# Patient Record
Sex: Male | Born: 1965 | Race: White | Hispanic: No | Smoking: Never smoker
Health system: Southern US, Community
[De-identification: ages and names within clinical notes are randomized; demographics above are authoritative.]

## PROBLEM LIST (undated history)

## (undated) DIAGNOSIS — D509 Iron deficiency anemia, unspecified: Secondary | ICD-10-CM

## (undated) DIAGNOSIS — C801 Malignant (primary) neoplasm, unspecified: Secondary | ICD-10-CM

## (undated) DIAGNOSIS — K4020 Bilateral inguinal hernia, without obstruction or gangrene, not specified as recurrent: Secondary | ICD-10-CM

## (undated) DIAGNOSIS — D649 Anemia, unspecified: Secondary | ICD-10-CM

## (undated) HISTORY — PX: EYE SURGERY: SHX253

## (undated) HISTORY — DX: Iron deficiency anemia, unspecified: D50.9

## (undated) HISTORY — PX: HERNIA REPAIR: SHX51

---
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
## 2000-04-27 ENCOUNTER — Encounter: Payer: Self-pay | Admitting: Family Medicine

## 2000-04-27 ENCOUNTER — Encounter: Admission: RE | Admit: 2000-04-27 | Discharge: 2000-04-27 | Payer: Self-pay | Admitting: Family Medicine

## 2007-05-14 ENCOUNTER — Emergency Department (HOSPITAL_COMMUNITY): Admission: EM | Admit: 2007-05-14 | Discharge: 2007-05-14 | Payer: Self-pay | Admitting: Emergency Medicine

## 2011-04-27 NOTE — Consult Note (Signed)
Paul Mason, Paul Mason              ACCOUNT NO.:  [id number]   MEDICAL RECORD NO.:  [id number]          PATIENT TYPE:  EMS   LOCATION:  [facility]                         FACILITY:  [facility]   PHYSICIAN:  Andres Shad. Rudean Curt, MD     DATE OF BIRTH:  06-01-1966   DATE OF CONSULTATION:  05/14/2007  DATE OF DISCHARGE:                                 CONSULTATION   CHIEF COMPLAINT:  Chest pain.   HISTORY OF PRESENT ILLNESS:  Mr. Schleifer is a previously well 45 year old  male who presented to the emergency department complaining of chest  pain.  He was running this morning, and after about 40 minutes, he began  to experience some mild chest tightness associated with a cough and some  discomfort in his throat.  Several minutes later, he developed severe  chest tightness with worse pain in his throat and felt like he was  coughing up clear liquid.  His pain lasted for approximately an hour and  was associated with some degree of shortness of breath.  He presented to  the emergency department for further evaluation and the pain began to  subside at that point.  It was particularly alleviated by a dose of  morphine and Zofran that were administered in the emergency department.   REVIEW OF SYSTEMS:  Negative for radiation of pain.  Negative for  vomiting.  Positive for nausea.  Indeterminate for diaphoresis, because  the patient was running.  Negative for chills.  Positive for mild  abdominal pain which the patient has been developing for 2-3 days.  Positive for cough which the patient has also been developing for the  last 2-3 days.   PAST MEDICAL HISTORY:  Negative.  The patient was screened for  hypercholesterolemia a year ago, and this was negative.  He does not  have a history of hypertension or diabetes.  He does not have a history  of asthma.   FAMILY HISTORY:  Both of his grandmothers had a stroke in their 61s.  There was no history of coronary artery disease in the family at all at  any age.   SOCIAL HISTORY:  No smoking, social drinking, no drug use.  The patient  drinks a lot of caffeine, but he has not had any caffeinated beverages  today.   PHYSICAL EXAMINATION:  VITAL SIGNS:  Afebrile, pulse 84, blood pressure  in the left arm 114/77, blood pressure in the right arm 112/77, oxygen  saturation 96% on room air.  GENERAL:  Well-appearing male who appeared his stated age.  HEENT:  Mucous membranes moist.  Oropharynx was quite erythematous.  There was no adenopathy.  NECK:  Supple.  LUNGS:  Clear to auscultation bilaterally.  CARDIOVASCULAR:  Regular, normal S1, S2.  No murmurs, rubs or gallops.  ABDOMEN:  Normal bowel sounds, soft, mildly tender in the epigastrium  and right upper quadrant.  No organomegaly.  EXTREMITIES:  Warm, well perfused.  No edema.   STUDIES:  Two sets of cardiac markers were negative.  Chest x-ray showed  no acute disease.  Serum electrolytes were negative.  Electrocardiogram  showed  normal sinus rhythm.   IMPRESSION:  This is a 45 year old male who has no cardiac risk factors  who presents with atypical chest pain in the context of what appears to  be a viral infection.  It is possible that he is having some  gastroesophageal reflux in association with abdominal pain and mild  cough.   PLAN:  He developed tightness in his chest and reflux-type symptoms  while running.  At this point, I will give the patient a prescription  for Prilosec 20 mg to be taken twice daily.  I have instructed him to  follow up with Dr. Nicholos Johns in the office tomorrow and to return to medical  attention if his symptoms become more severe.      Andres Shad. Rudean Curt, MD  Electronically Signed     PML/MEDQ  D:  05/14/2007  T:  05/14/2007  Job:  [id number]   cc:   Molly Maduro A. Nicholos Johns, M.D.

## 2011-09-30 LAB — I-STAT 8, (EC8 V) (CONVERTED LAB)
Acid-Base Excess: 1
BUN: 20
Bicarbonate: 26.8 — ABNORMAL HIGH
Chloride: 107
Glucose, Bld: 102 — ABNORMAL HIGH
HCT: 45
Hemoglobin: 15.3
Operator id: 270111
Potassium: 4.2
Sodium: 142
TCO2: 28
pCO2, Ven: 46
pH, Ven: 7.373 — ABNORMAL HIGH

## 2011-09-30 LAB — POCT CARDIAC MARKERS
CKMB, poc: 1.3
CKMB, poc: 1.3
Myoglobin, poc: 64.7
Myoglobin, poc: 97.1
Operator id: 196461
Operator id: 270111
Troponin i, poc: <0.05
Troponin i, poc: <0.05

## 2011-09-30 LAB — POCT I-STAT CREATININE
Creatinine, Ser: 1
Operator id: 270111

## 2011-09-30 LAB — D-DIMER, QUANTITATIVE: D-Dimer, Quant: 0.55 — ABNORMAL HIGH

## 2017-03-10 ENCOUNTER — Other Ambulatory Visit: Payer: Self-pay | Admitting: General Surgery

## 2017-06-03 NOTE — Patient Instructions (Addendum)
TAHER VANNOTE  06/03/2017   Your procedure is scheduled on: 06-08-17   Report to [facility] Main  Entrance Take [place] Elevators to 3rd floor to [place] at 6:30 AM.   Call this number if you have problems the morning of surgery [phone]    Remember: ONLY 1 PERSON MAY GO WITH YOU TO SHORT STAY TO GET  READY MORNING OF [place].  Do not eat food or drink liquids :After Midnight.     Take these medicines the morning of surgery with A SIP OF WATER: None                                You may not have any metal on your body including hair pins and              piercings  Do not wear jewelry, make-up, lotions, powders or perfumes, deodorant                          Men may shave face and neck.   Do not bring valuables to the hospital. [place].  Contacts, dentures or bridgework may not be worn into surgery.       Patients discharged the day of surgery will not be allowed to drive home.  Name and phone number of your driver: Mom-Peggy [phone]               Please read over the following fact sheets you were given: _____________________________________________________________________             [facility] - Preparing for Surgery Before surgery, you can play an important role.  Because skin is not sterile, your skin needs to be as free of germs as possible.  You can reduce the number of germs on your skin by washing with CHG (chlorahexidine gluconate) soap before surgery.  CHG is an antiseptic cleaner which kills germs and bonds with the skin to continue killing germs even after washing. Please DO NOT use if you have an allergy to CHG or antibacterial soaps.  If your skin becomes reddened/irritated stop using the CHG and inform your nurse when you arrive at Short Stay. Do not shave (including legs and underarms) for at least 48 hours prior to the first CHG shower.  You may shave your  face/neck. Please follow these instructions carefully:  1.  Shower with CHG Soap the night before surgery and the  morning of Surgery.  2.  If you choose to wash your hair, wash your hair first as usual with your  normal  shampoo.  3.  After you shampoo, rinse your hair and body thoroughly to remove the  shampoo.                           4.  Use CHG as you would any other liquid soap.  You can apply chg directly  to the skin and wash                       Gently with a scrungie or clean washcloth.  5.  Apply the CHG Soap to your body  ONLY FROM THE NECK DOWN.   Do not use on face/ open                           Wound or open sores. Avoid contact with eyes, ears mouth and genitals (private parts).                       Wash face,  Genitals (private parts) with your normal soap.             6.  Wash thoroughly, paying special attention to the area where your surgery  will be performed.  7.  Thoroughly rinse your body with warm water from the neck down.  8.  DO NOT shower/wash with your normal soap after using and rinsing off  the CHG Soap.                9.  Pat yourself dry with a clean towel.            10.  Wear clean pajamas.            11.  Place clean sheets on your bed the night of your first shower and do not  sleep with pets. Day of Surgery : Do not apply any lotions/deodorants the morning of surgery.  Please wear clean clothes to the hospital/surgery center.  FAILURE TO FOLLOW THESE INSTRUCTIONS MAY RESULT IN THE CANCELLATION OF YOUR SURGERY PATIENT SIGNATURE_________________________________  NURSE SIGNATURE__________________________________  ________________________________________________________________________

## 2017-06-05 NOTE — H&P (Signed)
[name] Location: [facility] Patient #: [id number] DOB: 04-27-1966 Single / Language: Cleophus Molt / Race: White Male       History of Present Illness       The patient is a 51 year old male who presents with a complaint of bilateral inguinal hernias. This is a very pleasant 51 year old gentleman, referred by Dr. Natividad Brood for a symptomatic right inguinal hernia. On exam he actually has bilateral inguinal hernias      He gives a 3 to four-month history of a right groin bulge and some brief shooting pains. No history of incarceration. No prior hernia surgery. Only prior surgery he's had his vasectomy.      Comorbidities are minimal. No major medical problems. Vasectomy only Family history reveals mother living with coronary artery disease and CABG. Father died in a farming accident crushed by his tractor. Social history reveals he is single. Lives in [place]. Has 2 children. 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 Denies tobacco. Drinks alcohol occasionally     We had a long discussion about his hernias. I told him he had bilateral inguinal hernias and that repair would be required at some point electively in the future. Since he's becoming symptomatic he's thinking he wants to do this in the summer when he is out of school and that would be appropriate. When a long talk about laparoscopic technique and open technique. I told him that he is an appropriate candidate for both and I couldn't do it either way. I discussed the differences in an short-term pain, recurrence rates and technique in general. Disability from sports would be 4-5 weeks and he understands all of this. He says he can do this in the summer. Right now his preference is bilateral open repair. If he changes his mind that would be fine.      We will schedule him for bilateral inguinal hernia repair, open technique, implantation of mesh probably in late June or early July.  Bilateral TAPP blocks I discussed the indications, details, techniques, and numerous risk of the surgery with him. He is aware of the risk of bleeding, infection, recurrence, nerve damage with chronic pain, injury to the testicle or bladder or bowel, urinary retention and other unforeseen problems. He understands these issues well. All of his questions are answered. He agrees with this plan.   Allergies PenicillAMINE *MISCELLANEOUS THERAPEUTIC CLASSES*  Hives. Allergies Reconciled   Medication History No Current Medications Medications Reconciled  Vitals  Weight: 208.6 lb Height: 69in Body Surface Area: 2.1 m Body Mass Index: 30.8 kg/m  Temp.: 98.33F  Pulse: 70 (Regular)  BP: 122/72 (Sitting, Left Arm, Standard)    Physical Exam  General Mental Status-Alert. General Appearance-Consistent with stated age. Hydration-Well hydrated. Voice-Normal.  Head and Neck Head-normocephalic, atraumatic with no lesions or palpable masses. Trachea-midline. Thyroid Gland Characteristics - normal size and consistency.  Eye Eyeball - Bilateral-Extraocular movements intact. Sclera/Conjunctiva - Bilateral-No scleral icterus.  Chest and Lung Exam Chest and lung exam reveals -quiet, even and easy respiratory effort with no use of accessory muscles and on auscultation, normal breath sounds, no adventitious sounds and normal vocal resonance. Inspection Chest Wall - Normal. Back - normal.  Cardiovascular Cardiovascular examination reveals -normal heart sounds, regular rate and rhythm with no murmurs and normal pedal pulses bilaterally.  Abdomen Inspection Inspection of the abdomen reveals - No Hernias. Skin - Scar - no surgical scars. Palpation/Percussion Palpation and Percussion of the abdomen reveal - Soft,  Non Tender, No Rebound tenderness, No Rigidity (guarding) and No hepatosplenomegaly. Auscultation Auscultation of the abdomen reveals - Bowel  sounds normal. Note: Umbilicus normal.   Male Genitourinary Note: Bilateral inguinal hernias. These are obvious but not large. They do not extend past the external inguinal ring. Nontender. Reducible. No adenopathy. Penis scrotum and testes are normal. No scrotal mass.   Neurologic Neurologic evaluation reveals -alert and oriented x 3 with no impairment of recent or remote memory. Mental Status-Normal.  Musculoskeletal Normal Exam - Left-Upper Extremity Strength Normal and Lower Extremity Strength Normal. Normal Exam - Right-Upper Extremity Strength Normal and Lower Extremity Strength Normal.  Lymphatic Head & Neck  General Head & Neck Lymphatics: Bilateral - Description - Normal. Axillary  General Axillary Region: Bilateral - Description - Normal. Tenderness - Non Tender. Femoral & Inguinal  Generalized Femoral & Inguinal Lymphatics: Bilateral - Description - Normal. Tenderness - Non Tender.    Assessment & Plan  BILATERAL INGUINAL HERNIA WITHOUT OBSTRUCTION OR GANGRENE, RECURRENCE NOT SPECIFIED (K40.20)  You have noticed a bulge and some intermittent pain in your right groin On examination you actually have bilateral inguinal hernias These are reducible so there is no emergency We have discussed the techniques of open hernia repair and laparoscopic repair We have compare the differences in pain and recurrence rates Temporary disability is about 4-5 weeks regardless of technique I am happy to perform either technique.  you stated that you would like to have this surgery performed At this time you're a candidate for either technique You have stated that you would like to have open repairs because of the lower recurrence rate, and that is appropriate  Please read the patient information booklet that I gave you    [name]. Dalbert Batman, M.D., [facility] General and Minimally invasive Surgery Breast and Colorectal Surgery Office:    [phone] Pager:   [phone]

## 2017-06-06 ENCOUNTER — Encounter (HOSPITAL_COMMUNITY): Payer: Self-pay

## 2017-06-06 ENCOUNTER — Encounter (HOSPITAL_COMMUNITY)
Admission: RE | Admit: 2017-06-06 | Discharge: 2017-06-06 | Disposition: A | Discharge status: Home / Self Care | Payer: BC Managed Care – PPO | Source: Ambulatory Visit | Attending: General Surgery | Admitting: General Surgery

## 2017-06-06 DIAGNOSIS — Z8249 Family history of ischemic heart disease and other diseases of the circulatory system: Secondary | ICD-10-CM | POA: Diagnosis not present

## 2017-06-06 DIAGNOSIS — Z880 Allergy status to penicillin: Secondary | ICD-10-CM | POA: Diagnosis not present

## 2017-06-06 DIAGNOSIS — Z9852 Vasectomy status: Secondary | ICD-10-CM | POA: Diagnosis not present

## 2017-06-06 DIAGNOSIS — K4020 Bilateral inguinal hernia, without obstruction or gangrene, not specified as recurrent: Secondary | ICD-10-CM | POA: Diagnosis not present

## 2017-06-06 LAB — COMPREHENSIVE METABOLIC PANEL
ALK PHOS: 38 U/L (ref 38–126)
ALT: 14 U/L — ABNORMAL LOW (ref 17–63)
ANION GAP: 8 (ref 5–15)
AST: 18 U/L (ref 15–41)
Albumin: 4.2 g/dL (ref 3.5–5.0)
BUN: 24 mg/dL — ABNORMAL HIGH (ref 6–20)
CALCIUM: 8.4 mg/dL — AB (ref 8.9–10.3)
CO2: 26 mmol/L (ref 22–32)
Chloride: 106 mmol/L (ref 101–111)
Creatinine, Ser: 1.27 mg/dL — ABNORMAL HIGH (ref 0.61–1.24)
GFR calc non Af Amer: >60 mL/min (ref >60)
Glucose, Bld: 114 mg/dL — ABNORMAL HIGH (ref 65–99)
Potassium: 4.4 mmol/L (ref 3.5–5.1)
SODIUM: 140 mmol/L (ref 135–145)
Total Bilirubin: 0.5 mg/dL (ref 0.3–1.2)
Total Protein: 7 g/dL (ref 6.5–8.1)

## 2017-06-06 LAB — CBC WITH DIFFERENTIAL/PLATELET
Basophils Absolute: 0 10*3/uL (ref 0.0–0.1)
Basophils Relative: 0 %
EOS ABS: 0.2 10*3/uL (ref 0.0–0.7)
EOS PCT: 4 %
HCT: 42.1 % (ref 39.0–52.0)
HEMOGLOBIN: 14.7 g/dL (ref 13.0–17.0)
LYMPHS ABS: 0.7 10*3/uL (ref 0.7–4.0)
Lymphocytes Relative: 13 %
MCH: 29.1 pg (ref 26.0–34.0)
MCHC: 34.9 g/dL (ref 30.0–36.0)
MCV: 83.4 fL (ref 78.0–100.0)
MONOS PCT: 6 %
Monocytes Absolute: 0.3 10*3/uL (ref 0.1–1.0)
Neutro Abs: 4 10*3/uL (ref 1.7–7.7)
Neutrophils Relative %: 77 %
PLATELETS: 134 10*3/uL — AB (ref 150–400)
RBC: 5.05 MIL/uL (ref 4.22–5.81)
RDW: 13.2 % (ref 11.5–15.5)
WBC: 5.2 10*3/uL (ref 4.0–10.5)

## 2017-06-06 NOTE — Progress Notes (Signed)
06-06-17 CMP routed to Dr. Dalbert Batman for review.

## 2017-06-08 ENCOUNTER — Ambulatory Visit (HOSPITAL_COMMUNITY): Payer: BC Managed Care – PPO | Admitting: Anesthesiology

## 2017-06-08 ENCOUNTER — Ambulatory Visit (HOSPITAL_COMMUNITY)
Admission: RE | Admit: 2017-06-08 | Discharge: 2017-06-08 | Disposition: A | Discharge status: Home / Self Care | Payer: BC Managed Care – PPO | Source: Ambulatory Visit | Attending: General Surgery | Admitting: General Surgery

## 2017-06-08 ENCOUNTER — Encounter (HOSPITAL_COMMUNITY)
Admission: RE | Disposition: A | Discharge status: Home / Self Care | Payer: Self-pay | Source: Ambulatory Visit | Attending: General Surgery

## 2017-06-08 ENCOUNTER — Encounter (HOSPITAL_COMMUNITY): Payer: Self-pay | Admitting: *Deleted

## 2017-06-08 DIAGNOSIS — Z9852 Vasectomy status: Secondary | ICD-10-CM | POA: Insufficient documentation

## 2017-06-08 DIAGNOSIS — K4020 Bilateral inguinal hernia, without obstruction or gangrene, not specified as recurrent: Secondary | ICD-10-CM | POA: Diagnosis not present

## 2017-06-08 DIAGNOSIS — Z8249 Family history of ischemic heart disease and other diseases of the circulatory system: Secondary | ICD-10-CM | POA: Insufficient documentation

## 2017-06-08 DIAGNOSIS — Z880 Allergy status to penicillin: Secondary | ICD-10-CM | POA: Insufficient documentation

## 2017-06-08 HISTORY — PX: INSERTION OF MESH: SHX5868

## 2017-06-08 HISTORY — PX: INGUINAL HERNIA REPAIR: SHX194

## 2017-06-08 HISTORY — DX: Bilateral inguinal hernia, without obstruction or gangrene, not specified as recurrent: K40.20

## 2017-06-08 SURGERY — REPAIR, HERNIA, INGUINAL, BILATERAL, ADULT
Anesthesia: General | Laterality: Bilateral

## 2017-06-08 MED ORDER — ROCURONIUM BROMIDE 50 MG/5ML IV SOSY
PREFILLED_SYRINGE | INTRAVENOUS | Status: AC
  Filled 2017-06-08: qty 10

## 2017-06-08 MED ORDER — MIDAZOLAM HCL 2 MG/2ML IJ SOLN
INTRAMUSCULAR | Status: AC
  Filled 2017-06-08: qty 2

## 2017-06-08 MED ORDER — PROPOFOL 10 MG/ML IV BOLUS
INTRAVENOUS | Status: DC | PRN
  Administered 2017-06-08: 50 mg via INTRAVENOUS
  Administered 2017-06-08: 150 mg via INTRAVENOUS

## 2017-06-08 MED ORDER — DEXAMETHASONE SODIUM PHOSPHATE 4 MG/ML IJ SOLN
INTRAMUSCULAR | Status: DC | PRN
  Administered 2017-06-08: 10 mg via INTRAVENOUS

## 2017-06-08 MED ORDER — ROCURONIUM BROMIDE 50 MG/5ML IV SOSY
PREFILLED_SYRINGE | INTRAVENOUS | Status: AC
  Filled 2017-06-08: qty 5

## 2017-06-08 MED ORDER — HYDROCODONE-ACETAMINOPHEN 5-325 MG PO TABS: 1.0000 | ORAL_TABLET | ORAL | 0 refills | Status: DC | PRN

## 2017-06-08 MED ORDER — SUGAMMADEX SODIUM 200 MG/2ML IV SOLN
INTRAVENOUS | Status: AC
  Filled 2017-06-08: qty 2

## 2017-06-08 MED ORDER — 0.9 % SODIUM CHLORIDE (POUR BTL) OPTIME
TOPICAL | Status: DC | PRN
  Administered 2017-06-08: 1000 mL

## 2017-06-08 MED ORDER — LIDOCAINE-EPINEPHRINE 1 %-1:100000 IJ SOLN
INTRAMUSCULAR | Status: AC
  Filled 2017-06-08: qty 1

## 2017-06-08 MED ORDER — CEFAZOLIN SODIUM-DEXTROSE 2-4 GM/100ML-% IV SOLN
2.0000 g | INTRAVENOUS | Status: AC
  Administered 2017-06-08: 2 g via INTRAVENOUS
  Filled 2017-06-08: qty 100

## 2017-06-08 MED ORDER — SUGAMMADEX SODIUM 200 MG/2ML IV SOLN
INTRAVENOUS | Status: DC | PRN
  Administered 2017-06-08: 200 mg via INTRAVENOUS

## 2017-06-08 MED ORDER — MIDAZOLAM HCL 5 MG/5ML IJ SOLN
INTRAMUSCULAR | Status: DC | PRN
  Administered 2017-06-08 (×4): 1 mg via INTRAVENOUS

## 2017-06-08 MED ORDER — MEPERIDINE HCL 50 MG/ML IJ SOLN: 6.2500 mg | INTRAMUSCULAR | Status: DC | PRN

## 2017-06-08 MED ORDER — ONDANSETRON HCL 4 MG/2ML IJ SOLN
INTRAMUSCULAR | Status: AC
  Filled 2017-06-08: qty 2

## 2017-06-08 MED ORDER — PROPOFOL 10 MG/ML IV BOLUS
INTRAVENOUS | Status: AC
  Filled 2017-06-08: qty 20

## 2017-06-08 MED ORDER — ROCURONIUM BROMIDE 50 MG/5ML IV SOSY
PREFILLED_SYRINGE | INTRAVENOUS | Status: DC | PRN
  Administered 2017-06-08: 50 mg via INTRAVENOUS
  Administered 2017-06-08: 10 mg via INTRAVENOUS
  Administered 2017-06-08: 20 mg via INTRAVENOUS
  Administered 2017-06-08: 30 mg via INTRAVENOUS

## 2017-06-08 MED ORDER — LACTATED RINGERS IV SOLN
INTRAVENOUS | Status: DC
  Administered 2017-06-08 (×2): via INTRAVENOUS

## 2017-06-08 MED ORDER — LACTATED RINGERS IV SOLN: INTRAVENOUS | Status: DC

## 2017-06-08 MED ORDER — PROMETHAZINE HCL 25 MG/ML IJ SOLN: 6.2500 mg | INTRAMUSCULAR | Status: DC | PRN

## 2017-06-08 MED ORDER — FENTANYL CITRATE (PF) 100 MCG/2ML IJ SOLN
INTRAMUSCULAR | Status: DC | PRN
  Administered 2017-06-08: 100 ug via INTRAVENOUS
  Administered 2017-06-08 (×6): 50 ug via INTRAVENOUS

## 2017-06-08 MED ORDER — HYDROMORPHONE HCL 1 MG/ML IJ SOLN
0.2500 mg | INTRAMUSCULAR | Status: DC | PRN
  Filled 2017-06-08: qty 0.5

## 2017-06-08 MED ORDER — BUPIVACAINE-EPINEPHRINE (PF) 0.5% -1:200000 IJ SOLN
INTRAMUSCULAR | Status: DC | PRN
  Administered 2017-06-08 (×2): 25 mL

## 2017-06-08 MED ORDER — FENTANYL CITRATE (PF) 250 MCG/5ML IJ SOLN
INTRAMUSCULAR | Status: AC
  Filled 2017-06-08: qty 5

## 2017-06-08 MED ORDER — BUPIVACAINE-EPINEPHRINE (PF) 0.5% -1:200000 IJ SOLN
INTRAMUSCULAR | Status: AC
  Filled 2017-06-08: qty 30

## 2017-06-08 MED ORDER — BUPIVACAINE-EPINEPHRINE 0.5% -1:200000 IJ SOLN
INTRAMUSCULAR | Status: DC | PRN
  Administered 2017-06-08: 7 mL

## 2017-06-08 MED ORDER — OXYCODONE HCL 5 MG PO TABS
5.0000 mg | ORAL_TABLET | Freq: Once | ORAL | Status: AC
  Administered 2017-06-08: 5 mg via ORAL
  Filled 2017-06-08: qty 1

## 2017-06-08 MED ORDER — FENTANYL CITRATE (PF) 100 MCG/2ML IJ SOLN
INTRAMUSCULAR | Status: AC
  Filled 2017-06-08: qty 2

## 2017-06-08 MED ORDER — DEXAMETHASONE SODIUM PHOSPHATE 10 MG/ML IJ SOLN
INTRAMUSCULAR | Status: AC
  Filled 2017-06-08: qty 1

## 2017-06-08 MED ORDER — ONDANSETRON HCL 4 MG/2ML IJ SOLN
INTRAMUSCULAR | Status: DC | PRN
Start: 2017-06-08 — End: 2017-06-08
  Administered 2017-06-08: 4 mg via INTRAVENOUS

## 2017-06-08 SURGICAL SUPPLY — 40 items
BENZOIN TINCTURE PRP APPL 2/3 (GAUZE/BANDAGES/DRESSINGS) ×3 IMPLANT
BLADE HEX COATED 2.75 (ELECTRODE) ×3 IMPLANT
BLADE SURG 15 STRL LF DISP TIS (BLADE) ×1 IMPLANT
BLADE SURG 15 STRL SS (BLADE) ×2
CLOSURE WOUND 1/2 X4 (GAUZE/BANDAGES/DRESSINGS) ×1
COVER SURGICAL LIGHT HANDLE (MISCELLANEOUS) ×3 IMPLANT
DECANTER SPIKE VIAL GLASS SM (MISCELLANEOUS) ×3 IMPLANT
DERMABOND ADVANCED (GAUZE/BANDAGES/DRESSINGS) ×2
DERMABOND ADVANCED .7 DNX12 (GAUZE/BANDAGES/DRESSINGS) ×1 IMPLANT
DRAIN PENROSE 18X1/2 LTX STRL (DRAIN) ×3 IMPLANT
DRAPE LAPAROTOMY TRNSV 102X78 (DRAPE) ×3 IMPLANT
ELECT PENCIL ROCKER SW 15FT (MISCELLANEOUS) ×3 IMPLANT
ELECT REM PT RETURN 15FT ADLT (MISCELLANEOUS) ×3 IMPLANT
GAUZE SPONGE 4X4 12PLY STRL (GAUZE/BANDAGES/DRESSINGS) ×3 IMPLANT
GLOVE EUDERMIC 7 POWDERFREE (GLOVE) ×3 IMPLANT
GOWN STRL REUS W/TWL XL LVL3 (GOWN DISPOSABLE) ×6 IMPLANT
KIT BASIN OR (CUSTOM PROCEDURE TRAY) ×3 IMPLANT
MESH ULTRAPRO 3X6 7.6X15CM (Mesh General) ×6 IMPLANT
NEEDLE HYPO 25X1 1.5 SAFETY (NEEDLE) ×3 IMPLANT
PACK BASIC VI WITH GOWN DISP (CUSTOM PROCEDURE TRAY) ×3 IMPLANT
SPONGE LAP 4X18 X RAY DECT (DISPOSABLE) ×3 IMPLANT
STAPLER VISISTAT 35W (STAPLE) IMPLANT
STRIP CLOSURE SKIN 1/2X4 (GAUZE/BANDAGES/DRESSINGS) ×2 IMPLANT
SUT MNCRL AB 4-0 PS2 18 (SUTURE) ×6 IMPLANT
SUT PROLENE 2 0 CT2 30 (SUTURE) ×18 IMPLANT
SUT SILK 2 0 (SUTURE) ×2
SUT SILK 2 0 SH (SUTURE) IMPLANT
SUT SILK 2-0 18XBRD TIE 12 (SUTURE) ×1 IMPLANT
SUT VIC AB 2-0 SH 27 (SUTURE) ×12
SUT VIC AB 2-0 SH 27X BRD (SUTURE) ×6 IMPLANT
SUT VIC AB 3-0 SH 18 (SUTURE) ×3 IMPLANT
SUT VIC AB 3-0 SH 27 (SUTURE) ×6
SUT VIC AB 3-0 SH 27XBRD (SUTURE) ×3 IMPLANT
SUT VICRYL 2 0 18  UND BR (SUTURE)
SUT VICRYL 2 0 18 UND BR (SUTURE) IMPLANT
SYR 20CC LL (SYRINGE) ×3 IMPLANT
SYR BULB IRRIGATION 50ML (SYRINGE) ×3 IMPLANT
TOWEL OR 17X26 10 PK STRL BLUE (TOWEL DISPOSABLE) ×3 IMPLANT
TOWEL OR NON WOVEN STRL DISP B (DISPOSABLE) ×3 IMPLANT
YANKAUER SUCT BULB TIP 10FT TU (MISCELLANEOUS) ×3 IMPLANT

## 2017-06-08 NOTE — Anesthesia Procedure Notes (Signed)
Anesthesia Regional Block: TAP block   Pre-Anesthetic Checklist: ,, timeout performed, Correct Patient, Correct Site, Correct Laterality, Correct Procedure, Correct Position, site marked, Risks and benefits discussed,  Surgical consent,  Pre-op evaluation,  At surgeon's request and post-op pain management  Laterality: Left  Prep: chloraprep       Needles:  Injection technique: Single-shot  Needle Type: Echogenic Needle     Needle Length: 9cm  Needle Gauge: 21     Additional Needles:   Procedures: ultrasound guided,,,,,,,,  Narrative:  Start time: 06/08/2017 8:10 AM End time: 06/08/2017 8:15 AM Injection made incrementally with aspirations every 5 mL.  Performed by: Personally  Anesthesiologist: Suella Broad D  Additional Notes: Tolerated well.

## 2017-06-08 NOTE — Discharge Instructions (Signed)
General Anesthesia, Adult, Care After These instructions provide you with information about caring for yourself after your procedure. Your health care provider may also give you more specific instructions. Your treatment has been planned according to current medical practices, but problems sometimes occur. Call your health care provider if you have any problems or questions after your procedure. What can I expect after the procedure? After the procedure, it is common to have:  Vomiting.  A sore throat.  Mental slowness.  It is common to feel:  Nauseous.  Cold or shivery.  Sleepy.  Tired.  Sore or achy, even in parts of your body where you did not have surgery.  Follow these instructions at home: For at least 24 hours after the procedure:  Do not: ? Participate in activities where you could fall or become injured. ? Drive. ? Use heavy machinery. ? Drink alcohol. ? Take sleeping pills or medicines that cause drowsiness. ? Make important decisions or sign legal documents. ? Take care of children on your own.  Rest. Eating and drinking  If you vomit, drink water, juice, or soup when you can drink without vomiting.  Drink enough fluid to keep your urine clear or pale yellow.  Make sure you have little or no nausea before eating solid foods.  Follow the diet recommended by your health care provider. General instructions  Have a responsible adult stay with you until you are awake and alert.  Return to your normal activities as told by your health care provider. Ask your health care provider what activities are safe for you.  Take over-the-counter and prescription medicines only as told by your health care provider.  If you smoke, do not smoke without supervision.  Keep all follow-up visits as told by your health care provider. This is important. Contact a health care provider if:  You continue to have nausea or vomiting at home, and medicines are not helpful.  You  cannot drink fluids or start eating again.  You cannot urinate after 8-12 hours.  You develop a skin rash.  You have fever.  You have increasing redness at the site of your procedure. Get help right away if:  You have difficulty breathing.  You have chest pain.  You have unexpected bleeding.  You feel that you are having a life-threatening or urgent problem. This information is not intended to replace advice given to you by your health care provider. Make sure you discuss any questions you have with your health care provider. Document Released: 03/07/2001 Document Revised: 05/03/2016 Document Reviewed: 11/13/2015 Elsevier Interactive Patient Education  2018 [place] _______[facility]  UMBILICAL OR INGUINAL HERNIA REPAIR: POST OP INSTRUCTIONS  Always review your discharge instruction sheet given to you by the facility where your surgery was performed. IF YOU HAVE DISABILITY OR FAMILY LEAVE FORMS, YOU MUST BRING THEM TO THE OFFICE FOR PROCESSING.   DO NOT GIVE THEM TO YOUR DOCTOR.  1. A  prescription for pain medication may be given to you upon discharge.  Take your pain medication as prescribed, if needed.  If narcotic pain medicine is not needed, then you may take acetaminophen (Tylenol) or ibuprofen (Advil) as needed. 2. Take your usually prescribed medications unless otherwise directed. If you need a refill on your pain medication, please contact your pharmacy.  They will contact our office to request authorization. Prescriptions will not be filled after 5 pm or on week-ends. 3. You should follow a light diet the first 24 hours  after arrival home, such as soup and crackers, etc.  Be sure to include lots of fluids daily.  Resume your normal diet the day after surgery. 4.Most patients will experience some swelling and bruising around the umbilicus or in the groin and scrotum.  Ice packs and reclining will help.  Swelling and bruising can take several days  to resolve.  6. It is common to experience some constipation if taking pain medication after surgery.  Increasing fluid intake and taking a stool softener (such as Colace) will usually help or prevent this problem from occurring.  A mild laxative (Milk of Magnesia or Miralax) should be taken according to package directions if there are no bowel movements after 48 hours. 7. Unless discharge instructions indicate otherwise, you may remove your bandages 24-48 hours after surgery, and you may shower at that time.  You may have steri-strips (small skin tapes) in place directly over the incision.  These strips should be left on the skin for 7-10 days.  If your surgeon used skin glue on the incision, you may shower in 24 hours.  The glue will flake off over the next 2-3 weeks.  Any sutures or staples will be removed at the office during your follow-up visit. 8. ACTIVITIES:  You may resume regular (light) daily activities beginning the next day--such as daily self-care, walking, climbing stairs--gradually increasing activities as tolerated.  You may have sexual intercourse when it is comfortable.  Refrain from any heavy lifting or straining until approved by your doctor.  a.You may drive when you are no longer taking prescription pain medication, you can comfortably wear a seatbelt, and you can safely maneuver your car and apply brakes. b.RETURN TO WORK:   _____________________________________________  9.You should see your doctor in the office for a follow-up appointment approximately 2-3 weeks after your surgery.  Make sure that you call for this appointment within a day or two after you arrive home to insure a convenient appointment time. 10.OTHER INSTRUCTIONS: _____No sports or heavy lifting more than 20 pounds for 5 weeks.  .No driving for 0-27 days .take a stool softener twice a day to avoid constipation ..____________________    _____________________________________  WHEN TO CALL YOUR DOCTOR: 1. Fever  over 101.0 2. Inability to urinate 3. Nausea and/or vomiting 4. Extreme swelling or bruising 5. Continued bleeding from incision. 6. Increased pain, redness, or drainage from the incision  The clinic staff is available to answer your questions during regular business hours.  Please dont hesitate to call and ask to speak to one of the nurses for clinical concerns.  If you have a medical emergency, go to the nearest emergency room or call 911.  A surgeon from [facility] is always on call at the hospital   [address], [place], [place], [place]  [address] ?  [address], [place], [place]   [address] [phone] ? [phone] ? FAX [phone] Web site: [email or web address]

## 2017-06-08 NOTE — Anesthesia Procedure Notes (Signed)
Procedure Name: Intubation Date/Time: 06/08/2017 8:41 AM Performed by: Deliah Boston Pre-anesthesia Checklist: Patient identified, Emergency Drugs available, Suction available and Patient being monitored Patient Re-evaluated:Patient Re-evaluated prior to inductionOxygen Delivery Method: Circle system utilized Preoxygenation: Pre-oxygenation with 100% oxygen (easy mask) Intubation Type: IV induction Ventilation: Mask ventilation without difficulty Laryngoscope Size: Glidescope and 4 Grade View: Grade II Tube type: Oral Tube size: 7.5 mm Number of attempts: 2 Airway Equipment and Method: Stylet,  Oral airway,  Video-laryngoscopy and Rigid stylet Placement Confirmation: ETT inserted through vocal cords under direct vision,  positive ETCO2 and breath sounds checked- equal and bilateral Secured at: 22 (at lip) cm Tube secured with: Tape Dental Injury: Teeth and Oropharynx as per pre-operative assessment  Difficulty Due To: Difficulty was unanticipated and Difficult Airway- due to anterior larynx Comments: Easy mask, DVL x1 by CRNA with mac 4, no view, attempt to place bougie unsuccessful. DVL x 1 by MDA with mac 4 grade 3 view, unsuccessful intubation attempt. Pt ventilated with 100% O2. DVL with glidescope by MDA with grade 2 view. ETT placed, + bil breath sounds, + etco2. ETT secured at 22cm at the lip. Lips, teeth, and oral mucosa as pre op

## 2017-06-08 NOTE — Transfer of Care (Signed)
Immediate Anesthesia Transfer of Care Note  Patient: Paul Mason  Procedure(s) Performed: Procedure(s): OPEN REPAIR BILARTERAL INGUINAL HERNIA WITH MESH (Bilateral) INSERTION OF MESH (Bilateral)  Patient Location: PACU  Anesthesia Type:General and Regional  Level of Consciousness: Patient easily awoken, sedated, comfortable, cooperative, following commands, responds to stimulation.   Airway & Oxygen Therapy: Patient spontaneously breathing, ventilating well, oxygen via simple oxygen mask.  Post-op Assessment: Report given to PACU RN, vital signs reviewed and stable, moving all extremities.   Post vital signs: Reviewed and stable.  Complications: No apparent anesthesia complications  Last Vitals:  Vitals:   06/08/17 0623  BP: 140/87  Pulse: 87  Resp: 18  Temp: 36.6 C    Last Pain:  Vitals:   06/08/17 0753  TempSrc:   PainSc: 0-No pain      Patients Stated Pain Goal: 4 (32/02/33 4356)  Complications: No apparent anesthesia complications

## 2017-06-08 NOTE — Progress Notes (Signed)
Spoke to [place] in pharmacy . Verified to give.

## 2017-06-08 NOTE — Interval H&P Note (Signed)
History and Physical Interval Note:  06/08/2017 8:03 AM  Paul Mason  has presented today for surgery, with the diagnosis of bilateral inguinal hernias  The various methods of treatment have been discussed with the patient and family. After consideration of risks, benefits and other options for treatment, the patient has consented to  Procedure(s): OPEN REPAIR BILARTERAL INGUINAL HERNIA WITH MESH (Bilateral) INSERTION OF MESH (Bilateral) as a surgical intervention .  The patient's history has been reviewed, patient examined, no change in status, stable for surgery.  I have reviewed the patient's chart and labs.  Questions were answered to the patient's satisfaction.     [name]

## 2017-06-08 NOTE — Anesthesia Postprocedure Evaluation (Signed)
Anesthesia Post Note  Patient: Paul Mason  Procedure(s) Performed: Procedure(s) (LRB): OPEN REPAIR BILARTERAL INGUINAL HERNIA WITH MESH (Bilateral) INSERTION OF MESH (Bilateral)     Patient location during evaluation: PACU Anesthesia Type: General and Regional Level of consciousness: awake and alert Pain management: pain level controlled Vital Signs Assessment: post-procedure vital signs reviewed and stable Respiratory status: spontaneous breathing, nonlabored ventilation, respiratory function stable and patient connected to nasal cannula oxygen Cardiovascular status: blood pressure returned to baseline and stable Postop Assessment: no signs of nausea or vomiting Anesthetic complications: no    Last Vitals:  Vitals:   06/08/17 1100 06/08/17 1115  BP: (!) 157/97 (!) 146/90  Pulse: 77 76  Resp: 15 16  Temp:      Last Pain:  Vitals:   06/08/17 1115  TempSrc:   PainSc: 0-No pain                 Effie Berkshire

## 2017-06-08 NOTE — Addendum Note (Signed)
Addendum  created 06/08/17 1331 by Deliah Boston, CRNA   Charge Capture section accepted, Visit diagnoses modified

## 2017-06-08 NOTE — Anesthesia Preprocedure Evaluation (Signed)
Anesthesia Evaluation  Patient identified by MRN, date of birth, ID band Patient awake    Reviewed: Allergy & Precautions, NPO status , Patient's Chart, lab work & pertinent test results  Airway Mallampati: II  TM Distance: <3 FB Neck ROM: Full    Dental  (+) Teeth Intact, Dental Advisory Given   Pulmonary neg pulmonary ROS,    breath sounds clear to auscultation       Cardiovascular negative cardio ROS   Rhythm:Regular Rate:Normal     Neuro/Psych negative neurological ROS  negative psych ROS   GI/Hepatic Neg liver ROS, GERD  Medicated,  Endo/Other  negative endocrine ROS  Renal/GU negative Renal ROS  negative genitourinary   Musculoskeletal negative musculoskeletal ROS (+)   Abdominal   Peds negative pediatric ROS (+)  Hematology negative hematology ROS (+)   Anesthesia Other Findings   Reproductive/Obstetrics negative OB ROS                           Anesthesia Physical Anesthesia Plan  ASA: II  Anesthesia Plan: General   Post-op Pain Management:  Regional for Post-op pain   Induction: Intravenous  PONV Risk Score and Plan: 3 and Ondansetron, Dexamethasone, Propofol and Midazolam  Airway Management Planned: Oral ETT  Additional Equipment:   Intra-op Plan:   Post-operative Plan: Extubation in OR  Informed Consent: I have reviewed the patients History and Physical, chart, labs and discussed the procedure including the risks, benefits and alternatives for the proposed anesthesia with the patient or authorized representative who has indicated his/her understanding and acceptance.   Dental advisory given  Plan Discussed with: CRNA  Anesthesia Plan Comments:         Anesthesia Quick Evaluation

## 2017-06-08 NOTE — Progress Notes (Signed)
Patient states penicillin allergic reaction as a child. Reported to pharmacy

## 2017-06-08 NOTE — Anesthesia Procedure Notes (Signed)
Anesthesia Regional Block: TAP block   Pre-Anesthetic Checklist: ,, timeout performed, Correct Patient, Correct Site, Correct Laterality, Correct Procedure, Correct Position, site marked, Risks and benefits discussed,  Surgical consent,  Pre-op evaluation,  At surgeon's request and post-op pain management  Laterality: Right  Prep: chloraprep       Needles:  Injection technique: Single-shot  Needle Type: Echogenic Needle     Needle Length: 9cm  Needle Gauge: 21     Additional Needles:   Procedures: ultrasound guided,,,,,,,,  Narrative:  Start time: 06/08/2017 8:15 AM End time: 06/08/2017 8:20 AM Injection made incrementally with aspirations every 5 mL.  Performed by: Personally  Anesthesiologist: Suella Broad D  Additional Notes: Tolerated well.

## 2017-06-08 NOTE — Progress Notes (Signed)
AssistedDr. Smith Robert with right, left, ultrasound guided, adductor canal blocks. Side rails up, monitors on throughout procedure. See vital signs in flow sheet. Tolerated Procedure well.

## 2017-06-08 NOTE — Op Note (Signed)
Patient Name:           Paul Mason   Date of Surgery:        06/08/2017  Pre op Diagnosis:      Bilateral inguinal hernias  Post op Diagnosis:    Bilateral inguinal hernias  Procedure:                 Open repair bilateral inguinal hernias with mesh Karl Pock repair)  Surgeon:                     [name]. Dalbert Batman, M.D., FACS  Assistant:                      OR staff   Indication for Assistant: n/a   Operative Indications:   The patient is a 51 year old male who presents with a complaint of bilateral inguinal hernias. This is a very pleasant 51 year old gentleman, referred by Dr. Natividad Brood for a symptomatic right inguinal hernia. On exam he actually has bilateral inguinal hernias      He gives a 3 to four-month history of a right groin bulge and some brief shooting pains. No history of incarceration. No prior hernia surgery. Only prior surgery he's had is vasectomy.     We had a long discussion about his hernias. I told him he had bilateral inguinal hernias and that repair would be required at some point. Since he's becoming symptomatic he's thinking he wants to do this now. When a long talk about laparoscopic technique and open technique. I told him that he is an appropriate candidate for both and I couldn't do it either way. I discussed the differences in an short-term pain, recurrence rates and technique in general. Disability from sports would be 4-5 weeks and he understands all of this. He says he can do this in the summer. Right now his preference is bilateral open repair. If he changes his mind that would be fine.      We will schedule him for bilateral inguinal hernia repair, open technique, implantation of mesh probably in late June or early July. Bilateral TAPP blocks I discussed the indications, details, techniques, and numerous risk of the surgery with him. . He agrees with this plan.  Operative Findings:       He had large direct inguinal hernias on each  side.  He had a moderate size lipoma on each side.  There was no evidence of indirect hernia.  Procedure in Detail:          Following the induction of general endotracheal anesthesia a surgical timeout was performed and intravenous antibiotics were given.  TAPP  blocks had been performed bilaterally by the anesthesiologist.  The lower abdomen and genitalia were prepped and draped in a sterile fashion.  0.5% Marcaine with epinephrine was used as a local infiltration to the skin and subcutaneous tissues.     The technique was essentially identical on each side so I will dictate the technique only once.  Bilateral transverse inguinal incisions were made.  Dissection was carried down through subcutaneous tissue.  The external inguinal ring was identified.  The external oblique was incised in the direction of its fibers, opening up  the external inguinal ring.  The external oblique was dissected away from the underlying structures and self-retaining retractors were placed.  The cord structures were mobilized and encircled with a Penrose drain.  On each side there were large direct hernias which  were dissected away from the cord structures, reduced, and imbricated and oversewn with a running suture of 2-0 Vicryl.  On each side there was a moderate size lipoma associated with cord structures separated from the cord structures, dissected back to the internal ring and clamped divided and amputated and ligated with 2-0 Vicryl.  There was no indirect hernia on either side.  The ilioinguinal nerve was traced back to its emergence from the muscles laterally, clamped, divided, and ligated with 2-0 silk bilaterally.    The floor of the inguinal canal each side was repaired and reinforced with a 3" x 6" piece of ultra Pro mesh, trimmed the corners to accomodate the anatomy.  This was sutured in place with running sutures of 2-0 Prolene and interrupted mattress sutures of 2-0 Prolene.  The mesh was sutured so as to generously  overlap the fascia at the pubic tubercle, then along the inguinal ligament inferiorly.  Medially, superiorly, and superiolaterally several mattress sutures of Prolene were placed to secure the mesh.  The mesh was incised laterally so as to wraparound the cord structures at the internal ring.  The tails of the mesh were overlapped laterally.  Further sutures were placed lateral to the internal ring.  This provided very secure coverage and repair both medial and lateral to the internal ring but allowed adequate fingertip opening for the cord structures.    The wound was irrigated.  There was no bleeding.  The external oblique on each side was closed with a running suture of 2-0 Vicryl.  This was sutured so as to cover the cord structures.  Scarpa's fascia was closed with 3-0 Vicryl and the skin was closed each side with a running subcuticular 4-0 Monocryl and Dermabond.  The patient tolerated the procedure well was taken to PACU in stable condition.  EBL 30 mL or less.  Counts correct.  Complications none.     [name]. Dalbert Batman, M.D., FACS General and Minimally Invasive Surgery Breast and Colorectal Surgery   Addendum: I logged onto the [place] website and reviewed his prescription medication history  06/08/2017 10:40 AM

## 2017-06-16 ENCOUNTER — Encounter (HOSPITAL_COMMUNITY): Payer: Self-pay | Admitting: General Surgery

## 2018-12-13 DIAGNOSIS — C801 Malignant (primary) neoplasm, unspecified: Secondary | ICD-10-CM

## 2018-12-13 HISTORY — DX: Malignant (primary) neoplasm, unspecified: C80.1

## 2018-12-31 ENCOUNTER — Emergency Department (HOSPITAL_COMMUNITY)
Admission: EM | Admit: 2018-12-31 | Discharge: 2018-12-31 | Disposition: A | Discharge status: Home / Self Care | Payer: BC Managed Care – PPO | Attending: Emergency Medicine | Admitting: Emergency Medicine

## 2018-12-31 ENCOUNTER — Emergency Department (HOSPITAL_COMMUNITY): Payer: BC Managed Care – PPO

## 2018-12-31 ENCOUNTER — Encounter (HOSPITAL_COMMUNITY): Payer: Self-pay | Admitting: *Deleted

## 2018-12-31 ENCOUNTER — Other Ambulatory Visit: Payer: Self-pay

## 2018-12-31 DIAGNOSIS — C8590 Non-Hodgkin lymphoma, unspecified, unspecified site: Secondary | ICD-10-CM

## 2018-12-31 DIAGNOSIS — R590 Localized enlarged lymph nodes: Secondary | ICD-10-CM | POA: Diagnosis not present

## 2018-12-31 DIAGNOSIS — R0600 Dyspnea, unspecified: Secondary | ICD-10-CM | POA: Diagnosis not present

## 2018-12-31 DIAGNOSIS — R509 Fever, unspecified: Secondary | ICD-10-CM | POA: Diagnosis present

## 2018-12-31 DIAGNOSIS — Z79899 Other long term (current) drug therapy: Secondary | ICD-10-CM | POA: Diagnosis not present

## 2018-12-31 LAB — COMPREHENSIVE METABOLIC PANEL
ALBUMIN: 2.6 g/dL — AB (ref 3.5–5.0)
ALK PHOS: 73 U/L (ref 38–126)
ALT: 16 U/L (ref 0–44)
AST: 15 U/L (ref 15–41)
Anion gap: 13 (ref 5–15)
BILIRUBIN TOTAL: 0.3 mg/dL (ref 0.3–1.2)
BUN: 9 mg/dL (ref 6–20)
CALCIUM: 8.4 mg/dL — AB (ref 8.9–10.3)
CO2: 25 mmol/L (ref 22–32)
CREATININE: 0.93 mg/dL (ref 0.61–1.24)
Chloride: 103 mmol/L (ref 98–111)
GFR calc Af Amer: >60 mL/min (ref >60)
GFR calc non Af Amer: >60 mL/min (ref >60)
GLUCOSE: 115 mg/dL — AB (ref 70–99)
Potassium: 3.8 mmol/L (ref 3.5–5.1)
SODIUM: 141 mmol/L (ref 135–145)
TOTAL PROTEIN: 6.4 g/dL — AB (ref 6.5–8.1)

## 2018-12-31 LAB — CBC WITH DIFFERENTIAL/PLATELET
Abs Immature Granulocytes: 0.04 10*3/uL (ref 0.00–0.07)
Basophils Absolute: 0 10*3/uL (ref 0.0–0.1)
Basophils Relative: 1 %
EOS ABS: 0.1 10*3/uL (ref 0.0–0.5)
EOS PCT: 2 %
HEMATOCRIT: 28.9 % — AB (ref 39.0–52.0)
Hemoglobin: 8.6 g/dL — ABNORMAL LOW (ref 13.0–17.0)
Immature Granulocytes: 1 %
LYMPHS ABS: 0.6 10*3/uL — AB (ref 0.7–4.0)
Lymphocytes Relative: 10 %
MCH: 23.4 pg — AB (ref 26.0–34.0)
MCHC: 29.8 g/dL — AB (ref 30.0–36.0)
MCV: 78.5 fL — AB (ref 80.0–100.0)
MONO ABS: 0.3 10*3/uL (ref 0.1–1.0)
MONOS PCT: 6 %
Neutro Abs: 4.8 10*3/uL (ref 1.7–7.7)
Neutrophils Relative %: 80 %
Platelets: 326 10*3/uL (ref 150–400)
RBC: 3.68 MIL/uL — ABNORMAL LOW (ref 4.22–5.81)
RDW: 15.9 % — AB (ref 11.5–15.5)
WBC: 5.9 10*3/uL (ref 4.0–10.5)
nRBC: 0 % (ref 0.0–0.2)

## 2018-12-31 LAB — URINALYSIS, ROUTINE W REFLEX MICROSCOPIC
BILIRUBIN URINE: NEGATIVE
GLUCOSE, UA: NEGATIVE mg/dL
HGB URINE DIPSTICK: NEGATIVE
KETONES UR: NEGATIVE mg/dL
LEUKOCYTES UA: NEGATIVE
Nitrite: NEGATIVE
PH: 6 (ref 5.0–8.0)
Protein, ur: NEGATIVE mg/dL
Specific Gravity, Urine: 1.016 (ref 1.005–1.030)

## 2018-12-31 LAB — I-STAT TROPONIN, ED: Troponin i, poc: 0.01 ng/mL (ref 0.00–0.08)

## 2018-12-31 LAB — I-STAT CG4 LACTIC ACID, ED: Lactic Acid, Venous: 0.75 mmol/L (ref 0.5–1.9)

## 2018-12-31 MED ORDER — LORAZEPAM 0.5 MG PO TABS: 0.5000 mg | ORAL_TABLET | Freq: Every evening | ORAL | 0 refills | Status: DC | PRN

## 2018-12-31 MED ORDER — IOHEXOL 300 MG/ML  SOLN
75.0000 mL | Freq: Once | INTRAMUSCULAR | Status: AC | PRN
  Administered 2018-12-31: 100 mL via INTRAVENOUS

## 2018-12-31 MED ORDER — IOPAMIDOL (ISOVUE-370) INJECTION 76%
100.0000 mL | Freq: Once | INTRAVENOUS | Status: AC | PRN
  Administered 2018-12-31: 100 mL via INTRAVENOUS

## 2018-12-31 NOTE — ED Provider Notes (Signed)
[facility] EMERGENCY DEPARTMENT Provider Note   CSN: [id number] Arrival date & time: 12/31/18  0736     History   Chief Complaint Chief Complaint  Patient presents with  . Fever  . Anemia    HPI Paul Mason is a 53 y.o. male.  The history is provided by the patient. No language interpreter was used.  Fever  Anemia    Paul Mason is a 53 y.o. male who presents to the Emergency Department complaining of fever, anemia. He presents to the emergency department complaining of fever and low blood count. He has been sick since mid December with URI symptoms and chest tightness chest pain. His shortness of breath and chest tightness has waxed and waned over the last several weeks. He also reports night sweats for 2 to 3 weeks with intermittent fevers. Today his fever was up to 102.7. He has joint aches as well as pain down his left arm. Over the last week he has noted that his legs are swelling at the end of a workday. He initially lost about 10 pounds but over the last several days he is been steadily increasing in weight. He has been seen by his PCP multiple times over the last several weeks. Most recently he is been diagnosed with anemia with a hemoglobin of 8.6 on blood draw few days ago. He was also diagnosed with pneumonia and treated with a Z pack. He denies any tobacco use or drug use. He does drink occasional alcohol. He has no known medical problems. No recent travel. He is presents today for evaluation because he has profound generalized weakness and fever. He was concerned that his anemia may have worsened. He denies any hematochezia, melena, hemoptysis. Past Medical History:  Diagnosis Date  . Bilateral inguinal hernia 06/08/2017  . GERD (gastroesophageal reflux disease)     Patient Active Problem List   Diagnosis Date Noted  . Bilateral inguinal hernia 06/08/2017    Past Surgical History:  Procedure Laterality Date  . GANGLION CYST EXCISION Left  1984   Wrist  . INGUINAL HERNIA REPAIR Bilateral 06/08/2017   Procedure: OPEN REPAIR BILARTERAL INGUINAL HERNIA WITH MESH;  Surgeon: Fanny Skates, MD;  Location: [facility];  Service: General;  Laterality: Bilateral;  . INSERTION OF MESH Bilateral 06/08/2017   Procedure: INSERTION OF MESH;  Surgeon: Fanny Skates, MD;  Location: [facility];  Service: General;  Laterality: Bilateral;        Home Medications    Prior to Admission medications   Medication Sig Start Date End Date Taking? Authorizing Provider  acetaminophen (TYLENOL) 325 MG tablet Take 975 mg by mouth every 6 (six) hours as needed for mild pain.   Yes [provider]  azithromycin (ZITHROMAX) 250 MG tablet Take 250 mg by mouth See admin instructions. Take 2 tablets by mouth on day 1, then take 1 tablet daily on days 2-5. 12/28/18  Yes [provider]  Esomeprazole Magnesium (NEXIUM PO) Take 1 tablet by mouth daily.   Yes [provider]  ferrous sulfate 325 (65 FE) MG EC tablet Take 325 mg by mouth daily.    Yes [provider]  HYDROcodone-acetaminophen (NORCO) 5-325 MG tablet Take 1-2 tablets by mouth every 4 (four) hours as needed for moderate pain or severe pain. Patient not taking: Reported on 12/31/2018 06/08/17   Rayburn, Claiborne Billings A, PA-C  LORazepam (ATIVAN) 0.5 MG tablet Take 1 tablet (0.5 mg total) by mouth at bedtime as needed for  anxiety or sleep. 12/31/18   Quintella Reichert, MD    Family History No family history on file.  Social History Social History   Tobacco Use  . Smoking status: Never Smoker  . Smokeless tobacco: Never Used  Substance Use Topics  . Alcohol use: Yes    Alcohol/week: 1.0 standard drinks    Types: 1 Cans of beer per week    Comment: Weekly  . Drug use: No     Allergies   Penicillins   Review of Systems Review of Systems  Constitutional: Positive for fever.  All other systems reviewed and are negative.    Physical Exam Updated Vital Signs BP (!)  155/83   Pulse 90   Temp 98.5 F (36.9 C) (Oral)   Resp 20   Ht 5\' 9"  (1.753 m)   Wt 95.3 kg   SpO2 97%   BMI 31.01 kg/m   Physical Exam Vitals signs and nursing note reviewed.  Constitutional:      Appearance: He is well-developed.  HENT:     Head: Normocephalic and atraumatic.  Cardiovascular:     Rate and Rhythm: Normal rate and regular rhythm.     Heart sounds: No murmur.  Pulmonary:     Effort: Pulmonary effort is normal. No respiratory distress.     Breath sounds: Normal breath sounds.  Abdominal:     Palpations: Abdomen is soft.     Tenderness: There is no abdominal tenderness. There is no guarding or rebound.  Musculoskeletal:        General: No tenderness.     Comments: Mild nonpitting edema to BLE, LUE  Lymphadenopathy:     Cervical: Cervical adenopathy present.  Skin:    General: Skin is warm and dry.     Coloration: Skin is pale.  Neurological:     Mental Status: He is alert and oriented to person, place, and time.  Psychiatric:        Mood and Affect: Mood normal.        Behavior: Behavior normal.      ED Treatments / Results  Labs (all labs ordered are listed, but only abnormal results are displayed) Labs Reviewed  COMPREHENSIVE METABOLIC PANEL - Abnormal; Notable for the following components:      Result Value   Glucose, Bld 115 (*)    Calcium 8.4 (*)    Total Protein 6.4 (*)    Albumin 2.6 (*)    All other components within normal limits  CBC WITH DIFFERENTIAL/PLATELET - Abnormal; Notable for the following components:   RBC 3.68 (*)    Hemoglobin 8.6 (*)    HCT 28.9 (*)    MCV 78.5 (*)    MCH 23.4 (*)    MCHC 29.8 (*)    RDW 15.9 (*)    Lymphs Abs 0.6 (*)    All other components within normal limits  URINALYSIS, ROUTINE W REFLEX MICROSCOPIC - Abnormal; Notable for the following components:   Color, Urine STRAW (*)    All other components within normal limits  CULTURE, BLOOD (ROUTINE X 2)  CULTURE, BLOOD (ROUTINE X 2)  I-STAT CG4  LACTIC ACID, ED  I-STAT TROPONIN, ED    EKG EKG Interpretation  Date/Time:  Sunday December 31 2018 07:53:03 EST Ventricular Rate:  89 PR Interval:    QRS Duration: 104 QT Interval:  369 QTC Calculation: 449 R Axis:   25 Text Interpretation:  Sinus rhythm Borderline short PR interval RSR' in V1 or V2, right VCD  or RVH Confirmed by Quintella Reichert [phone]) on 12/31/2018 8:39:42 AM   Radiology Dg Chest 2 View  Result Date: 12/31/2018 CLINICAL DATA:  Night sweats, dyspnea, fever for several weeks EXAM: CHEST - 2 VIEW COMPARISON:  05/14/2007 chest radiograph. FINDINGS: Stable cardiomediastinal silhouette with normal heart size. No pneumothorax. No pleural effusion. No pulmonary edema. No acute consolidative airspace disease. Mild scarring versus atelectasis in the right middle lobe. IMPRESSION: Mild scarring versus atelectasis in the right middle lobe. Otherwise no active disease in the chest. Electronically Signed   By: Ilona Sorrel M.D.   On: 12/31/2018 08:45   Ct Soft Tissue Neck W Contrast  Result Date: 12/31/2018 CLINICAL DATA:  Enlarged lymph nodes chest and axilla EXAM: CT NECK WITH CONTRAST TECHNIQUE: Multidetector CT imaging of the neck was performed using the standard protocol following the bolus administration of intravenous contrast. CONTRAST:  179mL ISOVUE-370 IOPAMIDOL (ISOVUE-370) INJECTION 76%, 117mL OMNIPAQUE IOHEXOL 300 MG/ML SOLN COMPARISON:  None. FINDINGS: Pharynx and larynx: Normal. No mass or swelling. Salivary glands: No inflammation, mass, or stone. Thyroid: Negative Lymph nodes: Enlarged left supraclavicular lymph nodes measuring 17 mm, 29 mm, and 17 mm Left submandibular node 11 mm Left level 2 node 12 mm Right posterior lymph nodes 15 mm and 14 mm Vascular: Normal vascular enhancement Limited intracranial: Negative Visualized orbits: Negative Mastoids and visualized paranasal sinuses: Mild mucosal edema paranasal sinuses. Skeleton: Disc degeneration and moderate spurring  at C3-4 and C5-6. No acute skeletal abnormality. Upper chest: CT chest from today reported separately Other: None IMPRESSION: Enlarged cervical lymph nodes in the neck as above. Most prominent nodes are in the left supraclavicular region. This may be due to metastatic disease or lymphoma. No primary head and neck cancer identified. Electronically Signed   By: Franchot Gallo M.D.   On: 12/31/2018 10:57   Ct Angio Chest Pe W/cm &/or Wo Cm  Result Date: 12/31/2018 CLINICAL DATA:  Joint pain and adenopathy. EXAM: CT ANGIOGRAPHY CHEST CT ABDOMEN AND PELVIS WITH CONTRAST TECHNIQUE: Multidetector CT imaging of the chest was performed using the standard protocol during bolus administration of intravenous contrast. Multiplanar CT image reconstructions and MIPs were obtained to evaluate the vascular anatomy. Multidetector CT imaging of the abdomen and pelvis was performed using the standard protocol during bolus administration of intravenous contrast. CONTRAST:  116mL ISOVUE-370 IOPAMIDOL (ISOVUE-370) INJECTION 76%, 193mL OMNIPAQUE IOHEXOL 300 MG/ML SOLN COMPARISON:  None. FINDINGS: CTA CHEST FINDINGS Cardiovascular: Coronary artery calcifications are identified in the left coronary artery. No cardiomegaly. The thoracic aorta is nonaneurysmal with no dissection or atherosclerosis. No pulmonary emboli. Mediastinum/Nodes: Adenopathy seen in the mediastinum. A representative right paratracheal node on series 3, image 25 measures 16 mm in short axis. Small pleural effusions. No pericardial effusion. Esophagus and thyroid are normal. Lungs/Pleura: Central airways are normal. No pneumothorax. Scar/atelectasis in the right middle lobe. Minimal interlobular septal thickening in the apices suggesting mild edema. A few small bilateral pulmonary nodules are identified. A 5 mm nodule seen in the lateral left upper lobe on series 4, image 48. Mild nodularity along the right major fissure on series 4, image 75 with a representative  nodule measuring 6.7 mm. Small bilateral effusions with associated atelectasis. No masses or other infiltrates. Musculoskeletal: See below. Review of the MIP images confirms the above findings. CT ABDOMEN and PELVIS FINDINGS Hepatobiliary: No focal liver abnormality is seen. No gallstones, gallbladder wall thickening, or biliary dilatation. Pancreas: Unremarkable. No pancreatic ductal dilatation or surrounding inflammatory changes. Spleen: Normal in size  without focal abnormality. Adrenals/Urinary Tract: Adrenal glands are normal. There is abnormal soft tissue associated with the right kidney as seen on series 5, image 47, measuring 4.0 x 3.2 cm, centered in the right renal hilum. This soft tissue encases the proximal right ureter with mild hydronephrosis. There is abnormal soft tissue along the anterior left kidney on series 5, image 46 measuring up to 3.6 cm. There is mild hydronephrosis on the left, likely due to a cuff of soft tissue surrounding the proximal left ureter on series 5, image 50 or measuring 2.5 x 3.1 cm. Or soft tissue surrounds the left ureter on series 5, image 67. Low-attenuation in the posterior kidneys such as on series 5, image 43 is symmetric and likely due to artifact. There are few cysts associated with the left kidney. No ureterectasis or ureteral stones. The bladder is normal. Stomach/Bowel: The stomach and small bowel are normal with no obstruction. The colon is unremarkable. The appendix is normal. Vascular/Lymphatic: The abdominal aorta is nonaneurysmal without atherosclerotic change. There is adenopathy in the retroperitoneum which list the aorta off the spine. This adenopathy measures 5.1 by 3.4 cm. There is adenopathy in the right internal iliac lymph node chain. A representative conglomeration of nodes on series 5, image 84 measures 4.5 x 2.5 cm on image 84. There is an abnormal node posterior to the left side of the bladder on image 80. Mesenteric adenopathy is noted. There is  adenopathy in the porta hepatis which is confluent difficult to measure. Reproductive: Prostate is unremarkable. Other: There is ascites adjacent to the liver and spleen which is mild. There is increased attenuation in the fat and mild fluid centered in the mesentery. This surrounds the pancreas but is probably part of the broader process rather than due to pancreatitis. Musculoskeletal: No acute or significant osseous findings. Review of the MIP images confirms the above findings. IMPRESSION: 1. 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 Abnormal soft tissue associated with both kidneys and surrounding the proximal left ureter is also likely due to lymphoma. This soft tissue associated with the kidneys and left ureter results in mild bilateral hydronephrosis, left greater than right. 2. Small pleural effusions, mild pulmonary edema, ascites, and fluid and stranding in the mesentery is all likely due to third spacing of fluid. The mesenteric findings surrounds the pancreas but is probably part of the broader process of fluid third-spacing. Recommend clinical correlation to exclude signs of pancreatitis. If there is any concern for pancreatitis, recommend obtaining a lipase. 3. Small pulmonary nodules as above. 4. No pulmonary emboli. 5. Coronary artery calcifications. Electronically Signed   By: Dorise Bullion III M.D   On: 12/31/2018 11:33   Ct Abdomen Pelvis W Contrast  Result Date: 12/31/2018 CLINICAL DATA:  Joint pain and adenopathy. EXAM: CT ANGIOGRAPHY CHEST CT ABDOMEN AND PELVIS WITH CONTRAST TECHNIQUE: Multidetector CT imaging of the chest was performed using the standard protocol during bolus administration of intravenous contrast. Multiplanar CT image reconstructions and MIPs were obtained to evaluate the vascular anatomy. Multidetector CT imaging of the abdomen and pelvis was performed using the standard protocol during bolus  administration of intravenous contrast. CONTRAST:  113mL ISOVUE-370 IOPAMIDOL (ISOVUE-370) INJECTION 76%, 159mL OMNIPAQUE IOHEXOL 300 MG/ML SOLN COMPARISON:  None. FINDINGS: CTA CHEST FINDINGS Cardiovascular: Coronary artery calcifications are identified in the left coronary artery. No cardiomegaly. The thoracic aorta is nonaneurysmal with no dissection or atherosclerosis. No pulmonary emboli. Mediastinum/Nodes: Adenopathy seen in  the mediastinum. A representative right paratracheal node on series 3, image 25 measures 16 mm in short axis. Small pleural effusions. No pericardial effusion. Esophagus and thyroid are normal. Lungs/Pleura: Central airways are normal. No pneumothorax. Scar/atelectasis in the right middle lobe. Minimal interlobular septal thickening in the apices suggesting mild edema. A few small bilateral pulmonary nodules are identified. A 5 mm nodule seen in the lateral left upper lobe on series 4, image 48. Mild nodularity along the right major fissure on series 4, image 75 with a representative nodule measuring 6.7 mm. Small bilateral effusions with associated atelectasis. No masses or other infiltrates. Musculoskeletal: See below. Review of the MIP images confirms the above findings. CT ABDOMEN and PELVIS FINDINGS Hepatobiliary: No focal liver abnormality is seen. No gallstones, gallbladder wall thickening, or biliary dilatation. Pancreas: Unremarkable. No pancreatic ductal dilatation or surrounding inflammatory changes. Spleen: Normal in size without focal abnormality. Adrenals/Urinary Tract: Adrenal glands are normal. There is abnormal soft tissue associated with the right kidney as seen on series 5, image 47, measuring 4.0 x 3.2 cm, centered in the right renal hilum. This soft tissue encases the proximal right ureter with mild hydronephrosis. There is abnormal soft tissue along the anterior left kidney on series 5, image 46 measuring up to 3.6 cm. There is mild hydronephrosis on the left, likely  due to a cuff of soft tissue surrounding the proximal left ureter on series 5, image 50 or measuring 2.5 x 3.1 cm. Or soft tissue surrounds the left ureter on series 5, image 67. Low-attenuation in the posterior kidneys such as on series 5, image 43 is symmetric and likely due to artifact. There are few cysts associated with the left kidney. No ureterectasis or ureteral stones. The bladder is normal. Stomach/Bowel: The stomach and small bowel are normal with no obstruction. The colon is unremarkable. The appendix is normal. Vascular/Lymphatic: The abdominal aorta is nonaneurysmal without atherosclerotic change. There is adenopathy in the retroperitoneum which list the aorta off the spine. This adenopathy measures 5.1 by 3.4 cm. There is adenopathy in the right internal iliac lymph node chain. A representative conglomeration of nodes on series 5, image 84 measures 4.5 x 2.5 cm on image 84. There is an abnormal node posterior to the left side of the bladder on image 80. Mesenteric adenopathy is noted. There is adenopathy in the porta hepatis which is confluent difficult to measure. Reproductive: Prostate is unremarkable. Other: There is ascites adjacent to the liver and spleen which is mild. There is increased attenuation in the fat and mild fluid centered in the mesentery. This surrounds the pancreas but is probably part of the broader process rather than due to pancreatitis. Musculoskeletal: No acute or significant osseous findings. Review of the MIP images confirms the above findings. IMPRESSION: 1. 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 Abnormal soft tissue associated with both kidneys and surrounding the proximal left ureter is also likely due to lymphoma. This soft tissue associated with the kidneys and left ureter results in mild bilateral hydronephrosis, left greater than right. 2. Small pleural effusions, mild pulmonary edema, ascites, and  fluid and stranding in the mesentery is all likely due to third spacing of fluid. The mesenteric findings surrounds the pancreas but is probably part of the broader process of fluid third-spacing. Recommend clinical correlation to exclude signs of pancreatitis. If there is any concern for pancreatitis, recommend obtaining a lipase. 3. Small pulmonary nodules as above. 4. No  pulmonary emboli. 5. Coronary artery calcifications. Electronically Signed   By: Dorise Bullion III M.D   On: 12/31/2018 11:33    Procedures Procedures (including critical care time)  Medications Ordered in ED Medications  iopamidol (ISOVUE-370) 76 % injection 100 mL (100 mLs Intravenous Contrast Given 12/31/18 0959)  iohexol (OMNIPAQUE) 300 MG/ML solution 75 mL (100 mLs Intravenous Contrast Given 12/31/18 1000)     Initial Impression / Assessment and Plan / ED Course  I have reviewed the triage vital signs and the nursing notes.  Pertinent labs & imaging results that were available during my care of the patient were reviewed by me and considered in my medical decision making (see chart for details).    Pt here for evaluation of fever, weakness, sob, malaise.  Pt is pale appearing on exam with palpable cervical lymphadenopathy.  Labs demonstrate anemia - stable compared to patient reports from one week ago.  CT obtained, which demonstrates findings concerning for lymphoma.  No evidence of pna or PE.  There are pleural effusions concerning for an element of CHF.  He is not hypoxic or profoundly volume overloaded, no active chest pain.  Discussed case with Dr. Julien Nordmann with Oncology - will refer to Oncology office for expedited Oncology follow up.  Discussed with patient findings of studies and importance of f/u.  Return precautions discussed.    Final Clinical Impressions(s) / ED Diagnoses   Final diagnoses:  Lymphoma, unspecified body region, unspecified lymphoma type [facility])    ED Discharge Orders         Ordered     LORazepam (ATIVAN) 0.5 MG tablet  At bedtime PRN     12/31/18 1325           Quintella Reichert, MD 12/31/18 [phone]

## 2018-12-31 NOTE — ED Triage Notes (Signed)
Pt has been tx multiple times for uri since December.  Recent dx of anemia at 8.6.  C/o joint pain.

## 2019-01-01 ENCOUNTER — Telehealth: Payer: Self-pay | Admitting: Hematology

## 2019-01-01 ENCOUNTER — Other Ambulatory Visit: Payer: Self-pay | Admitting: Hematology

## 2019-01-01 DIAGNOSIS — C8330 Diffuse large B-cell lymphoma, unspecified site: Secondary | ICD-10-CM | POA: Insufficient documentation

## 2019-01-01 DIAGNOSIS — R591 Generalized enlarged lymph nodes: Secondary | ICD-10-CM

## 2019-01-01 DIAGNOSIS — D6481 Anemia due to antineoplastic chemotherapy: Secondary | ICD-10-CM | POA: Insufficient documentation

## 2019-01-01 DIAGNOSIS — T451X5A Adverse effect of antineoplastic and immunosuppressive drugs, initial encounter: Secondary | ICD-10-CM | POA: Insufficient documentation

## 2019-01-01 DIAGNOSIS — D509 Iron deficiency anemia, unspecified: Secondary | ICD-10-CM

## 2019-01-01 NOTE — Telephone Encounter (Signed)
A new patient appt has been scheduled for the pt to see Dr. Maylon Peppers on 1/22 at [facility]. Pt aware to arrive 30 minutes early.

## 2019-01-01 NOTE — Progress Notes (Signed)
[place] NOTE  Patient Care Team: Maury Dus, MD as PCP - General (Family Medicine)  HEME/ONC OVERVIEW: 1. Cervical and abdominal lymphadenopathy -12/2018: CT (for fever and generalized weakness) showed cervical lymphadenopathy (left supraclavicular LN ~3 x 1.5cm), abnormal soft tissue masses surrounding bilateral kidneys (R 4.0 x 3.2cm; L 3 x 2.5cm) encasing the ureters, RP adenopathy (largest 3.4cm), and R internal iliac lymphadenopathy (conglomerate 4.5 x 2.5cm)  ASSESSMENT & PLAN:   Cervical and abdominal lymphadenopathy -I reviewed the patient's records in detail, including recent ER notes and lab studies -I also independently reviewed the radiologic images of recent CT neck and CAP, and agree with the findings documented -In summary, patient presented to ER in mid 12/2018 for fever and generalized weakness; CT neck and CAP showed lymphadenopathy in the cervical lymph nodes (left supraclavicular LN ~3 x 1.5 cm), bilateral soft tissue masses surrounding the kidneys, retroperitoneal and the right internal iliac lymphadenopathy -I reviewed the imaging results with the patient in detail -Given the generalized lymphadenopathy, this is concerning for lymphoproliferative disease -I have ordered PET scan for further assessment of other lymph node involvement -I also ordered baseline labs, including viral hepatitis panel, in the event the lymphoma diagnosis is confirmed  -Finally, I have also referred the patient to general surgery (Dr. Dalbert Batman) for excisional lymph node biopsy for tissue confirmation -Once the diagnosis is confirmed, we will be able to discuss any additional work-up as needed (bone marrow bx, TTE), treatment options, expected toxicities, and prognosis  Microcytic anemia -Hgb 8.6 in mid-12/2018 in the ER; no prior recent labs for comparison except CBC in 2018 (Hgb normal at that time) -Iron profile today consistent with anemia of chronic disease   -Colonoscopy in 2018 reportedly showed internal hemorrhoids but otherwise normal; no prior EGD  -Patient denies any symptoms of bleeding  -He is currently on daily iron -We will prioritize work-up for possible lymphoproliferative disease above, and if the work-up is unremarkable, then we can reassess any further evaluation for GI bleeding as needed   Orders Placed This Encounter  Procedures  . NM PET Image Initial (PI) Skull Base To Thigh    Standing Status:   Future    Standing Expiration Date:   01/03/2020    Order Specific Question:   If indicated for the ordered procedure, I authorize the administration of a radiopharmaceutical per Radiology protocol    Answer:   Yes    Order Specific Question:   Preferred imaging location?    Answer:   [facility]    Order Specific Question:   Radiology Contrast Protocol - do NOT remove file path    Answer:   [email or web address]  . Ambulatory referral to General Surgery    Referral Priority:   Urgent    Referral Type:   Surgical    Referral Reason:   Specialty Services Required    Referred to Provider:   Fanny Skates, MD    Requested Specialty:   General Surgery    Number of Visits Requested:   1   All questions were answered. The patient knows to call the clinic with any problems, questions or concerns.  Return in 2 weeks for clinic follow-up on work-up above.   Tish Men, MD 01/03/2019 10:03 AM   CHIEF COMPLAINTS/PURPOSE OF CONSULTATION:  "I am a little tired"  HISTORY OF PRESENTING ILLNESS:  Paul Mason 53 y.o. male is here because of cervical and abdominal lymphadenopathy.  Patient reports that he first  developed URI symptoms (fever up to 101, generalized achiness, and cough) in 11/2018, for which he was seen by his PCP, and was thought to due to viral infection.  In 12/2018, he developed intermittent chest tightness, not associated with activity, for which he was again evaluated by his PCP.  EKG  was done, which was normal, and this was thought to be due to costochondritis, for which she was prescribed NSAIDs.  However, he continued to have progressive generalized weakness, arthralgia, and the labs in mid 12/2018 showed new onset anemia with hemoglobin in the mid 8 range.  He was also diagnosed with pneumonia and treated with antibiotics, as well as started on iron supplement.  He denies any symptoms of bleeding, such as hematemesis, hemoptysis, hematochezia, melena, hematuria.  Due to intermittent fever up to 102, night sweats, and generalized achiness/weakness, he presented to ER in mid-12/2018 for further evaluation. Due to the anemia, fever, and chest tightness, CT neck, CTA chest, and CT abdomen/pelvis were done, which showed cervical and intra-abdominal lymphadenopathy.  Patient was referred to oncology for further evaluation.  I have reviewed his chart and materials related to his cancer extensively and collaborated history with the patient. Summary of oncologic history is as follows:  No history exists.    MEDICAL HISTORY:  Past Medical History:  Diagnosis Date  . Bilateral inguinal hernia 06/08/2017  . GERD (gastroesophageal reflux disease)     SURGICAL HISTORY: Past Surgical History:  Procedure Laterality Date  . GANGLION CYST EXCISION Left 1984   Wrist  . INGUINAL HERNIA REPAIR Bilateral 06/08/2017   Procedure: OPEN REPAIR BILARTERAL INGUINAL HERNIA WITH MESH;  Surgeon: Fanny Skates, MD;  Location: [facility];  Service: General;  Laterality: Bilateral;  . INSERTION OF MESH Bilateral 06/08/2017   Procedure: INSERTION OF MESH;  Surgeon: Fanny Skates, MD;  Location: [facility];  Service: General;  Laterality: Bilateral;    SOCIAL HISTORY: Social History   Socioeconomic History  . Marital status: Divorced    Spouse name: Not on file  . Number of children: Not on file  . Years of education: Not on file  . Highest education level: Not on file  Occupational History  . Not  on file  Social Needs  . Financial resource strain: Not on file  . Food insecurity:    Worry: Not on file    Inability: Not on file  . Transportation needs:    Medical: Not on file    Non-medical: Not on file  Tobacco Use  . Smoking status: Never Smoker  . Smokeless tobacco: Never Used  Substance and Sexual Activity  . Alcohol use: Yes    Alcohol/week: 1.0 standard drinks    Types: 1 Cans of beer per week    Comment: Weekly  . Drug use: No  . Sexual activity: Not on file  Lifestyle  . Physical activity:    Days per week: Not on file    Minutes per session: Not on file  . Stress: Not on file  Relationships  . Social connections:    Talks on phone: Not on file    Gets together: Not on file    Attends religious service: Not on file    Active member of club or organization: Not on file    Attends meetings of clubs or organizations: Not on file    Relationship status: Not on file  . Intimate partner violence:    Fear of current or ex partner: Not on file  Emotionally abused: Not on file    Physically abused: Not on file    Forced sexual activity: Not on file  Other Topics Concern  . Not on file  Social History Narrative  . Not on file    FAMILY HISTORY: History reviewed. No pertinent family history.  ALLERGIES:  is allergic to penicillins.  MEDICATIONS:  Current Outpatient Medications  Medication Sig Dispense Refill  . acetaminophen (TYLENOL) 325 MG tablet Take 975 mg by mouth every 6 (six) hours as needed for mild pain.    . ferrous sulfate 325 (65 FE) MG EC tablet Take 325 mg by mouth daily.     Marland Kitchen LORazepam (ATIVAN) 0.5 MG tablet Take 1 tablet (0.5 mg total) by mouth at bedtime as needed for anxiety or sleep. 5 tablet 0  . HYDROcodone-acetaminophen (NORCO) 5-325 MG tablet Take 1-2 tablets by mouth every 4 (four) hours as needed for moderate pain or severe pain. (Patient not taking: Reported on 12/31/2018) 30 tablet 0   No current facility-administered medications  for this visit.     REVIEW OF SYSTEMS:   Constitutional: ( + ) fevers, ( - )  chills , ( + ) night sweats Eyes: ( - ) blurriness of vision, ( - ) double vision, ( - ) watery eyes Ears, nose, mouth, throat, and face: ( - ) mucositis, ( - ) sore throat Respiratory: ( - ) cough, ( - ) dyspnea, ( - ) wheezes Cardiovascular: ( - ) palpitation, ( - ) chest discomfort, ( - ) lower extremity swelling Gastrointestinal:  ( - ) nausea, ( - ) heartburn, ( - ) change in bowel habits Skin: ( - ) abnormal skin rashes Lymphatics: ( - ) new lymphadenopathy, ( - ) easy bruising Neurological: ( - ) numbness, ( - ) tingling, ( + ) generalized weaknesses Behavioral/Psych: ( - ) mood change, ( - ) new changes  All other systems were reviewed with the patient and are negative.  PHYSICAL EXAMINATION: ECOG PERFORMANCE STATUS: 1 - Symptomatic but completely ambulatory  Vitals:   01/03/19 0902  BP: 139/82  Pulse: 91  Resp: 18  Temp: 98.4 F (36.9 C)  SpO2: 100%   Filed Weights   01/03/19 0902  Weight: 209 lb 3.2 oz (94.9 kg)    GENERAL: alert, no distress and comfortable SKIN: skin color, texture, turgor are normal, no rashes or significant lesions EYES: conjunctiva are pink and non-injected, sclera clear OROPHARYNX: no exudate, no erythema; lips, buccal mucosa, and tongue normal  NECK: supple, non-tender LYMPH:  Left supraclavicular fullness, no obvious cervical or axillary adenopathy LUNGS: clear to auscultation with normal breathing effort HEART: regular rate & rhythm, no murmurs, no lower extremity edema ABDOMEN: soft, non-tender, non-distended, normal bowel sounds Musculoskeletal: no cyanosis of digits and no clubbing  PSYCH: alert & oriented x 3, fluent speech NEURO: no focal motor/sensory deficits  LABORATORY DATA:  I have reviewed the data as listed Lab Results  Component Value Date   WBC 5.9 01/03/2019   HGB 8.3 (L) 01/03/2019   HCT 27.7 (L) 01/03/2019   MCV 76.7 (L) 01/03/2019    PLT 276 01/03/2019   Lab Results  Component Value Date   NA 142 01/03/2019   K 3.9 01/03/2019   CL 103 01/03/2019   CO2 28 01/03/2019    RADIOGRAPHIC STUDIES: I have personally reviewed the radiological images as listed and agreed with the findings in the report. Dg Chest 2 View  Result Date: 12/31/2018 CLINICAL DATA:  Night sweats, dyspnea, fever for several weeks EXAM: CHEST - 2 VIEW COMPARISON:  05/14/2007 chest radiograph. FINDINGS: Stable cardiomediastinal silhouette with normal heart size. No pneumothorax. No pleural effusion. No pulmonary edema. No acute consolidative airspace disease. Mild scarring versus atelectasis in the right middle lobe. IMPRESSION: Mild scarring versus atelectasis in the right middle lobe. Otherwise no active disease in the chest. Electronically Signed   By: Ilona Sorrel M.D.   On: 12/31/2018 08:45   Ct Soft Tissue Neck W Contrast  Result Date: 12/31/2018 CLINICAL DATA:  Enlarged lymph nodes chest and axilla EXAM: CT NECK WITH CONTRAST TECHNIQUE: Multidetector CT imaging of the neck was performed using the standard protocol following the bolus administration of intravenous contrast. CONTRAST:  113mL ISOVUE-370 IOPAMIDOL (ISOVUE-370) INJECTION 76%, 172mL OMNIPAQUE IOHEXOL 300 MG/ML SOLN COMPARISON:  None. FINDINGS: Pharynx and larynx: Normal. No mass or swelling. Salivary glands: No inflammation, mass, or stone. Thyroid: Negative Lymph nodes: Enlarged left supraclavicular lymph nodes measuring 17 mm, 29 mm, and 17 mm Left submandibular node 11 mm Left level 2 node 12 mm Right posterior lymph nodes 15 mm and 14 mm Vascular: Normal vascular enhancement Limited intracranial: Negative Visualized orbits: Negative Mastoids and visualized paranasal sinuses: Mild mucosal edema paranasal sinuses. Skeleton: Disc degeneration and moderate spurring at C3-4 and C5-6. No acute skeletal abnormality. Upper chest: CT chest from today reported separately Other: None IMPRESSION: Enlarged  cervical lymph nodes in the neck as above. Most prominent nodes are in the left supraclavicular region. This may be due to metastatic disease or lymphoma. No primary head and neck cancer identified. Electronically Signed   By: Franchot Gallo M.D.   On: 12/31/2018 10:57   Ct Angio Chest Pe W/cm &/or Wo Cm  Result Date: 12/31/2018 CLINICAL DATA:  Joint pain and adenopathy. EXAM: CT ANGIOGRAPHY CHEST CT ABDOMEN AND PELVIS WITH CONTRAST TECHNIQUE: Multidetector CT imaging of the chest was performed using the standard protocol during bolus administration of intravenous contrast. Multiplanar CT image reconstructions and MIPs were obtained to evaluate the vascular anatomy. Multidetector CT imaging of the abdomen and pelvis was performed using the standard protocol during bolus administration of intravenous contrast. CONTRAST:  139mL ISOVUE-370 IOPAMIDOL (ISOVUE-370) INJECTION 76%, 166mL OMNIPAQUE IOHEXOL 300 MG/ML SOLN COMPARISON:  None. FINDINGS: CTA CHEST FINDINGS Cardiovascular: Coronary artery calcifications are identified in the left coronary artery. No cardiomegaly. The thoracic aorta is nonaneurysmal with no dissection or atherosclerosis. No pulmonary emboli. Mediastinum/Nodes: Adenopathy seen in the mediastinum. A representative right paratracheal node on series 3, image 25 measures 16 mm in short axis. Small pleural effusions. No pericardial effusion. Esophagus and thyroid are normal. Lungs/Pleura: Central airways are normal. No pneumothorax. Scar/atelectasis in the right middle lobe. Minimal interlobular septal thickening in the apices suggesting mild edema. A few small bilateral pulmonary nodules are identified. A 5 mm nodule seen in the lateral left upper lobe on series 4, image 48. Mild nodularity along the right major fissure on series 4, image 75 with a representative nodule measuring 6.7 mm. Small bilateral effusions with associated atelectasis. No masses or other infiltrates. Musculoskeletal: See  below. Review of the MIP images confirms the above findings. CT ABDOMEN and PELVIS FINDINGS Hepatobiliary: No focal liver abnormality is seen. No gallstones, gallbladder wall thickening, or biliary dilatation. Pancreas: Unremarkable. No pancreatic ductal dilatation or surrounding inflammatory changes. Spleen: Normal in size without focal abnormality. Adrenals/Urinary Tract: Adrenal glands are normal. There is abnormal soft tissue associated with the right kidney as seen on series 5, image  47, measuring 4.0 x 3.2 cm, centered in the right renal hilum. This soft tissue encases the proximal right ureter with mild hydronephrosis. There is abnormal soft tissue along the anterior left kidney on series 5, image 46 measuring up to 3.6 cm. There is mild hydronephrosis on the left, likely due to a cuff of soft tissue surrounding the proximal left ureter on series 5, image 50 or measuring 2.5 x 3.1 cm. Or soft tissue surrounds the left ureter on series 5, image 67. Low-attenuation in the posterior kidneys such as on series 5, image 43 is symmetric and likely due to artifact. There are few cysts associated with the left kidney. No ureterectasis or ureteral stones. The bladder is normal. Stomach/Bowel: The stomach and small bowel are normal with no obstruction. The colon is unremarkable. The appendix is normal. Vascular/Lymphatic: The abdominal aorta is nonaneurysmal without atherosclerotic change. There is adenopathy in the retroperitoneum which list the aorta off the spine. This adenopathy measures 5.1 by 3.4 cm. There is adenopathy in the right internal iliac lymph node chain. A representative conglomeration of nodes on series 5, image 84 measures 4.5 x 2.5 cm on image 84. There is an abnormal node posterior to the left side of the bladder on image 80. Mesenteric adenopathy is noted. There is adenopathy in the porta hepatis which is confluent difficult to measure. Reproductive: Prostate is unremarkable. Other: There is  ascites adjacent to the liver and spleen which is mild. There is increased attenuation in the fat and mild fluid centered in the mesentery. This surrounds the pancreas but is probably part of the broader process rather than due to pancreatitis. Musculoskeletal: No acute or significant osseous findings. Review of the MIP images confirms the above findings. IMPRESSION: 1. 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 Abnormal soft tissue associated with both kidneys and surrounding the proximal left ureter is also likely due to lymphoma. This soft tissue associated with the kidneys and left ureter results in mild bilateral hydronephrosis, left greater than right. 2. Small pleural effusions, mild pulmonary edema, ascites, and fluid and stranding in the mesentery is all likely due to third spacing of fluid. The mesenteric findings surrounds the pancreas but is probably part of the broader process of fluid third-spacing. Recommend clinical correlation to exclude signs of pancreatitis. If there is any concern for pancreatitis, recommend obtaining a lipase. 3. Small pulmonary nodules as above. 4. No pulmonary emboli. 5. Coronary artery calcifications. Electronically Signed   By: Dorise Bullion III M.D   On: 12/31/2018 11:33   Ct Abdomen Pelvis W Contrast  Result Date: 12/31/2018 CLINICAL DATA:  Joint pain and adenopathy. EXAM: CT ANGIOGRAPHY CHEST CT ABDOMEN AND PELVIS WITH CONTRAST TECHNIQUE: Multidetector CT imaging of the chest was performed using the standard protocol during bolus administration of intravenous contrast. Multiplanar CT image reconstructions and MIPs were obtained to evaluate the vascular anatomy. Multidetector CT imaging of the abdomen and pelvis was performed using the standard protocol during bolus administration of intravenous contrast. CONTRAST:  150mL ISOVUE-370 IOPAMIDOL (ISOVUE-370) INJECTION 76%, 17mL OMNIPAQUE IOHEXOL 300  MG/ML SOLN COMPARISON:  None. FINDINGS: CTA CHEST FINDINGS Cardiovascular: Coronary artery calcifications are identified in the left coronary artery. No cardiomegaly. The thoracic aorta is nonaneurysmal with no dissection or atherosclerosis. No pulmonary emboli. Mediastinum/Nodes: Adenopathy seen in the mediastinum. A representative right paratracheal node on series 3, image 25 measures 16 mm in short axis. Small pleural effusions. No pericardial effusion. Esophagus  and thyroid are normal. Lungs/Pleura: Central airways are normal. No pneumothorax. Scar/atelectasis in the right middle lobe. Minimal interlobular septal thickening in the apices suggesting mild edema. A few small bilateral pulmonary nodules are identified. A 5 mm nodule seen in the lateral left upper lobe on series 4, image 48. Mild nodularity along the right major fissure on series 4, image 75 with a representative nodule measuring 6.7 mm. Small bilateral effusions with associated atelectasis. No masses or other infiltrates. Musculoskeletal: See below. Review of the MIP images confirms the above findings. CT ABDOMEN and PELVIS FINDINGS Hepatobiliary: No focal liver abnormality is seen. No gallstones, gallbladder wall thickening, or biliary dilatation. Pancreas: Unremarkable. No pancreatic ductal dilatation or surrounding inflammatory changes. Spleen: Normal in size without focal abnormality. Adrenals/Urinary Tract: Adrenal glands are normal. There is abnormal soft tissue associated with the right kidney as seen on series 5, image 47, measuring 4.0 x 3.2 cm, centered in the right renal hilum. This soft tissue encases the proximal right ureter with mild hydronephrosis. There is abnormal soft tissue along the anterior left kidney on series 5, image 46 measuring up to 3.6 cm. There is mild hydronephrosis on the left, likely due to a cuff of soft tissue surrounding the proximal left ureter on series 5, image 50 or measuring 2.5 x 3.1 cm. Or soft tissue  surrounds the left ureter on series 5, image 67. Low-attenuation in the posterior kidneys such as on series 5, image 43 is symmetric and likely due to artifact. There are few cysts associated with the left kidney. No ureterectasis or ureteral stones. The bladder is normal. Stomach/Bowel: The stomach and small bowel are normal with no obstruction. The colon is unremarkable. The appendix is normal. Vascular/Lymphatic: The abdominal aorta is nonaneurysmal without atherosclerotic change. There is adenopathy in the retroperitoneum which list the aorta off the spine. This adenopathy measures 5.1 by 3.4 cm. There is adenopathy in the right internal iliac lymph node chain. A representative conglomeration of nodes on series 5, image 84 measures 4.5 x 2.5 cm on image 84. There is an abnormal node posterior to the left side of the bladder on image 80. Mesenteric adenopathy is noted. There is adenopathy in the porta hepatis which is confluent difficult to measure. Reproductive: Prostate is unremarkable. Other: There is ascites adjacent to the liver and spleen which is mild. There is increased attenuation in the fat and mild fluid centered in the mesentery. This surrounds the pancreas but is probably part of the broader process rather than due to pancreatitis. Musculoskeletal: No acute or significant osseous findings. Review of the MIP images confirms the above findings. IMPRESSION: 1. 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 Abnormal soft tissue associated with both kidneys and surrounding the proximal left ureter is also likely due to lymphoma. This soft tissue associated with the kidneys and left ureter results in mild bilateral hydronephrosis, left greater than right. 2. Small pleural effusions, mild pulmonary edema, ascites, and fluid and stranding in the mesentery is all likely due to third spacing of fluid. The mesenteric findings surrounds the pancreas  but is probably part of the broader process of fluid third-spacing. Recommend clinical correlation to exclude signs of pancreatitis. If there is any concern for pancreatitis, recommend obtaining a lipase. 3. Small pulmonary nodules as above. 4. No pulmonary emboli. 5. Coronary artery calcifications. Electronically Signed   By: Dorise Bullion III M.D   On: 12/31/2018 11:33

## 2019-01-03 ENCOUNTER — Inpatient Hospital Stay: Payer: BC Managed Care – PPO | Attending: Hematology | Admitting: Hematology

## 2019-01-03 ENCOUNTER — Telehealth: Payer: Self-pay | Admitting: Hematology

## 2019-01-03 ENCOUNTER — Inpatient Hospital Stay: Payer: BC Managed Care – PPO

## 2019-01-03 ENCOUNTER — Encounter: Payer: Self-pay | Admitting: Hematology

## 2019-01-03 VITALS — BP 139/82 | HR 91 | Temp 98.4°F | Resp 18 | Ht 69.0 in | Wt 209.2 lb

## 2019-01-03 DIAGNOSIS — D509 Iron deficiency anemia, unspecified: Secondary | ICD-10-CM | POA: Insufficient documentation

## 2019-01-03 DIAGNOSIS — R599 Enlarged lymph nodes, unspecified: Secondary | ICD-10-CM | POA: Diagnosis not present

## 2019-01-03 DIAGNOSIS — R591 Generalized enlarged lymph nodes: Secondary | ICD-10-CM | POA: Diagnosis present

## 2019-01-03 LAB — CMP (CANCER CENTER ONLY)
ALT: 21 U/L (ref 0–44)
AST: 18 U/L (ref 15–41)
Albumin: 2.8 g/dL — ABNORMAL LOW (ref 3.5–5.0)
Alkaline Phosphatase: 106 U/L (ref 38–126)
Anion gap: 11 (ref 5–15)
BUN: 10 mg/dL (ref 6–20)
CO2: 28 mmol/L (ref 22–32)
Calcium: 8.9 mg/dL (ref 8.9–10.3)
Chloride: 103 mmol/L (ref 98–111)
Creatinine: 0.84 mg/dL (ref 0.61–1.24)
GFR, Est AFR Am: >60 mL/min (ref >60)
GFR, Estimated: >60 mL/min (ref >60)
Glucose, Bld: 111 mg/dL — ABNORMAL HIGH (ref 70–99)
Potassium: 3.9 mmol/L (ref 3.5–5.1)
Sodium: 142 mmol/L (ref 135–145)
Total Bilirubin: 0.3 mg/dL (ref 0.3–1.2)
Total Protein: 6.9 g/dL (ref 6.5–8.1)

## 2019-01-03 LAB — CBC WITH DIFFERENTIAL (CANCER CENTER ONLY)
Abs Immature Granulocytes: 0.05 10*3/uL (ref 0.00–0.07)
Basophils Absolute: 0 10*3/uL (ref 0.0–0.1)
Basophils Relative: 0 %
Eosinophils Absolute: 0.1 10*3/uL (ref 0.0–0.5)
Eosinophils Relative: 2 %
HCT: 27.7 % — ABNORMAL LOW (ref 39.0–52.0)
Hemoglobin: 8.3 g/dL — ABNORMAL LOW (ref 13.0–17.0)
Immature Granulocytes: 1 %
Lymphocytes Relative: 8 %
Lymphs Abs: 0.5 10*3/uL — ABNORMAL LOW (ref 0.7–4.0)
MCH: 23 pg — ABNORMAL LOW (ref 26.0–34.0)
MCHC: 30 g/dL (ref 30.0–36.0)
MCV: 76.7 fL — ABNORMAL LOW (ref 80.0–100.0)
Monocytes Absolute: 0.4 10*3/uL (ref 0.1–1.0)
Monocytes Relative: 6 %
Neutro Abs: 4.9 10*3/uL (ref 1.7–7.7)
Neutrophils Relative %: 83 %
Platelet Count: 276 10*3/uL (ref 150–400)
RBC: 3.61 MIL/uL — ABNORMAL LOW (ref 4.22–5.81)
RDW: 16.1 % — ABNORMAL HIGH (ref 11.5–15.5)
WBC Count: 5.9 10*3/uL (ref 4.0–10.5)
nRBC: 0 % (ref 0.0–0.2)

## 2019-01-03 LAB — URIC ACID: Uric Acid, Serum: 5.6 mg/dL (ref 3.7–8.6)

## 2019-01-03 LAB — IRON AND TIBC
Iron: 16 ug/dL — ABNORMAL LOW (ref 42–163)
Saturation Ratios: 9 % — ABNORMAL LOW (ref 20–55)
TIBC: 178 ug/dL — ABNORMAL LOW (ref 202–409)
UIBC: 161 ug/dL (ref 117–376)

## 2019-01-03 LAB — SAVE SMEAR(SSMR), FOR PROVIDER SLIDE REVIEW

## 2019-01-03 LAB — FERRITIN: Ferritin: 511 ng/mL — ABNORMAL HIGH (ref 24–336)

## 2019-01-03 LAB — LACTATE DEHYDROGENASE: LDH: 732 U/L — ABNORMAL HIGH (ref 98–192)

## 2019-01-03 NOTE — Telephone Encounter (Signed)
Scheduled appt per 01/22 los. ° °Printed calendar and avs. °

## 2019-01-04 ENCOUNTER — Telehealth: Payer: Self-pay

## 2019-01-04 ENCOUNTER — Other Ambulatory Visit: Payer: Self-pay | Admitting: General Surgery

## 2019-01-04 LAB — HEPATITIS C ANTIBODY (REFLEX): HCV Ab: <0.1 s/co ratio (ref 0.0–0.9)

## 2019-01-04 LAB — HEPATITIS B SURFACE ANTIBODY,QUALITATIVE: HEP B S AB: REACTIVE

## 2019-01-04 LAB — HCV COMMENT:

## 2019-01-04 LAB — HEPATITIS B SURFACE ANTIGEN: Hepatitis B Surface Ag: NEGATIVE

## 2019-01-04 LAB — HEPATITIS B CORE ANTIBODY, TOTAL: Hep B Core Total Ab: NEGATIVE

## 2019-01-04 LAB — HIV ANTIBODY (ROUTINE TESTING W REFLEX): HIV Screen 4th Generation wRfx: NONREACTIVE

## 2019-01-04 NOTE — Telephone Encounter (Signed)
Voicemail received from patient requesting a return phone call. Called patient back and no answer. Left a message for patient stating return phone call was attempted and to call the office back at [phone].

## 2019-01-05 LAB — CULTURE, BLOOD (ROUTINE X 2)
CULTURE: NO GROWTH
Culture: NO GROWTH

## 2019-01-06 NOTE — H&P (Signed)
Paul Mason Location: [facility] Surgery Patient #: [id number] DOB: [birth year] Single / Language: Paul Mason / Race: White Male        History of Present Illness      . This is a very pleasant 53 year old man. Former patient of mine. His mother is with him throughout the encounter today. His mother is a Marine scientist. He is referred by Dr. Maylon Peppers at the [facility] for consideration of left cervical lymph node biopsy. Lymphoma is suspected.     I repaired bilateral inguinal hernias 2 years ago and he's done fine with that. Over the past few weeks he is developed some sweats and progressive fatigue and a little bit of fever. He reported some chest tightness which did not improve with anti-inflammatories. His weight has been stable. He was seen in the ED. CT scan of neck chest abdomen and pelvis and CT angiogram have been done. Left supraclavicular lymph nodes are present and are most prominent, approaching 3 cm. Abnormal soft tissue masses surrounding both kidneys. Retroperitoneal adenopathy. Right internal iliac adenopathy. Mild bilateral hydronephrosis. Small amount of pleural fluid and ascites. Small pulmonary nodules. No pulmonary emboli. PET scan is scheduled for February 3 Lab work reveals platelet count 276,000. Hemoglobin 8.3. White count 5900. Liver function tests, creatinine normal. LDH significantly elevated. Iron level significantly depressed. HIV negative. Hepatitis B antibody positive suggesting prior infection     Past history significant for bilateral inguinal hernia repairs. Ganglion cyst. Basically healthy. Family history reveals an uncle had lymphoma 40 years ago and is still alive Social history reveals he is hoarse. Lives in [place]. 2 sons with joint custody. Denies tobacco. Seldom drinks alcohol. 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      Exam revealed prominent but soft adenopathy in the lateral left supraclavicular  area. No significant peripheral adenopathy elsewhere. No organomegaly He wants to go ahead as soon as possible with lymph node biopsy to establish his diagnosis He will be scheduled for left supraclavicular lymph node biopsy under general anesthesia in the near future I told him this could be done before or after his PET scan and he will check with Dr. Maylon Peppers about that.   Allergies PenicillAMINE *MISCELLANEOUS THERAPEUTIC CLASSES*  Hives. Allergies Reconciled   Medication History  Azithromycin (250MG  Tablet, Oral) Active. No Current Medications (Taken starting 01/04/2019) Meloxicam (15MG  Tablet, Oral) Active. Naproxen (500MG  Tablet, Oral) Active. Medications Reconciled  Vitals  Weight: 211.38 lb Height: 69in Body Surface Area: 2.11 m Body Mass Index: 31.21 kg/m  Temp.: 97.73F(Temporal)  Pulse: 98 (Regular)  P.OX: 89% (Room air) BP: 154/96 (Sitting, Right Arm, Standard)       Physical Exam  General Mental Status-Alert. General Appearance-Consistent with stated age. Hydration-Well hydrated. Voice-Normal. Note: Very pleasant. Mother is with him. Pallor is obvious.   Head and Neck Head -Note: 3 cm nontender fullness and left supraclavicular area. This is fairly lateral. No other large lymph nodes in these right supraclavicular, cervical, or suboccipital area.  Trachea-midline. Thyroid Gland Characteristics - normal size and consistency.  Eye Eyeball - Bilateral-Extraocular movements intact. Sclera/Conjunctiva - Bilateral-No scleral icterus.  Chest and Lung Exam Chest and lung exam reveals -quiet, even and easy respiratory effort with no use of accessory muscles and on auscultation, normal breath sounds, no adventitious sounds and normal vocal resonance. Inspection Chest Wall - Normal. Back - normal.  Cardiovascular Cardiovascular examination reveals -normal heart sounds, regular rate and rhythm with no murmurs and normal  pedal pulses bilaterally.  Abdomen Inspection Inspection of the abdomen reveals - No Hernias. Skin - Scar - no surgical scars. Palpation/Percussion Palpation and Percussion of the abdomen reveal - Soft, Non Tender, No Rebound tenderness, No Rigidity (guarding) and No hepatosplenomegaly. Auscultation Auscultation of the abdomen reveals - Bowel sounds normal. Note: Subjectively palpation of the abdomen gives him the sensation of fullness but don't feel any mass or hernia. No organomegaly   Neurologic Neurologic evaluation reveals -alert and oriented x 3 with no impairment of recent or remote memory. Mental Status-Normal.  Musculoskeletal Normal Exam - Left-Upper Extremity Strength Normal and Lower Extremity Strength Normal. Normal Exam - Right-Upper Extremity Strength Normal and Lower Extremity Strength Normal.  Lymphatic Note: No significant adenopathy in either axilla or in either inguinal area     Assessment & Plan  LYMPHADENOPATHY, CERVICAL (R59.0)   You have developed significant anemia, and that is probably why your energy level is so low your CT scan showed enlarged lymph nodes in the neck and the abdomen Lymphoma is suspected  On examination today the most accessible lymph node appears to be in the left neck laterally you will be scheduled for excision left neck mass under general anesthesia as an outpatient in the near future I discussed the indications, techniques, and risk of the surgery in detail with you and your mother  If this turns out to be a lymphoma, and if you need a Port-A-Cath, I will be happy to help with that as well  ANEMIA, DEFICIENCY (D53.9)    Paul Mason. Paul Mason, M.D., [facility] General and Minimally invasive Surgery Breast and Colorectal Surgery Office:   [phone] Pager:   [phone]

## 2019-01-08 NOTE — Pre-Procedure Instructions (Addendum)
Paul Mason  01/08/2019      [id number] [place] [address] [place] [address] Phone: [phone] Fax: [phone]    Your procedure is scheduled on Friday, January 31st.  Report to [facility] Admitting at 10:00 A.M.  Call this number if you have problems the morning of surgery:  [phone]   Remember:  Do not eat after midnight.  You may drink clear liquids until 9:00 A.M. (3 Hours prior to procedure time) .  Clear liquids allowed are: Water, Juice (non-citric and without pulp), Carbonated beverages, Clear Tea, Black Coffee only and Gatorade. No MILK PRODUCTS.    Take these medicines the morning of surgery with A SIP OF WATER  acetaminophen (TYLENOL)-if needed for pain   As of today, surgery STOP taking any Aspirin (unless otherwise instructed by your surgeon), Aleve, Naproxen, Ibuprofen, Motrin, Advil, Goody's, BC's, all herbal medications, fish oil, and all vitamins.   Do not wear jewelry.  Do not wear lotions, powders, or colognes, or deodorant.  Do not shave 48 hours prior to surgery.  Men may shave face and neck.  Do not bring valuables to the hospital.  [facility] is not responsible for any belongings or valuables.  Contacts, dentures or bridgework may not be worn into surgery.  Leave your suitcase in the car.  After surgery it may be brought to your room.  For patients admitted to the hospital, discharge time will be determined by your treatment team.  Patients discharged the day of surgery will not be allowed to drive home.   Special instructions:   [place]- Preparing For Surgery  Before surgery, you can play an important role. Because skin is not sterile, your skin needs to be as free of germs as possible. You can reduce the number of germs on your skin by washing with CHG (chlorahexidine gluconate) Soap before surgery.  CHG is an antiseptic cleaner which kills germs and bonds with the skin to  continue killing germs even after washing.    Oral Hygiene is also important to reduce your risk of infection.  Remember - BRUSH YOUR TEETH THE MORNING OF SURGERY WITH YOUR REGULAR TOOTHPASTE  Please do not use if you have an allergy to CHG or antibacterial soaps. If your skin becomes reddened/irritated stop using the CHG.  Do not shave (including legs and underarms) for at least 48 hours prior to first CHG shower. It is OK to shave your face.  Please follow these instructions carefully.   1. Shower the NIGHT BEFORE SURGERY and the MORNING OF SURGERY with CHG.   2. If you chose to wash your hair, wash your hair first as usual with your normal shampoo.  3. After you shampoo, rinse your hair and body thoroughly to remove the shampoo.  4. Use CHG as you would any other liquid soap. You can apply CHG directly to the skin and wash gently with a scrungie or a clean washcloth.   5. Apply the CHG Soap to your body ONLY FROM THE NECK DOWN.  Do not use on open wounds or open sores. Avoid contact with your eyes, ears, mouth and genitals (private parts). Wash Face and genitals (private parts)  with your normal soap.  6. Wash thoroughly, paying special attention to the area where your surgery will be performed.  7. Thoroughly rinse your body with warm water from the neck down.  8. DO NOT shower/wash with your normal soap  after using and rinsing off the CHG Soap.  9. Pat yourself dry with a CLEAN TOWEL.  10. Wear CLEAN PAJAMAS to bed the night before surgery, wear comfortable clothes the morning of surgery  11. Place CLEAN SHEETS on your bed the night of your first shower and DO NOT SLEEP WITH PETS.    Day of Surgery:  Do not apply any deodorants/lotions.  Please wear clean clothes to the hospital/surgery center.   Remember to brush your teeth WITH YOUR REGULAR TOOTHPASTE.  Please read over the following fact sheets that you were given.

## 2019-01-09 ENCOUNTER — Encounter (HOSPITAL_COMMUNITY)
Admission: RE | Admit: 2019-01-09 | Discharge: 2019-01-09 | Disposition: A | Discharge status: Home / Self Care | Payer: BC Managed Care – PPO | Source: Ambulatory Visit | Attending: General Surgery | Admitting: General Surgery

## 2019-01-09 ENCOUNTER — Encounter (HOSPITAL_COMMUNITY): Payer: Self-pay

## 2019-01-09 ENCOUNTER — Other Ambulatory Visit: Payer: Self-pay

## 2019-01-09 DIAGNOSIS — Z01812 Encounter for preprocedural laboratory examination: Secondary | ICD-10-CM | POA: Insufficient documentation

## 2019-01-09 HISTORY — DX: Anemia, unspecified: D64.9

## 2019-01-09 LAB — CBC
HEMATOCRIT: 27.3 % — AB (ref 39.0–52.0)
Hemoglobin: 7.7 g/dL — ABNORMAL LOW (ref 13.0–17.0)
MCH: 21.8 pg — AB (ref 26.0–34.0)
MCHC: 28.2 g/dL — ABNORMAL LOW (ref 30.0–36.0)
MCV: 77.1 fL — ABNORMAL LOW (ref 80.0–100.0)
Platelets: 317 10*3/uL (ref 150–400)
RBC: 3.54 MIL/uL — ABNORMAL LOW (ref 4.22–5.81)
RDW: 16.2 % — ABNORMAL HIGH (ref 11.5–15.5)
WBC: 6.8 10*3/uL (ref 4.0–10.5)
nRBC: 0 % (ref 0.0–0.2)

## 2019-01-09 LAB — PROTIME-INR
INR: 1.22
Prothrombin Time: 15.3 seconds — ABNORMAL HIGH (ref 11.4–15.2)

## 2019-01-09 LAB — APTT: aPTT: 36 seconds (ref 24–36)

## 2019-01-09 NOTE — Progress Notes (Signed)
PCP - Dr. Maury Dus Cardiologist - denies  Chest x-ray - 12/31/18 EKG - 12/31/18 Stress Test - denies ECHO - denies Cardiac Cath - denies  Sleep Study - denies  Blood Thinner Instructions: N/A Aspirin Instructions:N/A  Anesthesia review: Yes, EKG review and recent ED visit for CP.   Pt recently seen in ED (12/31/18) for fever, anemia and chest tightness. Had been sick since December with a URI. Pt has stated all symptoms have resolved. Was sent to f/u with oncologist, Dr. Maylon Peppers.  Patient denies shortness of breath, fever, cough and chest pain at PAT appointment   Patient verbalized understanding of instructions that were given to them at the PAT appointment. Patient was also instructed that they will need to review over the PAT instructions again at home before surgery.

## 2019-01-10 NOTE — Progress Notes (Signed)
Anesthesia Chart Review:  Case:  [id number] Date/Time:  01/12/19 1145   Procedure:  EXCISION OF LEFT SUPRACLAVICULAR MASS (Left )   Anesthesia type:  General   Pre-op diagnosis:  LYMPHADENOPATHY   Location:  [facility] OR ROOM 02 / [facility]   Surgeon:  Fanny Skates, MD      DISCUSSION: 53 yo male never smoker. Pertinent hx includes GERD, Anemia.  Patient presented to ER 12/31/18 for fever and generalized weakness; CT neck and CAP showed lymphadenopathy in the cervical lymph nodes (left supraclavicular LN ~3 x 1.5 cm), bilateral soft tissue masses surrounding the kidneys, retroperitoneal and the right internal iliac lymphadenopathy.  Pt was then seen by Oncology 01/03/19. Concern for lymphoproliferative disease. Referred to general surgery for excisional LN biopsy.  Hgb 8.6 in the ER; no prior recent labs for comparison except CBC in 2018 (Hgb normal at that time). Iron profile c/w anemia of chronic disease. Per oncology note "We will prioritize work-up for possible lymphoproliferative disease above, and if the work-up is unremarkable, then we can reassess any further evaluation for GI bleeding as needed."  Hgb lower on PAT labs, 7.7 on 01/09/19. Dr. Dalbert Batman notified.  DOS CBC and T&S ordered.  VS: BP (!) 152/82   Pulse 85   Temp 37 C   Resp 18   Ht 5\' 9"  (1.753 m)   Wt 95 kg   SpO2 99%   BMI 30.92 kg/m   PROVIDERS: Maury Dus, MD is PCP  Tish Men, MD is Oncologist  LABS: Significant anemia, Dr. Dalbert Batman notified.  (all labs ordered are listed, but only abnormal results are displayed)  Labs Reviewed  PROTIME-INR - Abnormal; Notable for the following components:      Result Value   Prothrombin Time 15.3 (*)    All other components within normal limits  CBC - Abnormal; Notable for the following components:   RBC 3.54 (*)    Hemoglobin 7.7 (*)    HCT 27.3 (*)    MCV 77.1 (*)    MCH 21.8 (*)    MCHC 28.2 (*)    RDW 16.2 (*)    All other components within normal limits  APTT      IMAGES: CT Chest/abd/pelvis 12/31/2018: IMPRESSION: 1. 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 Abnormal soft tissue associated with both kidneys and surrounding the proximal left ureter is also likely due to lymphoma. This soft tissue associated with the kidneys and left ureter results in mild bilateral hydronephrosis, left greater than right. 2. Small pleural effusions, mild pulmonary edema, ascites, and fluid and stranding in the mesentery is all likely due to third spacing of fluid. The mesenteric findings surrounds the pancreas but is probably part of the broader process of fluid third-spacing. Recommend clinical correlation to exclude signs of pancreatitis. If there is any concern for pancreatitis, recommend obtaining a lipase. 3. Small pulmonary nodules as above. 4. No pulmonary emboli. 5. Coronary artery calcifications.  EKG: 12/31/2018: Sinus rhythm. Rate 89. Borderline short PR interval. RSR' in V1 or V2, right VCD or RVH.  CV: N/A   Past Medical History:  Diagnosis Date  . Anemia   . Bilateral inguinal hernia 06/08/2017  . GERD (gastroesophageal reflux disease)     Past Surgical History:  Procedure Laterality Date  . EYE SURGERY     lasik eye surgery 12 years ago  . GANGLION CYST EXCISION Left 1984   Wrist  . HERNIA REPAIR    .  INGUINAL HERNIA REPAIR Bilateral 06/08/2017   Procedure: OPEN REPAIR BILARTERAL INGUINAL HERNIA WITH MESH;  Surgeon: Fanny Skates, MD;  Location: [facility]S;  Service: General;  Laterality: Bilateral;  . INSERTION OF MESH Bilateral 06/08/2017   Procedure: INSERTION OF MESH;  Surgeon: Fanny Skates, MD;  Location: [facility]S;  Service: General;  Laterality: Bilateral;    MEDICATIONS: . acetaminophen (TYLENOL) 325 MG tablet  . ferrous sulfate 325 (65 FE) MG EC tablet  . HYDROcodone-acetaminophen (NORCO) 5-325 MG tablet  . LORazepam (ATIVAN) 0.5 MG tablet   No current  facility-administered medications for this encounter.      Wynonia Musty [facility] Short Stay Center/Anesthesiology Phone [phone] 01/10/2019 1:12 PM

## 2019-01-10 NOTE — Anesthesia Preprocedure Evaluation (Addendum)
Anesthesia Evaluation  Patient identified by MRN, date of birth, ID band Patient awake    Reviewed: Allergy & Precautions, NPO status , Patient's Chart, lab work & pertinent test results  History of Anesthesia Complications Negative for: history of anesthetic complications  Airway Mallampati: II  TM Distance: >3 FB Neck ROM: Full    Dental  (+) Teeth Intact, Dental Advisory Given   Pulmonary neg pulmonary ROS,    Pulmonary exam normal breath sounds clear to auscultation       Cardiovascular negative cardio ROS Normal cardiovascular exam Rhythm:Regular Rate:Normal     Neuro/Psych negative neurological ROS     GI/Hepatic negative GI ROS, Neg liver ROS, neg GERD  ,  Endo/Other  negative endocrine ROS  Renal/GU negative Renal ROS     Musculoskeletal Left supraclavicular mass   Abdominal   Peds  Hematology  (+) anemia , Hgb 7.7, lymphadenopathy concerning for malignancy   Anesthesia Other Findings Day of surgery medications reviewed with the patient.  Reproductive/Obstetrics                            Anesthesia Physical Anesthesia Plan  ASA: III  Anesthesia Plan: General   Post-op Pain Management:    Induction: Intravenous  PONV Risk Score and Plan: 2 and Treatment may vary due to age or medical condition, Ondansetron, Dexamethasone and Midazolam  Airway Management Planned: Oral ETT  Additional Equipment:   Intra-op Plan:   Post-operative Plan: Extubation in OR  Informed Consent: I have reviewed the patients History and Physical, chart, labs and discussed the procedure including the risks, benefits and alternatives for the proposed anesthesia with the patient or authorized representative who has indicated his/her understanding and acceptance.     Dental advisory given  Plan Discussed with: CRNA  Anesthesia Plan Comments: (See PAT note by Karoline Caldwell, PA-C )       Anesthesia Quick Evaluation

## 2019-01-11 ENCOUNTER — Encounter (HOSPITAL_COMMUNITY): Payer: Self-pay | Admitting: *Deleted

## 2019-01-12 ENCOUNTER — Encounter (HOSPITAL_COMMUNITY): Payer: Self-pay | Admitting: Anesthesiology

## 2019-01-12 ENCOUNTER — Encounter (HOSPITAL_COMMUNITY)
Admission: RE | Disposition: A | Discharge status: Home / Self Care | Payer: Self-pay | Source: Home / Self Care | Attending: General Surgery

## 2019-01-12 ENCOUNTER — Ambulatory Visit (HOSPITAL_COMMUNITY): Payer: BC Managed Care – PPO | Admitting: Physician Assistant

## 2019-01-12 ENCOUNTER — Ambulatory Visit (HOSPITAL_COMMUNITY)
Admission: RE | Admit: 2019-01-12 | Discharge: 2019-01-12 | Disposition: A | Discharge status: Home / Self Care | Payer: BC Managed Care – PPO | Attending: General Surgery | Admitting: General Surgery

## 2019-01-12 ENCOUNTER — Other Ambulatory Visit: Payer: Self-pay | Admitting: Hematology

## 2019-01-12 DIAGNOSIS — Z791 Long term (current) use of non-steroidal anti-inflammatories (NSAID): Secondary | ICD-10-CM | POA: Insufficient documentation

## 2019-01-12 DIAGNOSIS — R490 Dysphonia: Secondary | ICD-10-CM | POA: Diagnosis not present

## 2019-01-12 DIAGNOSIS — R918 Other nonspecific abnormal finding of lung field: Secondary | ICD-10-CM | POA: Diagnosis not present

## 2019-01-12 DIAGNOSIS — N1330 Unspecified hydronephrosis: Secondary | ICD-10-CM | POA: Diagnosis not present

## 2019-01-12 DIAGNOSIS — C8330 Diffuse large B-cell lymphoma, unspecified site: Secondary | ICD-10-CM | POA: Diagnosis present

## 2019-01-12 DIAGNOSIS — Z880 Allergy status to penicillin: Secondary | ICD-10-CM | POA: Insufficient documentation

## 2019-01-12 DIAGNOSIS — C8332 Diffuse large B-cell lymphoma, intrathoracic lymph nodes: Secondary | ICD-10-CM | POA: Insufficient documentation

## 2019-01-12 DIAGNOSIS — C8590 Non-Hodgkin lymphoma, unspecified, unspecified site: Secondary | ICD-10-CM | POA: Diagnosis present

## 2019-01-12 DIAGNOSIS — Z79899 Other long term (current) drug therapy: Secondary | ICD-10-CM | POA: Diagnosis not present

## 2019-01-12 DIAGNOSIS — D649 Anemia, unspecified: Secondary | ICD-10-CM | POA: Diagnosis not present

## 2019-01-12 DIAGNOSIS — R0789 Other chest pain: Secondary | ICD-10-CM | POA: Insufficient documentation

## 2019-01-12 DIAGNOSIS — R188 Other ascites: Secondary | ICD-10-CM | POA: Insufficient documentation

## 2019-01-12 DIAGNOSIS — R591 Generalized enlarged lymph nodes: Secondary | ICD-10-CM

## 2019-01-12 HISTORY — PX: MASS EXCISION: SHX2000

## 2019-01-12 LAB — CBC
HCT: 25.6 % — ABNORMAL LOW (ref 39.0–52.0)
Hemoglobin: 7.3 g/dL — ABNORMAL LOW (ref 13.0–17.0)
MCH: 21.6 pg — ABNORMAL LOW (ref 26.0–34.0)
MCHC: 28.5 g/dL — ABNORMAL LOW (ref 30.0–36.0)
MCV: 75.7 fL — ABNORMAL LOW (ref 80.0–100.0)
Platelets: 291 K/uL (ref 150–400)
RBC: 3.38 MIL/uL — ABNORMAL LOW (ref 4.22–5.81)
RDW: 16.4 % — ABNORMAL HIGH (ref 11.5–15.5)
WBC: 5.3 K/uL (ref 4.0–10.5)
nRBC: 0 % (ref 0.0–0.2)

## 2019-01-12 LAB — TYPE AND SCREEN
ABO/RH(D): A POS
Antibody Screen: NEGATIVE

## 2019-01-12 LAB — ABO/RH: ABO/RH(D): A POS

## 2019-01-12 SURGERY — EXCISION MASS
Anesthesia: General | Site: Neck | Laterality: Left

## 2019-01-12 MED ORDER — 0.9 % SODIUM CHLORIDE (POUR BTL) OPTIME
TOPICAL | Status: DC | PRN
  Administered 2019-01-12: 1000 mL

## 2019-01-12 MED ORDER — SODIUM CHLORIDE 0.9% FLUSH: 3.0000 mL | INTRAVENOUS | Status: DC | PRN

## 2019-01-12 MED ORDER — CHLORHEXIDINE GLUCONATE CLOTH 2 % EX PADS: 6.0000 | MEDICATED_PAD | Freq: Once | CUTANEOUS | Status: DC

## 2019-01-12 MED ORDER — MIDAZOLAM HCL 2 MG/2ML IJ SOLN
INTRAMUSCULAR | Status: AC
  Filled 2019-01-12: qty 2

## 2019-01-12 MED ORDER — ACETAMINOPHEN 500 MG PO TABS: 1000.0000 mg | ORAL_TABLET | Freq: Four times a day (QID) | ORAL | Status: DC

## 2019-01-12 MED ORDER — SODIUM CHLORIDE 0.9 % IV SOLN: 250.0000 mL | INTRAVENOUS | Status: DC | PRN

## 2019-01-12 MED ORDER — SODIUM CHLORIDE 0.9% FLUSH: 3.0000 mL | Freq: Two times a day (BID) | INTRAVENOUS | Status: DC

## 2019-01-12 MED ORDER — OXYCODONE HCL 5 MG PO TABS: 5.0000 mg | ORAL_TABLET | ORAL | Status: DC | PRN

## 2019-01-12 MED ORDER — HEMOSTATIC AGENTS (NO CHARGE) OPTIME
TOPICAL | Status: DC | PRN
  Administered 2019-01-12: 1 via TOPICAL

## 2019-01-12 MED ORDER — ONDANSETRON HCL 4 MG/2ML IJ SOLN
INTRAMUSCULAR | Status: DC | PRN
  Administered 2019-01-12: 4 mg via INTRAVENOUS

## 2019-01-12 MED ORDER — GABAPENTIN 300 MG PO CAPS
300.0000 mg | ORAL_CAPSULE | ORAL | Status: AC
  Administered 2019-01-12: 300 mg via ORAL

## 2019-01-12 MED ORDER — GABAPENTIN 300 MG PO CAPS
ORAL_CAPSULE | ORAL | Status: AC
  Filled 2019-01-12: qty 1

## 2019-01-12 MED ORDER — OXYCODONE HCL 5 MG PO TABS: 5.0000 mg | ORAL_TABLET | Freq: Once | ORAL | Status: DC | PRN

## 2019-01-12 MED ORDER — LACTATED RINGERS IV SOLN
INTRAVENOUS | Status: DC
  Administered 2019-01-12 (×2): via INTRAVENOUS

## 2019-01-12 MED ORDER — LIDOCAINE 2% (20 MG/ML) 5 ML SYRINGE
INTRAMUSCULAR | Status: AC
  Filled 2019-01-12: qty 5

## 2019-01-12 MED ORDER — PROMETHAZINE HCL 25 MG/ML IJ SOLN: 6.2500 mg | INTRAMUSCULAR | Status: DC | PRN

## 2019-01-12 MED ORDER — ACETAMINOPHEN 500 MG PO TABS
1000.0000 mg | ORAL_TABLET | ORAL | Status: AC
  Administered 2019-01-12: 1000 mg via ORAL

## 2019-01-12 MED ORDER — MIDAZOLAM HCL 5 MG/5ML IJ SOLN
INTRAMUSCULAR | Status: DC | PRN
  Administered 2019-01-12: 2 mg via INTRAVENOUS

## 2019-01-12 MED ORDER — BUPIVACAINE HCL (PF) 0.25 % IJ SOLN
INTRAMUSCULAR | Status: DC | PRN
  Administered 2019-01-12: 5 mL

## 2019-01-12 MED ORDER — OXYCODONE HCL 5 MG/5ML PO SOLN: 5.0000 mg | Freq: Once | ORAL | Status: DC | PRN

## 2019-01-12 MED ORDER — ACETAMINOPHEN 10 MG/ML IV SOLN: 1000.0000 mg | Freq: Once | INTRAVENOUS | Status: DC | PRN

## 2019-01-12 MED ORDER — FENTANYL CITRATE (PF) 100 MCG/2ML IJ SOLN
INTRAMUSCULAR | Status: DC | PRN
  Administered 2019-01-12 (×2): 50 ug via INTRAVENOUS
  Administered 2019-01-12: 100 ug via INTRAVENOUS
  Administered 2019-01-12: 50 ug via INTRAVENOUS

## 2019-01-12 MED ORDER — FENTANYL CITRATE (PF) 100 MCG/2ML IJ SOLN: 25.0000 ug | INTRAMUSCULAR | Status: DC | PRN

## 2019-01-12 MED ORDER — ROCURONIUM BROMIDE 10 MG/ML (PF) SYRINGE
PREFILLED_SYRINGE | INTRAVENOUS | Status: DC | PRN
  Administered 2019-01-12: 50 mg via INTRAVENOUS

## 2019-01-12 MED ORDER — PROPOFOL 10 MG/ML IV BOLUS
INTRAVENOUS | Status: DC | PRN
  Administered 2019-01-12: 200 mg via INTRAVENOUS

## 2019-01-12 MED ORDER — ROCURONIUM BROMIDE 50 MG/5ML IV SOSY
PREFILLED_SYRINGE | INTRAVENOUS | Status: AC
  Filled 2019-01-12: qty 5

## 2019-01-12 MED ORDER — LACTATED RINGERS IV SOLN: INTRAVENOUS | Status: DC

## 2019-01-12 MED ORDER — MIDAZOLAM HCL 5 MG/5ML IJ SOLN: INTRAMUSCULAR | Status: DC | PRN

## 2019-01-12 MED ORDER — CEFAZOLIN SODIUM-DEXTROSE 2-4 GM/100ML-% IV SOLN
INTRAVENOUS | Status: AC
  Filled 2019-01-12: qty 100

## 2019-01-12 MED ORDER — ONDANSETRON HCL 4 MG/2ML IJ SOLN
INTRAMUSCULAR | Status: AC
  Filled 2019-01-12: qty 2

## 2019-01-12 MED ORDER — CELECOXIB 200 MG PO CAPS
ORAL_CAPSULE | ORAL | Status: AC
  Filled 2019-01-12: qty 1

## 2019-01-12 MED ORDER — SUGAMMADEX SODIUM 200 MG/2ML IV SOLN
INTRAVENOUS | Status: DC | PRN
  Administered 2019-01-12: 200 mg via INTRAVENOUS

## 2019-01-12 MED ORDER — ACETAMINOPHEN 500 MG PO TABS
ORAL_TABLET | ORAL | Status: AC
  Filled 2019-01-12: qty 2

## 2019-01-12 MED ORDER — ACETAMINOPHEN 650 MG RE SUPP: 650.0000 mg | RECTAL | Status: DC | PRN

## 2019-01-12 MED ORDER — HYDROCODONE-ACETAMINOPHEN 5-325 MG PO TABS: 1.0000 | ORAL_TABLET | Freq: Four times a day (QID) | ORAL | 0 refills | Status: DC | PRN

## 2019-01-12 MED ORDER — FENTANYL CITRATE (PF) 250 MCG/5ML IJ SOLN
INTRAMUSCULAR | Status: AC
  Filled 2019-01-12: qty 5

## 2019-01-12 MED ORDER — DEXAMETHASONE SODIUM PHOSPHATE 10 MG/ML IJ SOLN
INTRAMUSCULAR | Status: AC
  Filled 2019-01-12: qty 1

## 2019-01-12 MED ORDER — DEXAMETHASONE SODIUM PHOSPHATE 4 MG/ML IJ SOLN
INTRAMUSCULAR | Status: DC | PRN
  Administered 2019-01-12: 5 mg via INTRAVENOUS

## 2019-01-12 MED ORDER — ACETAMINOPHEN 325 MG PO TABS: 650.0000 mg | ORAL_TABLET | ORAL | Status: DC | PRN

## 2019-01-12 MED ORDER — CELECOXIB 200 MG PO CAPS
200.0000 mg | ORAL_CAPSULE | ORAL | Status: AC
  Administered 2019-01-12: 200 mg via ORAL

## 2019-01-12 MED ORDER — BUPIVACAINE HCL (PF) 0.25 % IJ SOLN
INTRAMUSCULAR | Status: AC
  Filled 2019-01-12: qty 30

## 2019-01-12 MED ORDER — LIDOCAINE 2% (20 MG/ML) 5 ML SYRINGE
INTRAMUSCULAR | Status: DC | PRN
  Administered 2019-01-12: 60 mg via INTRAVENOUS

## 2019-01-12 MED ORDER — VANCOMYCIN HCL IN DEXTROSE 1-5 GM/200ML-% IV SOLN
1000.0000 mg | Freq: Once | INTRAVENOUS | Status: AC
  Administered 2019-01-12: 1000 mg via INTRAVENOUS
  Filled 2019-01-12: qty 200

## 2019-01-12 SURGICAL SUPPLY — 38 items
APPLIER CLIP 9.375 MED OPEN (MISCELLANEOUS) ×4
BENZOIN TINCTURE PRP APPL 2/3 (GAUZE/BANDAGES/DRESSINGS) ×2 IMPLANT
CANISTER SUCT 3000ML PPV (MISCELLANEOUS) IMPLANT
CHLORAPREP W/TINT 26ML (MISCELLANEOUS) ×2 IMPLANT
CLIP APPLIE 9.375 MED OPEN (MISCELLANEOUS) ×2 IMPLANT
COVER SURGICAL LIGHT HANDLE (MISCELLANEOUS) ×2 IMPLANT
COVER WAND RF STERILE (DRAPES) ×2 IMPLANT
DECANTER SPIKE VIAL GLASS SM (MISCELLANEOUS) IMPLANT
DERMABOND ADVANCED (GAUZE/BANDAGES/DRESSINGS) ×1
DERMABOND ADVANCED .7 DNX12 (GAUZE/BANDAGES/DRESSINGS) ×1 IMPLANT
DRAPE LAPAROTOMY 100X72 PEDS (DRAPES) ×2 IMPLANT
ELECT REM PT RETURN 9FT ADLT (ELECTROSURGICAL) ×2
ELECTRODE REM PT RTRN 9FT ADLT (ELECTROSURGICAL) ×1 IMPLANT
GAUZE 4X4 16PLY RFD (DISPOSABLE) ×2 IMPLANT
GAUZE SPONGE 4X4 12PLY STRL (GAUZE/BANDAGES/DRESSINGS) ×2 IMPLANT
GLOVE EUDERMIC 7 POWDERFREE (GLOVE) ×2 IMPLANT
GOWN STRL REUS W/ TWL LRG LVL3 (GOWN DISPOSABLE) ×2 IMPLANT
GOWN STRL REUS W/ TWL XL LVL3 (GOWN DISPOSABLE) ×1 IMPLANT
GOWN STRL REUS W/TWL LRG LVL3 (GOWN DISPOSABLE) ×2
GOWN STRL REUS W/TWL XL LVL3 (GOWN DISPOSABLE) ×1
HEMOSTAT ARISTA ABSORB 3G PWDR (HEMOSTASIS) ×2 IMPLANT
KIT TURNOVER KIT B (KITS) ×2 IMPLANT
NEEDLE HYPO 25GX1X1/2 BEV (NEEDLE) IMPLANT
NS IRRIG 1000ML POUR BTL (IV SOLUTION) ×2 IMPLANT
PACK GENERAL/GYN (CUSTOM PROCEDURE TRAY) ×2 IMPLANT
PAD ARMBOARD 7.5X6 YLW CONV (MISCELLANEOUS) ×4 IMPLANT
PENCIL SMOKE EVACUATOR (MISCELLANEOUS) ×2 IMPLANT
SPECIMEN JAR SMALL (MISCELLANEOUS) ×2 IMPLANT
SPONGE LAP 4X18 RFD (DISPOSABLE) IMPLANT
STAPLER VISISTAT 35W (STAPLE) IMPLANT
STRIP CLOSURE SKIN 1/2X4 (GAUZE/BANDAGES/DRESSINGS) ×2 IMPLANT
SUT MNCRL AB 4-0 PS2 18 (SUTURE) ×2 IMPLANT
SUT VIC AB 3-0 SH 18 (SUTURE) ×2 IMPLANT
SUT VIC AB 3-0 SH 27 (SUTURE) ×1
SUT VIC AB 3-0 SH 27XBRD (SUTURE) ×1 IMPLANT
SYR CONTROL 10ML LL (SYRINGE) IMPLANT
TOWEL OR 17X24 6PK STRL BLUE (TOWEL DISPOSABLE) ×2 IMPLANT
TOWEL OR 17X26 10 PK STRL BLUE (TOWEL DISPOSABLE) ×2 IMPLANT

## 2019-01-12 NOTE — Interval H&P Note (Signed)
History and Physical Interval Note:  01/12/2019 12:54 PM  Paul Mason  has presented today for surgery, with the diagnosis of LYMPHADENOPATHY  The various methods of treatment have been discussed with the patient and family. After consideration of risks, benefits and other options for treatment, the patient has consented to  Procedure(s): EXCISION OF LEFT SUPRACLAVICULAR MASS (Left) as a surgical intervention .  The patient's history has been reviewed, patient examined, no change in status, stable for surgery.  I have reviewed the patient's chart and labs.  Questions were answered to the patient's satisfaction.     [name]

## 2019-01-12 NOTE — Transfer of Care (Signed)
Immediate Anesthesia Transfer of Care Note  Patient: Paul Mason  Procedure(s) Performed: EXCISION OF LEFT SUPRACLAVICULAR MASS (Left Neck)  Patient Location: PACU  Anesthesia Type:General  Level of Consciousness: drowsy and patient cooperative  Airway & Oxygen Therapy: Patient Spontanous Breathing and Patient connected to nasal cannula oxygen  Post-op Assessment: Report given to RN and Post -op Vital signs reviewed and stable  Post vital signs: Reviewed and stable  Last Vitals:  Vitals Value Taken Time  BP 145/92 01/12/2019  3:19 PM  Temp 36.4 C 01/12/2019  3:19 PM  Pulse 84 01/12/2019  3:22 PM  Resp 16 01/12/2019  3:22 PM  SpO2 97 % 01/12/2019  3:22 PM  Vitals shown include unvalidated device data.  Last Pain:  Vitals:   01/12/19 1519  TempSrc:   PainSc: (P) Asleep         Complications: No apparent anesthesia complications

## 2019-01-12 NOTE — Op Note (Signed)
Patient Name:           Paul Mason   Date of Surgery:        01/12/2019  Pre op Diagnosis:      Lymphoma  Post op Diagnosis:    Lymphoma  Procedure:                 Excision deep left supraclavicular lymph node  Surgeon:                     [name]. Dalbert Batman, M.D., FACS  Assistant:                      OR staff  Operative Indications: He is referred by Dr. Maylon Peppers at the [facility] for consideration of left cervical lymph node biopsy. Lymphoma is suspected.  Over the past few weeks he is developed some sweats and progressive fatigue and a little bit of fever. He reported some chest tightness and intermittent left shoulder pain which did not improve with anti-inflammatories. His weight has been stable. He was seen in the ED. CT scan of neck chest abdomen and pelvis and CT angiogram have been done. Left supraclavicular lymph nodes are present and are most prominent, approaching 3 cm. Abnormal soft tissue masses surrounding both kidneys. Retroperitoneal adenopathy. Right internal iliac adenopathy. Mild bilateral hydronephrosis. Small amount of pleural fluid and ascites. Small pulmonary nodules. No pulmonary emboli. PET scan is scheduled for February 3 Lab work reveals platelet count 276,000. Hemoglobin 8.3. White count 5900. Liver function tests, creatinine normal. LDH significantly elevated. Iron level significantly depressed. HIV negative. Hepatitis B antibody positive suggesting prior infection      Exam revealed prominent but soft adenopathy in the lateral left supraclavicular area. No significant peripheral adenopathy elsewhere. No organomegaly He wants to go ahead as soon as possible with lymph node biopsy to establish his diagnosis He will be scheduled for left supraclavicular lymph node biopsy under general anesthesia in the near future  Operative Findings:       There was a 1.0 x 2.0 cm very soft but pathologically enlarged lymph node in the left  supraclavicular area laterally.  This was somewhat deep and dissection was a bit slow and tedious.  I had to sacrifice one small slip of the supraclavicular sensory nerves.  I suspect that I removed about 4 g of lymph node tissue and this was sent fresh to the lab for lymphoma work-up  Procedure in Detail:          Following the induction of general endotracheal anesthesia the patient was positioned with his left arm tucked at his side and rolls behind his shoulders and the head turned to the right.  The entire neck and shoulder were prepped and draped in a sterile fashion.  Surgical timeout was performed.  Intravenous antibiotics were given.      A transverse incision was made in the left anterolateral neck in Langer's lines.  This was low and far lateral to the sternocleidomastoid muscle.  Dissection was carried down through the platysma muscle.  Slowly dissected off the fascia on top of the lymph node .  A small crossing vein was clamped divided and ligated with Vicryl ties.  A small slip of the supraclavicular nerves had to be divided for exposure.  We then slowly dissected the lymph node out from behind the clavicle.  Small attachments were controlled with metal clips and divided.  We appeared  to be very anterior to the brachial plexus which was not seen.  The specimen was sent to the lab.  The wound was irrigated.  A few bleeders were controlled with cautery.  There did not appear to be any lymphatic drainage.  I sprayed Arista hemostatic powder into the wound and this seemed to work quite well.  The platysma was closed with interrupted 3-0 Vicryl and the skin closed with a running subcuticular 4-0 Monocryl and Dermabond.  The patient was sent to the PACU in stable condition.  EBL 20 cc.  Counts correct.  Complications none   Addendum: I logged onto the PMP aware website and reviewed his prescription medication history     Paul Mason M. Dalbert Batman, M.D., FACS General and Minimally Invasive Surgery Breast  and Colorectal Surgery  01/12/2019 3:13 PM

## 2019-01-12 NOTE — Anesthesia Procedure Notes (Signed)
Procedure Name: Intubation Date/Time: 01/12/2019 2:05 PM Performed by: Eulas Post, Rashiya Lofland W, CRNA Pre-anesthesia Checklist: Patient identified, Emergency Drugs available, Suction available and Patient being monitored Patient Re-evaluated:Patient Re-evaluated prior to induction Oxygen Delivery Method: Circle system utilized Preoxygenation: Pre-oxygenation with 100% oxygen Induction Type: IV induction Ventilation: Mask ventilation without difficulty Laryngoscope Size: Miller, 2 and Glidescope Grade View: Grade I Tube type: Oral Number of attempts: 1 Airway Equipment and Method: Video-laryngoscopy Placement Confirmation: ETT inserted through vocal cords under direct vision,  positive ETCO2 and breath sounds checked- equal and bilateral Secured at: 23 cm Tube secured with: Tape Dental Injury: Teeth and Oropharynx as per pre-operative assessment

## 2019-01-12 NOTE — Interval H&P Note (Signed)
History and Physical Interval Note:  01/12/2019 12:55 PM  Paul Mason  has presented today for surgery, with the diagnosis of LYMPHADENOPATHY  The various methods of treatment have been discussed with the patient and family. After consideration of risks, benefits and other options for treatment, the patient has consented to  Procedure(s): EXCISION OF LEFT SUPRACLAVICULAR MASS (Left) as a surgical intervention .  The patient's history has been reviewed, patient examined, no change in status, stable for surgery.  I have reviewed the patient's chart and labs.  Questions were answered to the patient's satisfaction.     [name]

## 2019-01-12 NOTE — Discharge Instructions (Signed)
Elevate head of bed or sleep in a reclining chair for 24 hours  Ice pack to left neck for 10 minutes at a time, 3-4 times an hour  You may shower, starting on Sunday No tub baths  No driving for about a week, since your neck may be sore and you may not be able to turn your head very well   Call if you have any fever, unusual swelling, or severe pain

## 2019-01-13 NOTE — Anesthesia Postprocedure Evaluation (Signed)
Anesthesia Post Note  Patient: Paul Mason  Procedure(s) Performed: EXCISION OF LEFT SUPRACLAVICULAR MASS (Left Neck)     Patient location during evaluation: PACU Anesthesia Type: General Level of consciousness: awake and alert Pain management: pain level controlled Vital Signs Assessment: post-procedure vital signs reviewed and stable Respiratory status: spontaneous breathing, nonlabored ventilation, respiratory function stable and patient connected to nasal cannula oxygen Cardiovascular status: blood pressure returned to baseline and stable Postop Assessment: no apparent nausea or vomiting Anesthetic complications: no    Last Vitals:  Vitals:   01/12/19 1532 01/12/19 1600  BP: 134/85 134/81  Pulse: 85 84  Resp: 18 13  Temp:  (!) 36.4 C  SpO2: 94% 95%    Last Pain:  Vitals:   01/12/19 1532  TempSrc:   PainSc: 0-No pain                 Aalijah Mims

## 2019-01-15 ENCOUNTER — Other Ambulatory Visit: Payer: Self-pay | Admitting: Hematology

## 2019-01-15 ENCOUNTER — Encounter (HOSPITAL_COMMUNITY)
Admission: RE | Admit: 2019-01-15 | Discharge: 2019-01-15 | Disposition: A | Discharge status: Home / Self Care | Payer: BC Managed Care – PPO | Source: Ambulatory Visit | Attending: Hematology | Admitting: Hematology

## 2019-01-15 DIAGNOSIS — R599 Enlarged lymph nodes, unspecified: Secondary | ICD-10-CM | POA: Diagnosis not present

## 2019-01-15 DIAGNOSIS — D509 Iron deficiency anemia, unspecified: Secondary | ICD-10-CM

## 2019-01-15 DIAGNOSIS — R591 Generalized enlarged lymph nodes: Secondary | ICD-10-CM

## 2019-01-15 LAB — GLUCOSE, CAPILLARY: Glucose-Capillary: 94 mg/dL (ref 70–99)

## 2019-01-15 MED ORDER — FLUDEOXYGLUCOSE F - 18 (FDG) INJECTION
10.9000 | Freq: Once | INTRAVENOUS | Status: AC | PRN
  Administered 2019-01-15: 10.9 via INTRAVENOUS

## 2019-01-16 ENCOUNTER — Encounter (HOSPITAL_COMMUNITY): Payer: Self-pay | Admitting: General Surgery

## 2019-01-17 ENCOUNTER — Other Ambulatory Visit: Payer: BC Managed Care – PPO

## 2019-01-17 ENCOUNTER — Telehealth: Payer: Self-pay | Admitting: Hematology

## 2019-01-17 ENCOUNTER — Inpatient Hospital Stay: Payer: BC Managed Care – PPO | Admitting: Hematology

## 2019-01-17 NOTE — Telephone Encounter (Signed)
Spoke with patient regarding appointments added to his schedule for 2/6 @ [facility].  He voiced understanding per 2/5 staff message

## 2019-01-18 ENCOUNTER — Inpatient Hospital Stay: Payer: BC Managed Care – PPO | Attending: Hematology

## 2019-01-18 ENCOUNTER — Inpatient Hospital Stay (HOSPITAL_BASED_OUTPATIENT_CLINIC_OR_DEPARTMENT_OTHER): Payer: BC Managed Care – PPO | Admitting: Hematology & Oncology

## 2019-01-18 ENCOUNTER — Encounter: Payer: Self-pay | Admitting: Hematology & Oncology

## 2019-01-18 ENCOUNTER — Other Ambulatory Visit: Payer: Self-pay | Admitting: Hematology

## 2019-01-18 ENCOUNTER — Other Ambulatory Visit: Payer: Self-pay

## 2019-01-18 ENCOUNTER — Other Ambulatory Visit: Payer: BC Managed Care – PPO

## 2019-01-18 ENCOUNTER — Inpatient Hospital Stay: Payer: BC Managed Care – PPO

## 2019-01-18 VITALS — BP 152/69 | HR 96 | Temp 98.2°F | Resp 18 | Wt 190.0 lb

## 2019-01-18 DIAGNOSIS — D649 Anemia, unspecified: Secondary | ICD-10-CM | POA: Diagnosis not present

## 2019-01-18 DIAGNOSIS — Z5112 Encounter for antineoplastic immunotherapy: Secondary | ICD-10-CM | POA: Diagnosis not present

## 2019-01-18 DIAGNOSIS — R740 Nonspecific elevation of levels of transaminase and lactic acid dehydrogenase [LDH]: Secondary | ICD-10-CM

## 2019-01-18 DIAGNOSIS — Z5189 Encounter for other specified aftercare: Secondary | ICD-10-CM | POA: Diagnosis not present

## 2019-01-18 DIAGNOSIS — C8330 Diffuse large B-cell lymphoma, unspecified site: Secondary | ICD-10-CM

## 2019-01-18 DIAGNOSIS — D701 Agranulocytosis secondary to cancer chemotherapy: Secondary | ICD-10-CM | POA: Insufficient documentation

## 2019-01-18 DIAGNOSIS — D6481 Anemia due to antineoplastic chemotherapy: Secondary | ICD-10-CM | POA: Diagnosis not present

## 2019-01-18 DIAGNOSIS — D509 Iron deficiency anemia, unspecified: Secondary | ICD-10-CM | POA: Insufficient documentation

## 2019-01-18 DIAGNOSIS — Z5111 Encounter for antineoplastic chemotherapy: Secondary | ICD-10-CM | POA: Diagnosis present

## 2019-01-18 DIAGNOSIS — R591 Generalized enlarged lymph nodes: Secondary | ICD-10-CM

## 2019-01-18 DIAGNOSIS — K521 Toxic gastroenteritis and colitis: Secondary | ICD-10-CM | POA: Diagnosis not present

## 2019-01-18 DIAGNOSIS — D508 Other iron deficiency anemias: Secondary | ICD-10-CM

## 2019-01-18 HISTORY — DX: Iron deficiency anemia, unspecified: D50.9

## 2019-01-18 LAB — CMP (CANCER CENTER ONLY)
ALT: 15 U/L (ref 0–44)
AST: 9 U/L — ABNORMAL LOW (ref 15–41)
Albumin: 4.5 g/dL (ref 3.5–5.0)
Alkaline Phosphatase: 106 U/L (ref 38–126)
Anion gap: 13 (ref 5–15)
BUN: 15 mg/dL (ref 6–20)
CO2: 30 mmol/L (ref 22–32)
Calcium: 10.4 mg/dL — ABNORMAL HIGH (ref 8.9–10.3)
Chloride: 99 mmol/L (ref 98–111)
Creatinine: 1.01 mg/dL (ref 0.61–1.24)
GFR, Est AFR Am: >60 mL/min (ref >60)
GFR, Estimated: >60 mL/min (ref >60)
Glucose, Bld: 107 mg/dL — ABNORMAL HIGH (ref 70–99)
Potassium: 4.8 mmol/L (ref 3.5–5.1)
Sodium: 142 mmol/L (ref 135–145)
Total Bilirubin: 0.4 mg/dL (ref 0.3–1.2)
Total Protein: 7.7 g/dL (ref 6.5–8.1)

## 2019-01-18 LAB — CBC WITH DIFFERENTIAL (CANCER CENTER ONLY)
Abs Immature Granulocytes: 0.07 10*3/uL (ref 0.00–0.07)
Basophils Absolute: 0 10*3/uL (ref 0.0–0.1)
Basophils Relative: 1 %
EOS PCT: 2 %
Eosinophils Absolute: 0.2 10*3/uL (ref 0.0–0.5)
HCT: 33.1 % — ABNORMAL LOW (ref 39.0–52.0)
Hemoglobin: 9.5 g/dL — ABNORMAL LOW (ref 13.0–17.0)
Immature Granulocytes: 1 %
LYMPHS PCT: 10 %
Lymphs Abs: 0.9 10*3/uL (ref 0.7–4.0)
MCH: 22.1 pg — AB (ref 26.0–34.0)
MCHC: 28.7 g/dL — ABNORMAL LOW (ref 30.0–36.0)
MCV: 77.2 fL — ABNORMAL LOW (ref 80.0–100.0)
Monocytes Absolute: 0.7 10*3/uL (ref 0.1–1.0)
Monocytes Relative: 8 %
Neutro Abs: 6.9 10*3/uL (ref 1.7–7.7)
Neutrophils Relative %: 78 %
Platelet Count: 396 10*3/uL (ref 150–400)
RBC: 4.29 MIL/uL (ref 4.22–5.81)
RDW: 17 % — ABNORMAL HIGH (ref 11.5–15.5)
WBC Count: 8.7 10*3/uL (ref 4.0–10.5)
nRBC: 0 % (ref 0.0–0.2)

## 2019-01-18 MED ORDER — SODIUM CHLORIDE 0.9 % IV SOLN
510.0000 mg | Freq: Once | INTRAVENOUS | Status: AC
  Filled 2019-01-18: qty 17

## 2019-01-18 MED ORDER — SODIUM CHLORIDE 0.9 % IV SOLN
Freq: Once | INTRAVENOUS | Status: AC
  Filled 2019-01-18: qty 250

## 2019-01-18 MED ORDER — FAMCICLOVIR 250 MG PO TABS: 250.0000 mg | ORAL_TABLET | Freq: Every day | ORAL | 8 refills | Status: DC

## 2019-01-18 MED ORDER — SODIUM CHLORIDE 0.9 % IV SOLN
Freq: Once | INTRAVENOUS | Status: AC
  Administered 2019-01-18: 13:00:00 via INTRAVENOUS
  Filled 2019-01-18: qty 250

## 2019-01-18 MED ORDER — ALLOPURINOL 100 MG PO TABS: 100.0000 mg | ORAL_TABLET | Freq: Every day | ORAL | 0 refills | Status: DC

## 2019-01-18 MED ORDER — SODIUM CHLORIDE 0.9 % IV SOLN
510.0000 mg | Freq: Once | INTRAVENOUS | Status: AC
  Administered 2019-01-18: 510 mg via INTRAVENOUS
  Filled 2019-01-18: qty 17

## 2019-01-18 NOTE — Progress Notes (Signed)
Hematology and Oncology Follow Up Visit  VALENTE FOSBERG [id number] April 04, 1966 53 y.o. 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 Donaho is back for follow-up.  I am seeing him as Dr.Chao is out of the office today unexpectedly.  Mr. Graveline actually is well-known to my family.  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  He saw some of her pictures in my office.  He knew exactly who she was.  He recently was diagnosed with a diffuse large B-cell lymphoma.  He underwent a excisional node biopsy in the left supraclavicular fossa.  This was done on 01/12/2019.  The pathology report ([id number]) showed diffuse large B-cell lymphoma.  It was CD10, BCL-6, and BCL-2+ on immunohistochemical stain.  The cytogenetics are not yet back.  We did the PET scan.  This was done earlier this week.  The PET scan showed extensive disease within his lymph nodes and also within his bones.  Thankfully, there was no involvement of his lung, heart, liver, kidneys.  There is no splenomegaly.  He has a elevated LDH of 732.  He is quite anemic.  He is taking some oral iron.  I did go ahead and give him some IV iron in the office today.  His iron studies that were done last week showed a ferritin of 511 with an iron saturation of 9%.  I felt that IV iron would be more effective in getting him feeling better and get his hemoglobin higher.  I believe that his IPI score is 3.  1 might consider the IPI score of 4 if you consider that his performance status is close to ECOG 2.  He truly is going to need aggressive therapy.  I suspect that he might be a "double hit" or "triple hit" positive.  I suspect he is going to need CNS prophylactic therapy.  I also suspect that he is going to need a stem cell transplant when he gets into remission.  I just have a feeling that we will have to be aggressive with  him.  He will need to have a Port-A-Cath placed.  We will call his surgeon, Dr. Dalbert Batman, to try to get a Port-A-Cath placed next week.  He is little bit sore in the left shoulder from his biopsy.  He has had no issues with his bowels or bladder.  He has had no cough.  There is no headache.  Overall, his performance status is ECOG 1-  Medications:  Current Outpatient Medications:  .  acetaminophen (TYLENOL) 325 MG tablet, Take 975 mg by mouth every 6 (six) hours as needed for mild pain., Disp: , Rfl:  .  allopurinol (ZYLOPRIM) 100 MG tablet, Take 1 tablet (100 mg total) by mouth daily., Disp: 30 tablet, Rfl: 0 .  famciclovir (FAMVIR) 250 MG tablet, Take 1 tablet (250 mg total) by mouth daily., Disp: 30 tablet, Rfl: 8 .  ferrous sulfate 325 (65 FE) MG EC tablet, Take 325 mg by mouth daily. , Disp: , Rfl:  .  HYDROcodone-acetaminophen (NORCO) 5-325 MG tablet, Take 1-2 tablets by mouth every 4 (four) hours as needed for moderate pain or severe pain., Disp: 30 tablet, Rfl: 0 No current facility-administered medications for this visit.   Facility-Administered Medications Ordered in Other Visits:  .  ferumoxytol (FERAHEME) 510 mg in sodium chloride 0.9 % 100 mL IVPB,  510 mg, Intravenous, Once, , Rudell Cobb, MD  Allergies:  Allergies  Allergen Reactions  . Penicillins Rash    Happened as an infant Has patient had a PCN reaction causing immediate rash, facial/tongue/throat swelling, SOB or lightheadedness with hypotension: Yes Has patient had a PCN reaction causing severe rash involving mucus membranes or skin necrosis: Unknown Has patient had a PCN reaction that required hospitalization: No Has patient had a PCN reaction occurring within the last 10 years: No If all of the above answers are "NO", then may proceed with Cephalosporin use.     Past Medical History, Surgical history, Social history, and Family History were reviewed and updated.  Review of Systems: Review of Systems   Constitutional: Positive for fatigue and unexpected weight change.  HENT:  Negative.   Eyes: Negative.   Respiratory: Positive for shortness of breath.   Cardiovascular: Negative.   Gastrointestinal: Negative.   Endocrine: Negative.   Musculoskeletal: Positive for flank pain, myalgias and neck pain.  Skin: Negative.   Neurological: Negative.   Hematological: Negative.   Psychiatric/Behavioral: Negative.     Physical Exam:  weight is 190 lb (86.2 kg). His oral temperature is 98.2 F (36.8 C). His blood pressure is 152/69 (abnormal) and his pulse is 96. His respiration is 18 and oxygen saturation is 98%.   Wt Readings from Last 3 Encounters:  01/18/19 190 lb (86.2 kg)  01/12/19 212 lb (96.2 kg)  01/09/19 209 lb 6.4 oz (95 kg)    Physical Exam Vitals signs reviewed.  HENT:     Head: Normocephalic and atraumatic.  Eyes:     Pupils: Pupils are equal, round, and reactive to light.  Neck:     Musculoskeletal: Normal range of motion.  Cardiovascular:     Rate and Rhythm: Normal rate and regular rhythm.     Heart sounds: Normal heart sounds.  Pulmonary:     Effort: Pulmonary effort is normal.     Breath sounds: Normal breath sounds.  Abdominal:     General: Bowel sounds are normal.     Palpations: Abdomen is soft.  Musculoskeletal: Normal range of motion.        General: No tenderness or deformity.  Lymphadenopathy:     Cervical: No cervical adenopathy.  Skin:    General: Skin is warm and dry.     Findings: No erythema or rash.  Neurological:     Mental Status: He is alert and oriented to person, place, and time.  Psychiatric:        Behavior: Behavior normal.        Thought Content: Thought content normal.        Judgment: Judgment normal.      Lab Results  Component Value Date   WBC 8.7 01/18/2019   HGB 9.5 (L) 01/18/2019   HCT 33.1 (L) 01/18/2019   MCV 77.2 (L) 01/18/2019   PLT 396 01/18/2019     Chemistry      Component Value Date/Time   NA 142  01/18/2019 1058   K 4.8 01/18/2019 1058   CL 99 01/18/2019 1058   CO2 30 01/18/2019 1058   BUN 15 01/18/2019 1058   CREATININE 1.01 01/18/2019 1058      Component Value Date/Time   CALCIUM 10.4 (H) 01/18/2019 1058   ALKPHOS 106 01/18/2019 1058   AST 9 (L) 01/18/2019 1058   ALT 15 01/18/2019 1058   BILITOT 0.4 01/18/2019 1058       Impression and Plan: Mr. Ziomek  is a 53 year old white male.  He is a Education officer, museum at [employer].  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  He has a diffuse large B-cell lymphoma.  He will need to start therapy next week.  I called pathology.  They had sent off the studies for the double hit and triple hit analysis.  Hopefully this will back next week.  We might want to consider just start him off with treatment with [facility].  He will need to have a echocardiogram done.  I will have to see about getting this set up.  If we do find that he is "double hit" or "triple hit" then we can always switch him to a more aggressive therapy.  I did go ahead and give him allopurinol.  He will start 100 mg p.o. daily.  I also gave him Famvir.  He will start 250 mg p.o. daily.  I think that is important that we try to get his treatment started soon.  He has an incredible amount of bulk with his lymphoma.  I do not think a bone marrow is going to change management on him.  I suspect he will also need CNS prophylaxis at some point.  Again, I think if he gets into remission, then I would favor an upfront transplant with him.  I spent about an hour or so with he and his mom.  I spent all the time face-to-face.  I set him up with his iron.  We set him up with the echocardiogram.  Again, hopefully he will get started with treatment next week.   Volanda Napoleon, MD 2/6/20202:34 PM

## 2019-01-18 NOTE — Patient Instructions (Signed)

## 2019-01-19 ENCOUNTER — Other Ambulatory Visit: Payer: Self-pay | Admitting: General Surgery

## 2019-01-19 ENCOUNTER — Telehealth: Payer: Self-pay | Admitting: *Deleted

## 2019-01-19 ENCOUNTER — Other Ambulatory Visit: Payer: Self-pay | Admitting: Family

## 2019-01-19 DIAGNOSIS — C8330 Diffuse large B-cell lymphoma, unspecified site: Secondary | ICD-10-CM

## 2019-01-19 DIAGNOSIS — M79604 Pain in right leg: Secondary | ICD-10-CM

## 2019-01-19 MED ORDER — TRAMADOL HCL 50 MG PO TABS: 50.0000 mg | ORAL_TABLET | Freq: Three times a day (TID) | ORAL | 0 refills | Status: DC | PRN

## 2019-01-19 NOTE — Telephone Encounter (Signed)
lmovm for pt regarding rx sent to pharmacy

## 2019-01-20 NOTE — H&P (Signed)
Paul Mason Location: [facility] Patient #: [id number] DOB: 06/03/1966 Single / Language: Cleophus Molt / Race: White Male     History of Present Illness      This is a very pleasant 53 year old man. Former patient of mine.  His mother is a Marine scientist. He is referred by Dr. Maylon Peppers and Dr. Marin Olp  at the [facility] for consideration of left cervical lymph node biopsy. This was done and shows diffuse large B-cell lymphoma. Port and chemotherapy is planned urgently.  . Over the past few weeks he is developed some sweats and progressive fatigue and a little bit of fever. He reported some chest tightness which did not improve with anti-inflammatories. His weight has been stable. He was seen in the ED.      CT scan of neck chest abdomen and pelvis and CT angiogram have been done. Left supraclavicular lymph nodes are present and are most prominent, approaching 3 cm. Abnormal soft tissue masses surrounding both kidneys. Retroperitoneal adenopathy. Right internal iliac adenopathy. Mild bilateral hydronephrosis. Small amount of pleural fluid and ascites. Small pulmonary nodules. No pulmonary emboli.       PET scan shows extensive cervical, paratracheal,peri aortic and iliac nodes. Extensive skeletal metastasis, esp. Left scapula and right sacrum. Normal spleen. Recent lab work reveals platelet count 276,000. Hemoglobin 8.3. White count 5900. Liver function tests, creatinine normal. LDH significantly elevated. Iron level significantly depressed. HIV negative. Hepatitis B antibody positive suggesting prior infection     Past history significant for bilateral inguinal hernia repairs. Ganglion cyst. Basically healthy previously.  Family history reveals an uncle had lymphoma 40 years ago and is still alive Social history reveals he is divorced. . Lives in [place]. 2 sons with joint custody. Denies tobacco. Seldom drinks alcohol. 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     Exam reveals healing wound in the lateral left supraclavicular area. No significant peripheral adenopathy elsewhere. No organomegaly    Port placement planned.   Allergies  PenicillAMINE *MISCELLANEOUS THERAPEUTIC CLASSES*  Hives. Allergies Reconciled   Medication History Azithromycin (250MG  Tablet, Oral) Active. No Current Medications  Meloxicam (15MG  Tablet, Oral) Active. Naproxen (500MG  Tablet, Oral) Active. Medications Reconciled  Vitals  Weight: 211.38 lb Height: 69in Body Surface Area: 2.11 m Body Mass Index: 31.21 kg/m  Temp.: 97.48F(Temporal)  Pulse: 98 (Regular)  P.OX: 89% (Room air) BP: 154/96 (Sitting, Right Arm, Standard)       Physical Exam  General Mental Status-Alert. General Appearance-Consistent with stated age. Hydration-Well hydrated. Voice-Normal. Note: Very pleasant. Mother is with him. Pallor is obvious.   Head and Neck Head -Note: 3 cm nontender fullness and left supraclavicular area. This is fairly lateral. No other large lymph nodes in these right supraclavicular, cervical, or suboccipital area.  Trachea-midline. Thyroid Gland Characteristics - normal size and consistency.  Eye Eyeball - Bilateral-Extraocular movements intact. Sclera/Conjunctiva - Bilateral-No scleral icterus.  Chest and Lung Exam Chest and lung exam reveals -quiet, even and easy respiratory effort with no use of accessory muscles and on auscultation, normal breath sounds, no adventitious sounds and normal vocal resonance. Inspection Chest Wall - Normal. Back - normal.  Cardiovascular Cardiovascular examination reveals -normal heart sounds, regular rate and rhythm with no murmurs and normal pedal pulses bilaterally.  Abdomen Inspection Inspection of the abdomen reveals - No Hernias. Skin - Scar - no surgical scars. Palpation/Percussion Palpation and Percussion of the abdomen reveal - Soft, Non  Tender, No Rebound tenderness, No Rigidity (guarding) and No  hepatosplenomegaly. Auscultation Auscultation of the abdomen reveals - Bowel sounds normal. Note: Subjectively palpation of the abdomen gives him the sensation of fullness but don't feel any mass or hernia. No organomegaly   Neurologic Neurologic evaluation reveals -alert and oriented x 3 with no impairment of recent or remote memory. Mental Status-Normal.  Musculoskeletal Normal Exam - Left-Upper Extremity Strength Normal and Lower Extremity Strength Normal. Normal Exam - Right-Upper Extremity Strength Normal and Lower Extremity Strength Normal.  Lymphatic Note: No significant adenopathy in either axilla or in either inguinal area     Assessment & Plan  LYMPHADENOPATHY, CERVICAL (R59.0)  The left neck biopsy shows diffuse large B-cell lymphoma. Dr. Marin Olp requests a port be placed now. We have discussed the procedure and its risks. Port insertion with ultrasound is scheduled  ANEMIA, DEFICIENCY (D53.9)   [name]. Dalbert Batman, M.D., [facility] General and Minimally invasive Surgery Breast and Colorectal Surgery Office:   [phone] Pager:   [phone]

## 2019-01-22 ENCOUNTER — Encounter (HOSPITAL_BASED_OUTPATIENT_CLINIC_OR_DEPARTMENT_OTHER): Payer: Self-pay | Admitting: *Deleted

## 2019-01-22 ENCOUNTER — Other Ambulatory Visit: Payer: Self-pay

## 2019-01-22 NOTE — Progress Notes (Signed)
Pt's wife given Ensure and instructed to tell him to drink by 0730 day of surgery with teach back method.

## 2019-01-23 ENCOUNTER — Encounter (HOSPITAL_BASED_OUTPATIENT_CLINIC_OR_DEPARTMENT_OTHER): Payer: Self-pay | Admitting: *Deleted

## 2019-01-23 ENCOUNTER — Ambulatory Visit (HOSPITAL_COMMUNITY): Payer: BC Managed Care – PPO

## 2019-01-23 ENCOUNTER — Other Ambulatory Visit: Payer: Self-pay

## 2019-01-23 ENCOUNTER — Ambulatory Visit (HOSPITAL_BASED_OUTPATIENT_CLINIC_OR_DEPARTMENT_OTHER): Payer: BC Managed Care – PPO | Admitting: Certified Registered"

## 2019-01-23 ENCOUNTER — Ambulatory Visit (HOSPITAL_BASED_OUTPATIENT_CLINIC_OR_DEPARTMENT_OTHER)
Admission: RE | Admit: 2019-01-23 | Discharge: 2019-01-23 | Disposition: A | Discharge status: Home / Self Care | Payer: BC Managed Care – PPO | Attending: General Surgery | Admitting: General Surgery

## 2019-01-23 ENCOUNTER — Encounter (HOSPITAL_BASED_OUTPATIENT_CLINIC_OR_DEPARTMENT_OTHER)
Admission: RE | Disposition: A | Discharge status: Home / Self Care | Payer: Self-pay | Source: Home / Self Care | Attending: General Surgery

## 2019-01-23 DIAGNOSIS — Z880 Allergy status to penicillin: Secondary | ICD-10-CM | POA: Insufficient documentation

## 2019-01-23 DIAGNOSIS — C7951 Secondary malignant neoplasm of bone: Secondary | ICD-10-CM | POA: Insufficient documentation

## 2019-01-23 DIAGNOSIS — Z419 Encounter for procedure for purposes other than remedying health state, unspecified: Secondary | ICD-10-CM

## 2019-01-23 DIAGNOSIS — N1330 Unspecified hydronephrosis: Secondary | ICD-10-CM | POA: Diagnosis not present

## 2019-01-23 DIAGNOSIS — R0789 Other chest pain: Secondary | ICD-10-CM | POA: Diagnosis not present

## 2019-01-23 DIAGNOSIS — Z791 Long term (current) use of non-steroidal anti-inflammatories (NSAID): Secondary | ICD-10-CM | POA: Diagnosis not present

## 2019-01-23 DIAGNOSIS — R188 Other ascites: Secondary | ICD-10-CM | POA: Diagnosis not present

## 2019-01-23 DIAGNOSIS — C8330 Diffuse large B-cell lymphoma, unspecified site: Secondary | ICD-10-CM | POA: Diagnosis present

## 2019-01-23 DIAGNOSIS — Z79899 Other long term (current) drug therapy: Secondary | ICD-10-CM | POA: Insufficient documentation

## 2019-01-23 DIAGNOSIS — C8331 Diffuse large B-cell lymphoma, lymph nodes of head, face, and neck: Secondary | ICD-10-CM

## 2019-01-23 DIAGNOSIS — D649 Anemia, unspecified: Secondary | ICD-10-CM | POA: Insufficient documentation

## 2019-01-23 DIAGNOSIS — Z95828 Presence of other vascular implants and grafts: Secondary | ICD-10-CM

## 2019-01-23 DIAGNOSIS — C8338 Diffuse large B-cell lymphoma, lymph nodes of multiple sites: Secondary | ICD-10-CM | POA: Diagnosis present

## 2019-01-23 HISTORY — PX: PORTACATH PLACEMENT: SHX2246

## 2019-01-23 HISTORY — DX: Malignant (primary) neoplasm, unspecified: C80.1

## 2019-01-23 SURGERY — INSERTION, TUNNELED CENTRAL VENOUS DEVICE, WITH PORT
Anesthesia: General

## 2019-01-23 MED ORDER — GABAPENTIN 300 MG PO CAPS
300.0000 mg | ORAL_CAPSULE | ORAL | Status: AC
  Administered 2019-01-23: 300 mg via ORAL

## 2019-01-23 MED ORDER — HYDROMORPHONE HCL 1 MG/ML IJ SOLN: 0.2500 mg | INTRAMUSCULAR | Status: DC | PRN

## 2019-01-23 MED ORDER — OXYCODONE HCL 5 MG PO TABS: 5.0000 mg | ORAL_TABLET | Freq: Once | ORAL | Status: DC | PRN

## 2019-01-23 MED ORDER — ACETAMINOPHEN 500 MG PO TABS: 1000.0000 mg | ORAL_TABLET | ORAL | Status: DC

## 2019-01-23 MED ORDER — HYDROCODONE-ACETAMINOPHEN 5-325 MG PO TABS: 1.0000 | ORAL_TABLET | Freq: Four times a day (QID) | ORAL | 0 refills | Status: DC | PRN

## 2019-01-23 MED ORDER — DEXAMETHASONE SODIUM PHOSPHATE 10 MG/ML IJ SOLN
INTRAMUSCULAR | Status: AC
  Filled 2019-01-23: qty 1

## 2019-01-23 MED ORDER — PROPOFOL 10 MG/ML IV BOLUS
INTRAVENOUS | Status: DC | PRN
  Administered 2019-01-23: 200 mg via INTRAVENOUS

## 2019-01-23 MED ORDER — MEPERIDINE HCL 25 MG/ML IJ SOLN: 6.2500 mg | INTRAMUSCULAR | Status: DC | PRN

## 2019-01-23 MED ORDER — FENTANYL CITRATE (PF) 100 MCG/2ML IJ SOLN
INTRAMUSCULAR | Status: DC | PRN
  Administered 2019-01-23 (×2): 50 ug via INTRAVENOUS

## 2019-01-23 MED ORDER — ONDANSETRON HCL 4 MG/2ML IJ SOLN
INTRAMUSCULAR | Status: DC | PRN
  Administered 2019-01-23: 4 mg via INTRAVENOUS

## 2019-01-23 MED ORDER — SCOPOLAMINE 1 MG/3DAYS TD PT72: 1.0000 | MEDICATED_PATCH | Freq: Once | TRANSDERMAL | Status: DC | PRN

## 2019-01-23 MED ORDER — MIDAZOLAM HCL 2 MG/2ML IJ SOLN: 1.0000 mg | INTRAMUSCULAR | Status: DC | PRN

## 2019-01-23 MED ORDER — BUPIVACAINE-EPINEPHRINE (PF) 0.5% -1:200000 IJ SOLN: INTRAMUSCULAR | Status: DC | PRN

## 2019-01-23 MED ORDER — VANCOMYCIN HCL IN DEXTROSE 1-5 GM/200ML-% IV SOLN
1000.0000 mg | Freq: Once | INTRAVENOUS | Status: AC
  Administered 2019-01-23: 1000 mg via INTRAVENOUS

## 2019-01-23 MED ORDER — GABAPENTIN 300 MG PO CAPS
ORAL_CAPSULE | ORAL | Status: AC
  Filled 2019-01-23: qty 1

## 2019-01-23 MED ORDER — HEPARIN SOD (PORK) LOCK FLUSH 100 UNIT/ML IV SOLN
INTRAVENOUS | Status: DC | PRN
  Administered 2019-01-23: 5 [IU] via INTRAVENOUS

## 2019-01-23 MED ORDER — MIDAZOLAM HCL 5 MG/5ML IJ SOLN
INTRAMUSCULAR | Status: DC | PRN
  Administered 2019-01-23: 2 mg via INTRAVENOUS

## 2019-01-23 MED ORDER — CEFAZOLIN SODIUM-DEXTROSE 2-4 GM/100ML-% IV SOLN: 2.0000 g | INTRAVENOUS | Status: DC

## 2019-01-23 MED ORDER — MIDAZOLAM HCL 2 MG/2ML IJ SOLN
INTRAMUSCULAR | Status: AC
  Filled 2019-01-23: qty 2

## 2019-01-23 MED ORDER — PROPOFOL 10 MG/ML IV BOLUS
INTRAVENOUS | Status: AC
  Filled 2019-01-23: qty 20

## 2019-01-23 MED ORDER — LIDOCAINE 2% (20 MG/ML) 5 ML SYRINGE
INTRAMUSCULAR | Status: AC
  Filled 2019-01-23: qty 10

## 2019-01-23 MED ORDER — CHLORHEXIDINE GLUCONATE CLOTH 2 % EX PADS: 6.0000 | MEDICATED_PAD | Freq: Once | CUTANEOUS | Status: DC

## 2019-01-23 MED ORDER — HEPARIN (PORCINE) IN NACL 2-0.9 UNITS/ML
INTRAMUSCULAR | Status: AC | PRN
  Administered 2019-01-23: 1 via INTRAVENOUS

## 2019-01-23 MED ORDER — OXYCODONE HCL 5 MG/5ML PO SOLN: 5.0000 mg | Freq: Once | ORAL | Status: DC | PRN

## 2019-01-23 MED ORDER — BUPIVACAINE HCL (PF) 0.5 % IJ SOLN
INTRAMUSCULAR | Status: DC | PRN
  Administered 2019-01-23: 10 mL

## 2019-01-23 MED ORDER — HEPARIN SOD (PORK) LOCK FLUSH 100 UNIT/ML IV SOLN
INTRAVENOUS | Status: AC
  Filled 2019-01-23: qty 5

## 2019-01-23 MED ORDER — HEPARIN (PORCINE) IN NACL 1000-0.9 UT/500ML-% IV SOLN
INTRAVENOUS | Status: AC
  Filled 2019-01-23: qty 500

## 2019-01-23 MED ORDER — BUPIVACAINE HCL (PF) 0.5 % IJ SOLN
INTRAMUSCULAR | Status: AC
  Filled 2019-01-23: qty 30

## 2019-01-23 MED ORDER — DEXAMETHASONE SODIUM PHOSPHATE 10 MG/ML IJ SOLN
INTRAMUSCULAR | Status: DC | PRN
  Administered 2019-01-23: 5 mg via INTRAVENOUS

## 2019-01-23 MED ORDER — FENTANYL CITRATE (PF) 100 MCG/2ML IJ SOLN: 50.0000 ug | INTRAMUSCULAR | Status: DC | PRN

## 2019-01-23 MED ORDER — VANCOMYCIN HCL IN DEXTROSE 1-5 GM/200ML-% IV SOLN
INTRAVENOUS | Status: AC
  Filled 2019-01-23: qty 200

## 2019-01-23 MED ORDER — CEFAZOLIN SODIUM-DEXTROSE 2-4 GM/100ML-% IV SOLN
INTRAVENOUS | Status: AC
  Filled 2019-01-23: qty 100

## 2019-01-23 MED ORDER — FENTANYL CITRATE (PF) 100 MCG/2ML IJ SOLN
INTRAMUSCULAR | Status: AC
  Filled 2019-01-23: qty 2

## 2019-01-23 MED ORDER — ONDANSETRON HCL 4 MG/2ML IJ SOLN
INTRAMUSCULAR | Status: AC
  Filled 2019-01-23: qty 2

## 2019-01-23 MED ORDER — LACTATED RINGERS IV SOLN
INTRAVENOUS | Status: DC
  Administered 2019-01-23 (×2): via INTRAVENOUS

## 2019-01-23 MED ORDER — PROMETHAZINE HCL 25 MG/ML IJ SOLN: 6.2500 mg | INTRAMUSCULAR | Status: DC | PRN

## 2019-01-23 MED ORDER — LIDOCAINE 2% (20 MG/ML) 5 ML SYRINGE
INTRAMUSCULAR | Status: DC | PRN
  Administered 2019-01-23: 100 mg via INTRAVENOUS

## 2019-01-23 SURGICAL SUPPLY — 51 items
BAG DECANTER FOR FLEXI CONT (MISCELLANEOUS) ×3 IMPLANT
BENZOIN TINCTURE PRP APPL 2/3 (GAUZE/BANDAGES/DRESSINGS) IMPLANT
BLADE HEX COATED 2.75 (ELECTRODE) ×3 IMPLANT
BLADE SURG 15 STRL LF DISP TIS (BLADE) ×1 IMPLANT
BLADE SURG 15 STRL SS (BLADE) ×2
CANISTER SUCT 1200ML W/VALVE (MISCELLANEOUS) IMPLANT
CHLORAPREP W/TINT 26ML (MISCELLANEOUS) ×3 IMPLANT
CLOSURE WOUND 1/2 X4 (GAUZE/BANDAGES/DRESSINGS)
COVER BACK TABLE 60X90IN (DRAPES) ×3 IMPLANT
COVER MAYO STAND STRL (DRAPES) ×3 IMPLANT
COVER PROBE 5X48 (MISCELLANEOUS) ×2
COVER WAND RF STERILE (DRAPES) IMPLANT
DECANTER SPIKE VIAL GLASS SM (MISCELLANEOUS) IMPLANT
DERMABOND ADVANCED (GAUZE/BANDAGES/DRESSINGS) ×2
DERMABOND ADVANCED .7 DNX12 (GAUZE/BANDAGES/DRESSINGS) ×1 IMPLANT
DRAPE C-ARM 42X72 X-RAY (DRAPES) ×3 IMPLANT
DRAPE LAPAROSCOPIC ABDOMINAL (DRAPES) ×3 IMPLANT
DRAPE UTILITY XL STRL (DRAPES) ×3 IMPLANT
DRSG TEGADERM 2-3/8X2-3/4 SM (GAUZE/BANDAGES/DRESSINGS) IMPLANT
DRSG TEGADERM 4X10 (GAUZE/BANDAGES/DRESSINGS) IMPLANT
DRSG TEGADERM 4X4.75 (GAUZE/BANDAGES/DRESSINGS) IMPLANT
ELECT REM PT RETURN 9FT ADLT (ELECTROSURGICAL) ×3
ELECTRODE REM PT RTRN 9FT ADLT (ELECTROSURGICAL) ×1 IMPLANT
GAUZE SPONGE 4X4 12PLY STRL LF (GAUZE/BANDAGES/DRESSINGS) IMPLANT
GLOVE EUDERMIC 7 POWDERFREE (GLOVE) ×3 IMPLANT
GOWN STRL REUS W/ TWL LRG LVL3 (GOWN DISPOSABLE) ×1 IMPLANT
GOWN STRL REUS W/ TWL XL LVL3 (GOWN DISPOSABLE) ×1 IMPLANT
GOWN STRL REUS W/TWL LRG LVL3 (GOWN DISPOSABLE) ×2
GOWN STRL REUS W/TWL XL LVL3 (GOWN DISPOSABLE) ×2
IV CATH PLACEMENT UNIT 16 GA (IV SOLUTION) IMPLANT
IV KIT MINILOC 20X1 SAFETY (NEEDLE) IMPLANT
KIT CVR 48X5XPRB PLUP LF (MISCELLANEOUS) ×1 IMPLANT
KIT PORT POWER 8FR ISP CVUE (Port) ×3 IMPLANT
NEEDLE BLUNT 17GA (NEEDLE) IMPLANT
NEEDLE HYPO 22GX1.5 SAFETY (NEEDLE) ×3 IMPLANT
NEEDLE HYPO 25X1 1.5 SAFETY (NEEDLE) ×3 IMPLANT
PACK BASIN DAY SURGERY FS (CUSTOM PROCEDURE TRAY) ×3 IMPLANT
PENCIL BUTTON HOLSTER BLD 10FT (ELECTRODE) ×3 IMPLANT
SET SHEATH INTRODUCER 10FR (MISCELLANEOUS) IMPLANT
SHEATH COOK PEEL AWAY SET 9F (SHEATH) IMPLANT
SLEEVE SCD COMPRESS KNEE MED (MISCELLANEOUS) ×3 IMPLANT
STRIP CLOSURE SKIN 1/2X4 (GAUZE/BANDAGES/DRESSINGS) IMPLANT
SUT MNCRL AB 4-0 PS2 18 (SUTURE) ×3 IMPLANT
SUT PROLENE 2 0 CT2 30 (SUTURE) ×3 IMPLANT
SUT VICRYL 3-0 CR8 SH (SUTURE) ×3 IMPLANT
SYR 10ML LL (SYRINGE) ×3 IMPLANT
SYR 5ML LUER SLIP (SYRINGE) ×3 IMPLANT
TOWEL GREEN STERILE FF (TOWEL DISPOSABLE) ×6 IMPLANT
TUBE CONNECTING 20'X1/4 (TUBING)
TUBE CONNECTING 20X1/4 (TUBING) IMPLANT
YANKAUER SUCT BULB TIP NO VENT (SUCTIONS) IMPLANT

## 2019-01-23 NOTE — Interval H&P Note (Signed)
History and Physical Interval Note:  01/23/2019 11:01 AM  Paul Mason  has presented today for surgery, with the diagnosis of LYMPHOMA  The various methods of treatment have been discussed with the patient and family. After consideration of risks, benefits and other options for treatment, the patient has consented to  Procedure(s): INSERTION PORT-A-CATH WITH  ULTRASOUND (N/A) as a surgical intervention .  The patient's history has been reviewed, patient examined, no change in status, stable for surgery.  I have reviewed the patient's chart and labs.  Questions were answered to the patient's satisfaction.     [name]

## 2019-01-23 NOTE — Op Note (Signed)
Patient Name:           Paul Mason   Date of Surgery:        01/23/2019  Pre op Diagnosis:      Diffuse large B-cell lymphoma  Post op Diagnosis:    Same  Procedure:                 Insertion PowerPort [place] 8 French tunneled venous vascular access device                                      Use of fluoroscopy for guidance and positioning  Surgeon:                     [name]. Dalbert Batman, M.D., FACS  Assistant:                      Or staff   Operative indications: This is a 53 year old man who  is referred by Dr. Maylon Peppers and Dr. Marin Olp  at the [facility].  Recent left cervical lymph node biopsy shows diffuse large B-cell lymphoma. Port and chemotherapy is planned urgently.      CT scan of neck chest abdomen and pelvis and CT angiogram have been done. Left supraclavicular lymph nodes are present and are most prominent, approaching 3 cm. Abnormal soft tissue masses surrounding both kidneys. Retroperitoneal adenopathy. Right internal iliac adenopathy. Mild bilateral hydronephrosis. Small amount of pleural fluid and ascites. Small pulmonary nodules. No pulmonary emboli.       PET scan shows extensive cervical, paratracheal,peri aortic and iliac nodes. Extensive skeletal metastasis, esp. Left scapula and right sacrum. Normal spleen. Recent lab work reveals platelet count 276,000. Hemoglobin 8.3. White count 5900. Liver function tests, creatinine normal. LDH significantly elevated. Iron level significantly depressed. HIV negative. Hepatitis B antibody positive suggesting prior infection     Exam reveals healing wound in the lateral left supraclavicular area. No significant peripheral adenopathy elsewhere.    Port placement planned.  Operative Findings:       The port was placed through the right subclavian vein.  Insertion was uneventful.  The catheter was functioning normally at the completion of the case   Procedure in Detail:          Following the induction  of general LMA anesthesia the patient was positioned with a roll behind his shoulders and his arms tucked at his sides.  The neck and chest were prepped and draped in a sterile fashion.  Intravenous antibiotics were given.  Surgical timeout was performed.  0.5% Marcaine was used as a local infiltration anesthetic.     A right subclavian venipuncture was performed with a single pass and a guidewire inserted into the superior vena cava under fluoroscopic guidance.  Using the C arm I marked a template on the chest wall to mark catheter length and catheter positioning in the SVC just above the right atrium.  A small incision was made at the wire insertion site.  A transverse incision was made below the midpoint of the clavicle and a subcutaneous pocket created.  Using a tunneling device I passed the catheter from the wire insertion site to the port pocket site.  Using the template drawn the chest wall I measured the catheter length and cut it 22 cm in length.  The catheter was secured to the port with the  locking device and the port and catheter flushed with concentrated heparin.  The port was sutured to the pectoralis fascia with 3 interrupted sutures of 2-0 Prolene.  The dilator and peel-away sheath assembly were inserted over the guidewire into the central venous circulation.  The guidewire and dilator were removed.  The catheter was threaded and the dilator peel-away sheath removed.  The catheter flushed easily and had excellent blood return.  C arm fluoroscopy confirmed that the catheter tip was in the superior vena cava near the right atrium and there was no deformity of the catheter anywhere along its course.  The port and catheter were flushed with concentrated heparin.      The wounds were hemostatic.  The subcutaneous tissue was closed with 3-0 Vicryl sutures and the skin closed with running subcuticular 4-0 Monocryl and Dermabond.  The patient tolerated the procedure well and was taken to PACU where a chest  x-ray is planned.  EBL 10 cc.  Counts correct.  Complications none.    Addendum: I logged onto the PMP aware website and reviewed his prescription medication history     Paul Mason M. Dalbert Batman, M.D., FACS General and Minimally Invasive Surgery Breast and Colorectal Surgery  01/23/2019 12:59 PM

## 2019-01-23 NOTE — Anesthesia Postprocedure Evaluation (Signed)
Anesthesia Post Note  Patient: Paul Mason  Procedure(s) Performed: INSERTION PORT-A-CATH WITH  ULTRASOUND (N/A )     Patient location during evaluation: PACU Anesthesia Type: General Level of consciousness: sedated Pain management: pain level controlled Vital Signs Assessment: post-procedure vital signs reviewed and stable Respiratory status: spontaneous breathing and respiratory function stable Cardiovascular status: stable Postop Assessment: no apparent nausea or vomiting Anesthetic complications: no    Last Vitals:  Vitals:   01/23/19 1345 01/23/19 1400  BP: 140/88 137/88  Pulse: 95 88  Resp: (!) 27 20  Temp:  37.3 C  SpO2: 97% 97%    Last Pain:  Vitals:   01/23/19 1400  TempSrc:   PainSc: 0-No pain                 Ivar Domangue DANIEL

## 2019-01-23 NOTE — Anesthesia Procedure Notes (Signed)
Procedure Name: LMA Insertion Date/Time: 01/23/2019 12:11 PM Performed by: Gwyndolyn Saxon, CRNA Pre-anesthesia Checklist: Patient identified, Emergency Drugs available, Suction available and Patient being monitored Patient Re-evaluated:Patient Re-evaluated prior to induction Oxygen Delivery Method: Circle system utilized Preoxygenation: Pre-oxygenation with 100% oxygen Induction Type: IV induction Ventilation: Mask ventilation without difficulty and Oral airway inserted - appropriate to patient size LMA: LMA inserted LMA Size: 4.0 Number of attempts: 1 Placement Confirmation: positive ETCO2 and breath sounds checked- equal and bilateral Tube secured with: Tape Dental Injury: Teeth and Oropharynx as per pre-operative assessment

## 2019-01-23 NOTE — Discharge Instructions (Signed)
° ° °PORT-A-CATH: POST OP INSTRUCTIONS ° °Always review your discharge instruction sheet given to you by the facility where your surgery was performed.  ° °1. A prescription for pain medication may be given to you upon discharge. Take your pain medication as prescribed, if needed. If narcotic pain medicine is not needed, then you make take acetaminophen (Tylenol) or ibuprofen (Advil) as needed.  °2. Take your usually prescribed medications unless otherwise directed. °3. If you need a refill on your pain medication, please contact our office. All narcotic pain medicine now requires a paper prescription.  Phoned in and fax refills are no longer allowed by law.  Prescriptions will not be filled after 5 pm or on weekends.  °4. You should follow a light diet for the remainder of the day after your procedure. °5. Most patients will experience some mild swelling and/or bruising in the area of the incision. It may take several days to resolve. °6. It is common to experience some constipation if taking pain medication after surgery. Increasing fluid intake and taking a stool softener (such as Colace) will usually help or prevent this problem from occurring. A mild laxative (Milk of Magnesia or Miralax) should be taken according to package directions if there are no bowel movements after 48 hours.  °7. Unless discharge instructions indicate otherwise, you may remove your bandages 48 hours after surgery, and you may shower at that time. You may have steri-strips (small white skin tapes) in place directly over the incision.  These strips should be left on the skin for 7-10 days.  If your surgeon used Dermabond (skin glue) on the incision, you may shower in 24 hours.  The glue will flake off over the next 2-3 weeks.  °8. If your port is left accessed at the end of surgery (needle left in port), the dressing cannot get wet and should only by changed by a healthcare professional. When the port is no longer accessed (when the  needle has been removed), follow step 7.   °9. ACTIVITIES:  Limit activity involving your arms for the next 72 hours. Do no strenuous exercise or activity for 1 week. You may drive when you are no longer taking prescription pain medication, you can comfortably wear a seatbelt, and you can maneuver your car. °10.You may need to see your doctor in the office for a follow-up appointment.  Please °      check with your doctor.  °11.When you receive a new Port-a-Cath, you will get a product guide and  °      ID card.  Please keep them in case you need them. ° °WHEN TO CALL YOUR DOCTOR ([phone]): °1. Fever over 101.0 °2. Chills °3. Continued bleeding from incision °4. Increased redness and tenderness at the site °5. Shortness of breath, difficulty breathing ° ° °The clinic staff is available to answer your questions during regular business hours. Please don’t hesitate to call and ask to speak to one of the nurses or medical assistants for clinical concerns. If you have a medical emergency, go to the nearest emergency room or call 911.  A surgeon from [facility] is always on call at the hospital.  ° ° ° °For further information, please visit [email or web address] ° ° ° ° ° ° ° ° ° ° °••••••••• ° ° °Managing Your Pain After Surgery Without Opioids ° ° ° °Thank you for participating in our program to help patients manage their pain after surgery without opioids. This is part   of our effort to provide you with the best care possible, without exposing you or your family to the risk that opioids pose. ° °What pain can I expect after surgery? °You can expect to have some pain after surgery. This is normal. The pain is typically worse the day after surgery, and quickly begins to get better. °Many studies have found that many patients are able to manage their pain after surgery with Over-the-Counter (OTC) medications such as Tylenol and Motrin. If you have a condition that does not allow you to take  Tylenol or Motrin, notify your surgical team. ° °How will I manage my pain? °The best strategy for controlling your pain after surgery is around the clock pain control with Tylenol (acetaminophen) and Motrin (ibuprofen or Advil). Alternating these medications with each other allows you to maximize your pain control. In addition to Tylenol and Motrin, you can use heating pads or ice packs on your incisions to help reduce your pain. ° °How will I alternate your regular strength over-the-counter pain medication? °You will take a dose of pain medication every three hours. °; Start by taking 650 mg of Tylenol (2 pills of 325 mg) °; 3 hours later take 600 mg of Motrin (3 pills of 200 mg) °; 3 hours after taking the Motrin take 650 mg of Tylenol °; 3 hours after that take 600 mg of Motrin. ° ° °- 1 - ° °See example - if your first dose of Tylenol is at 12:00 PM ° ° °12:00 PM Tylenol 650 mg (2 pills of 325 mg)  °3:00 PM Motrin 600 mg (3 pills of 200 mg)  °6:00 PM Tylenol 650 mg (2 pills of 325 mg)  °9:00 PM Motrin 600 mg (3 pills of 200 mg)  °Continue alternating every 3 hours  ° °We recommend that you follow this schedule around-the-clock for at least 3 days after surgery, or until you feel that it is no longer needed. Use the table on the last page of this handout to keep track of the medications you are taking. °Important: °Do not take more than 3000mg of Tylenol or 3200mg of Motrin in a 24-hour period. °Do not take ibuprofen/Motrin if you have a history of bleeding stomach ulcers, severe kidney disease, &/or actively taking a blood thinner ° °What if I still have pain? °If you have pain that is not controlled with the over-the-counter pain medications (Tylenol and Motrin or Advil) you might have what we call “breakthrough” pain. You will receive a prescription for a small amount of an opioid pain medication such as Oxycodone, Tramadol, or Tylenol with Codeine. Use these opioid pills in the first 24 hours after surgery  if you have breakthrough pain. Do not take more than 1 pill every 4-6 hours. ° °If you still have uncontrolled pain after using all opioid pills, don't hesitate to call our staff using the number provided. We will help make sure you are managing your pain in the best way possible, and if necessary, we can provide a prescription for additional pain medication. ° ° °Day 1   ° °Time  °Name of Medication Number of pills taken  °Amount of Acetaminophen  °Pain Level  ° °Comments  °AM PM       °AM PM       °AM PM       °AM PM       °AM PM       °AM PM       °AM   PM       °AM PM       °Total Daily amount of Acetaminophen °Do not take more than  3,000 mg per day    ° ° °Day 2   ° °Time  °Name of Medication Number of pills °taken  °Amount of Acetaminophen  °Pain Level  ° °Comments  °AM PM       °AM PM       °AM PM       °AM PM       °AM PM       °AM PM       °AM PM       °AM PM       °Total Daily amount of Acetaminophen °Do not take more than  3,000 mg per day    ° ° °Day 3   ° °Time  °Name of Medication Number of pills taken  °Amount of Acetaminophen  °Pain Level  ° °Comments  °AM PM       °AM PM       °AM PM       °AM PM       ° ° ° °AM PM       °AM PM       °AM PM       °AM PM       °Total Daily amount of Acetaminophen °Do not take more than  3,000 mg per day    ° ° °Day 4   ° °Time  °Name of Medication Number of pills taken  °Amount of Acetaminophen  °Pain Level  ° °Comments  °AM PM       °AM PM       °AM PM       °AM PM       °AM PM       °AM PM       °AM PM       °AM PM       °Total Daily amount of Acetaminophen °Do not take more than  3,000 mg per day    ° ° °Day 5   ° °Time  °Name of Medication Number °of pills taken  °Amount of Acetaminophen  °Pain Level  ° °Comments  °AM PM       °AM PM       °AM PM       °AM PM       °AM PM       °AM PM       °AM PM       °AM PM       °Total Daily amount of Acetaminophen °Do not take more than  3,000 mg per day    ° ° ° °Day 6   ° °Time  °Name of Medication Number of pills °taken    °Amount of Acetaminophen  °Pain Level  °Comments  °AM PM       °AM PM       °AM PM       °AM PM       °AM PM       °AM PM       °AM PM       °AM PM       °Total Daily amount of Acetaminophen °Do not take more than  3,000 mg per day    ° ° °Day 7   ° °Time  °Name of Medication Number of pills taken  °  Amount of Acetaminophen  °Pain Level  ° °Comments  °AM PM       °AM PM       °AM PM       °AM PM       °AM PM       °AM PM       °AM PM       °AM PM       °Total Daily amount of Acetaminophen °Do not take more than  3,000 mg per day    ° ° ° ° °For additional information about how and where to safely dispose of unused opioid °medications - https://www.morepowerful[employer].org ° °Disclaimer: This document contains information and/or instructional materials adapted from [facility] for the typical patient with your condition. It does not replace medical advice from your health care provider because your experience may differ from that of the °typical patient. Talk to your health care provider if you have any questions about this °document, your condition or your treatment plan. °Adapted from [facility] ° °Post Anesthesia Home Care Instructions ° °Activity: °Get plenty of rest for the remainder of the day. A responsible individual must stay with you for 24 hours following the procedure.  °For the next 24 hours, DO NOT: °-Drive a car °-Operate machinery °-Drink alcoholic beverages °-Take any medication unless instructed by your physician °-Make any legal decisions or sign important papers. ° °Meals: °Start with liquid foods such as gelatin or soup. Progress to regular foods as tolerated. Avoid greasy, spicy, heavy foods. If nausea and/or vomiting occur, drink only clear liquids until the nausea and/or vomiting subsides. Call your physician if vomiting continues. ° °Special Instructions/Symptoms: °Your throat may feel dry or sore from the anesthesia or the breathing tube placed in your throat during surgery. If this  causes discomfort, gargle with warm salt water. The discomfort should disappear within 24 hours. ° °If you had a scopolamine patch placed behind your ear for the management of post- operative nausea and/or vomiting: ° °1. The medication in the patch is effective for 72 hours, after which it should be removed.  Wrap patch in a tissue and discard in the trash. Wash hands thoroughly with soap and water. °2. You may remove the patch earlier than 72 hours if you experience unpleasant side effects which may include dry mouth, dizziness or visual disturbances. °3. Avoid touching the patch. Wash your hands with soap and water after contact with the patch. °   ° °

## 2019-01-23 NOTE — Anesthesia Preprocedure Evaluation (Signed)
Anesthesia Evaluation  Patient identified by MRN, date of birth, ID band Patient awake    Reviewed: Allergy & Precautions, NPO status , Patient's Chart, lab work & pertinent test results  History of Anesthesia Complications Negative for: history of anesthetic complications  Airway Mallampati: II  TM Distance: >3 FB Neck ROM: Full    Dental  (+) Teeth Intact, Dental Advisory Given   Pulmonary neg pulmonary ROS,    Pulmonary exam normal breath sounds clear to auscultation       Cardiovascular negative cardio ROS Normal cardiovascular exam Rhythm:Regular Rate:Normal     Neuro/Psych negative neurological ROS     GI/Hepatic negative GI ROS, Neg liver ROS, neg GERD  ,  Endo/Other  negative endocrine ROS  Renal/GU negative Renal ROS     Musculoskeletal Left supraclavicular mass   Abdominal   Peds  Hematology  (+) anemia , Hgb 7.7, lymphadenopathy concerning for malignancy   Anesthesia Other Findings Lymphoma  Reproductive/Obstetrics                             Anesthesia Physical  Anesthesia Plan  ASA: III  Anesthesia Plan: General   Post-op Pain Management:    Induction: Intravenous  PONV Risk Score and Plan: 2 and Treatment may vary due to age or medical condition, Ondansetron, Dexamethasone and Midazolam  Airway Management Planned: Oral ETT  Additional Equipment:   Intra-op Plan:   Post-operative Plan: Extubation in OR  Informed Consent: I have reviewed the patients History and Physical, chart, labs and discussed the procedure including the risks, benefits and alternatives for the proposed anesthesia with the patient or authorized representative who has indicated his/her understanding and acceptance.     Dental advisory given  Plan Discussed with: CRNA  Anesthesia Plan Comments: (See PAT note by Karoline Caldwell, PA-C )        Anesthesia Quick Evaluation

## 2019-01-23 NOTE — Transfer of Care (Signed)
Immediate Anesthesia Transfer of Care Note  Patient: Paul Mason  Procedure(s) Performed: INSERTION PORT-A-CATH WITH  ULTRASOUND (N/A )  Patient Location: PACU  Anesthesia Type:General  Level of Consciousness: drowsy and patient cooperative  Airway & Oxygen Therapy: Patient Spontanous Breathing and Patient connected to face mask oxygen  Post-op Assessment: Report given to RN and Post -op Vital signs reviewed and stable  Post vital signs: Reviewed and stable  Last Vitals:  Vitals Value Taken Time  BP 120/79 01/23/2019  1:00 PM  Temp    Pulse 87 01/23/2019  1:04 PM  Resp 15 01/23/2019  1:04 PM  SpO2 100 % 01/23/2019  1:04 PM  Vitals shown include unvalidated device data.  Last Pain:  Vitals:   01/23/19 1258  TempSrc:   PainSc: (P) Asleep      Patients Stated Pain Goal: 5 (07/37/10 6269)  Complications: No apparent anesthesia complications

## 2019-01-24 ENCOUNTER — Encounter (HOSPITAL_BASED_OUTPATIENT_CLINIC_OR_DEPARTMENT_OTHER): Payer: Self-pay | Admitting: General Surgery

## 2019-01-24 ENCOUNTER — Other Ambulatory Visit: Payer: Self-pay | Admitting: Family

## 2019-01-25 ENCOUNTER — Ambulatory Visit (HOSPITAL_COMMUNITY)
Admission: RE | Admit: 2019-01-25 | Discharge: 2019-01-25 | Disposition: A | Discharge status: Home / Self Care | Payer: BC Managed Care – PPO | Source: Ambulatory Visit | Attending: Hematology & Oncology | Admitting: Hematology & Oncology

## 2019-01-25 DIAGNOSIS — C8330 Diffuse large B-cell lymphoma, unspecified site: Secondary | ICD-10-CM

## 2019-01-25 DIAGNOSIS — D509 Iron deficiency anemia, unspecified: Secondary | ICD-10-CM | POA: Insufficient documentation

## 2019-01-25 DIAGNOSIS — I358 Other nonrheumatic aortic valve disorders: Secondary | ICD-10-CM | POA: Insufficient documentation

## 2019-01-29 ENCOUNTER — Other Ambulatory Visit: Payer: Self-pay | Admitting: Hematology

## 2019-01-29 DIAGNOSIS — M79604 Pain in right leg: Secondary | ICD-10-CM

## 2019-01-29 DIAGNOSIS — C8330 Diffuse large B-cell lymphoma, unspecified site: Secondary | ICD-10-CM

## 2019-01-29 MED ORDER — TRAMADOL HCL 50 MG PO TABS: 50.0000 mg | ORAL_TABLET | Freq: Three times a day (TID) | ORAL | 0 refills | Status: AC | PRN

## 2019-01-29 NOTE — Progress Notes (Signed)
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 

## 2019-01-30 ENCOUNTER — Inpatient Hospital Stay: Payer: BC Managed Care – PPO

## 2019-01-30 ENCOUNTER — Encounter: Payer: Self-pay | Admitting: Hematology

## 2019-01-30 ENCOUNTER — Other Ambulatory Visit: Payer: Self-pay

## 2019-01-30 ENCOUNTER — Inpatient Hospital Stay (HOSPITAL_BASED_OUTPATIENT_CLINIC_OR_DEPARTMENT_OTHER): Payer: BC Managed Care – PPO | Admitting: Hematology

## 2019-01-30 ENCOUNTER — Encounter: Payer: Self-pay | Admitting: *Deleted

## 2019-01-30 ENCOUNTER — Telehealth: Payer: Self-pay | Admitting: Hematology

## 2019-01-30 ENCOUNTER — Other Ambulatory Visit: Payer: Self-pay | Admitting: Hematology

## 2019-01-30 VITALS — BP 147/83 | HR 84 | Temp 97.9°F | Ht 69.0 in | Wt 182.0 lb

## 2019-01-30 DIAGNOSIS — Z95828 Presence of other vascular implants and grafts: Secondary | ICD-10-CM

## 2019-01-30 DIAGNOSIS — G893 Neoplasm related pain (acute) (chronic): Secondary | ICD-10-CM

## 2019-01-30 DIAGNOSIS — C8330 Diffuse large B-cell lymphoma, unspecified site: Secondary | ICD-10-CM | POA: Diagnosis not present

## 2019-01-30 DIAGNOSIS — C8338 Diffuse large B-cell lymphoma, lymph nodes of multiple sites: Secondary | ICD-10-CM

## 2019-01-30 DIAGNOSIS — D509 Iron deficiency anemia, unspecified: Secondary | ICD-10-CM

## 2019-01-30 DIAGNOSIS — D701 Agranulocytosis secondary to cancer chemotherapy: Secondary | ICD-10-CM

## 2019-01-30 DIAGNOSIS — Z5112 Encounter for antineoplastic immunotherapy: Secondary | ICD-10-CM | POA: Diagnosis not present

## 2019-01-30 DIAGNOSIS — D6481 Anemia due to antineoplastic chemotherapy: Secondary | ICD-10-CM

## 2019-01-30 DIAGNOSIS — K521 Toxic gastroenteritis and colitis: Secondary | ICD-10-CM

## 2019-01-30 DIAGNOSIS — M79604 Pain in right leg: Secondary | ICD-10-CM

## 2019-01-30 LAB — CBC WITH DIFFERENTIAL (CANCER CENTER ONLY)
Abs Immature Granulocytes: 0.03 10*3/uL (ref 0.00–0.07)
Basophils Absolute: 0 10*3/uL (ref 0.0–0.1)
Basophils Relative: 1 %
Eosinophils Absolute: 0 10*3/uL (ref 0.0–0.5)
Eosinophils Relative: 1 %
HEMATOCRIT: 28.7 % — AB (ref 39.0–52.0)
Hemoglobin: 8.5 g/dL — ABNORMAL LOW (ref 13.0–17.0)
Immature Granulocytes: 0 %
Lymphocytes Relative: 13 %
Lymphs Abs: 0.9 10*3/uL (ref 0.7–4.0)
MCH: 22.4 pg — ABNORMAL LOW (ref 26.0–34.0)
MCHC: 29.6 g/dL — ABNORMAL LOW (ref 30.0–36.0)
MCV: 75.7 fL — ABNORMAL LOW (ref 80.0–100.0)
Monocytes Absolute: 0.6 10*3/uL (ref 0.1–1.0)
Monocytes Relative: 8 %
Neutro Abs: 5.6 10*3/uL (ref 1.7–7.7)
Neutrophils Relative %: 77 %
Platelet Count: 340 10*3/uL (ref 150–400)
RBC: 3.79 MIL/uL — ABNORMAL LOW (ref 4.22–5.81)
RDW: 18.6 % — ABNORMAL HIGH (ref 11.5–15.5)
WBC Count: 7.2 10*3/uL (ref 4.0–10.5)
nRBC: 0 % (ref 0.0–0.2)

## 2019-01-30 LAB — URIC ACID: Uric Acid, Serum: 5.8 mg/dL (ref 3.7–8.6)

## 2019-01-30 LAB — CMP (CANCER CENTER ONLY)
ALT: 19 U/L (ref 0–44)
AST: 14 U/L — ABNORMAL LOW (ref 15–41)
Albumin: 4.1 g/dL (ref 3.5–5.0)
Alkaline Phosphatase: 117 U/L (ref 38–126)
Anion gap: 13 (ref 5–15)
BUN: 18 mg/dL (ref 6–20)
CO2: 29 mmol/L (ref 22–32)
Calcium: 10.1 mg/dL (ref 8.9–10.3)
Chloride: 96 mmol/L — ABNORMAL LOW (ref 98–111)
Creatinine: 0.91 mg/dL (ref 0.61–1.24)
GFR, Est AFR Am: >60 mL/min (ref >60)
GFR, Estimated: >60 mL/min (ref >60)
Glucose, Bld: 105 mg/dL — ABNORMAL HIGH (ref 70–99)
Potassium: 4.2 mmol/L (ref 3.5–5.1)
Sodium: 138 mmol/L (ref 135–145)
Total Bilirubin: 0.4 mg/dL (ref 0.3–1.2)
Total Protein: 7.1 g/dL (ref 6.5–8.1)

## 2019-01-30 MED ORDER — PROCHLORPERAZINE MALEATE 10 MG PO TABS: 10.0000 mg | ORAL_TABLET | Freq: Four times a day (QID) | ORAL | 6 refills | Status: AC | PRN

## 2019-01-30 MED ORDER — MORPHINE SULFATE 15 MG PO TABS: 15.0000 mg | ORAL_TABLET | ORAL | 0 refills | Status: DC | PRN

## 2019-01-30 MED ORDER — LIDOCAINE-PRILOCAINE 2.5-2.5 % EX CREA: TOPICAL_CREAM | CUTANEOUS | 3 refills | Status: DC

## 2019-01-30 MED ORDER — SODIUM CHLORIDE (PF) 0.9 % IJ SOLN: Freq: Once | INTRAMUSCULAR | Status: DC

## 2019-01-30 MED ORDER — PREDNISONE 20 MG PO TABS: 100.0000 mg | ORAL_TABLET | Freq: Every day | ORAL | 5 refills | Status: AC

## 2019-01-30 MED ORDER — ONDANSETRON HCL 8 MG PO TABS: 8.0000 mg | ORAL_TABLET | Freq: Two times a day (BID) | ORAL | 1 refills | Status: AC | PRN

## 2019-01-30 MED ORDER — LORAZEPAM 0.5 MG PO TABS: 0.5000 mg | ORAL_TABLET | Freq: Four times a day (QID) | ORAL | 0 refills | Status: AC | PRN

## 2019-01-30 MED ORDER — SODIUM CHLORIDE 0.9% FLUSH
10.0000 mL | INTRAVENOUS | Status: DC | PRN
  Administered 2019-01-30: 10 mL via INTRAVENOUS
  Filled 2019-01-30: qty 10

## 2019-01-30 MED ORDER — HEPARIN SOD (PORK) LOCK FLUSH 100 UNIT/ML IV SOLN
500.0000 [IU] | Freq: Once | INTRAVENOUS | Status: AC
  Administered 2019-01-30: 500 [IU] via INTRAVENOUS
  Filled 2019-01-30: qty 5

## 2019-01-30 NOTE — Patient Instructions (Signed)
Implanted Port Insertion, Care After  This sheet gives you information about how to care for yourself after your procedure. Your health care provider may also give you more specific instructions. If you have problems or questions, contact your health care provider.  What can I expect after the procedure?  After the procedure, it is common to have:  · Discomfort at the port insertion site.  · Bruising on the skin over the port. This should improve over 3-4 days.  Follow these instructions at home:  Port care  · After your port is placed, you will get a manufacturer's information card. The card has information about your port. Keep this card with you at all times.  · Take care of the port as told by your health care provider. Ask your health care provider if you or a family member can get training for taking care of the port at home. A home health care nurse may also take care of the port.  · Make sure to remember what type of port you have.  Incision care         · Follow instructions from your health care provider about how to take care of your port insertion site. Make sure you:  ? Wash your hands with soap and water before and after you change your bandage (dressing). If soap and water are not available, use hand sanitizer.  ? Change your dressing as told by your health care provider.  ? Leave stitches (sutures), skin glue, or adhesive strips in place. These skin closures may need to stay in place for 2 weeks or longer. If adhesive strip edges start to loosen and curl up, you may trim the loose edges. Do not remove adhesive strips completely unless your health care provider tells you to do that.  · Check your port insertion site every day for signs of infection. Check for:  ? Redness, swelling, or pain.  ? Fluid or blood.  ? Warmth.  ? Pus or a bad smell.  Activity  · Return to your normal activities as told by your health care provider. Ask your health care provider what activities are safe for you.  · Do not  lift anything that is heavier than 10 lb (4.5 kg), or the limit that you are told, until your health care provider says that it is safe.  General instructions  · Take over-the-counter and prescription medicines only as told by your health care provider.  · Do not take baths, swim, or use a hot tub until your health care provider approves. Ask your health care provider if you may take showers. You may only be allowed to take sponge baths.  · Do not drive for 24 hours if you were given a sedative during your procedure.  · Wear a medical alert bracelet in case of an emergency. This will tell any health care providers that you have a port.  · Keep all follow-up visits as told by your health care provider. This is important.  Contact a health care provider if:  · You cannot flush your port with saline as directed, or you cannot draw blood from the port.  · You have a fever or chills.  · You have redness, swelling, or pain around your port insertion site.  · You have fluid or blood coming from your port insertion site.  · Your port insertion site feels warm to the touch.  · You have pus or a bad smell coming from the port   insertion site.  Get help right away if:  · You have chest pain or shortness of breath.  · You have bleeding from your port that you cannot control.  Summary  · Take care of the port as told by your health care provider. Keep the manufacturer's information card with you at all times.  · Change your dressing as told by your health care provider.  · Contact a health care provider if you have a fever or chills or if you have redness, swelling, or pain around your port insertion site.  · Keep all follow-up visits as told by your health care provider.  This information is not intended to replace advice given to you by your health care provider. Make sure you discuss any questions you have with your health care provider.  Document Released: 09/19/2013 Document Revised: 06/27/2018 Document Reviewed:  06/27/2018  [employer] Interactive Patient Education © 2019 [employer]

## 2019-01-30 NOTE — Patient Instructions (Signed)
Non-Hodgkin Lymphoma, Adult Non-Hodgkin lymphoma (NHL) is a cancer of the lymphatic system. The lymphatic system is part of your body's defense (immune) system, which protects the body from infections, germs, and diseases. NHL affects a type of white blood cell called lymphocytes. There are different types of NHL. The kind of NHL you have depends on the type of cells it affects and how quickly it grows and spreads. What are the causes? The cause of NHL is not known. What increases the risk? Risks factors for NHL include:  Having a weak immune system, especially after an organ transplant.  Specific bacterial and viral infections including: ? HIV. ? Epstein-Barr virus.  Age. NHL is more common in people over the age of 93. What are the signs or symptoms? Symptoms of NHL may include:  Swelling of the lymph nodes in your neck, armpits, or groin.  Fever.  Chills.  Sweating without cause.  Itchy skin.  Fatigue.  Unexplained weight loss.  Coughing, breathing trouble, and chest pain.  Unexplained weakness.  Swelling of your face, legs, or stomach (abdomen).  Abdominal pain.  Nausea and vomiting.  Loss of appetite.  Headache.  Difficulty concentrating.  Changes in personality.  Seizures. How is this diagnosed? The diagnosis of NHL may include:  A physical exam and medical history.  Blood tests.  Chest X-ray.  Testing of body tissue (biopsy) of your lymph gland or bone marrow.  Different types of scans, including: ? CT scans. ? Positron emission tomography (PET) scan. ? Gallium scan. How is this treated? NHL can be treated in different ways. This depends on your symptoms, the stage of NHL when you were first diagnosed, and the speed with which it is spreading. Treatment may include:  Radiation therapy. This uses radiation to destroy cancer cells.  Chemotherapy. This uses medicine to destroy the cancer cells.  Biological therapy. This uses the body's  immune system to destroy cancer cells.  Bone marrow transplantation.  Blood or platelet transfusions. This may be needed if your blood counts are low. Your cancer will be staged to determine its severity and extent. Your health care provider does staging to find out whether the cancer has spread, and if so, to what parts of your body. You may need to have more tests to determine the stage of your cancer. Follow these instructions at home:  Take medicines only as directed by your health care provider.  Plan rest periods when fatigued.  Keep all follow-up visits as directed by your health care provider. This is important. Contact a health care provider if:  You have new symptoms of NHL.  You have signs of infection. These might include: ? Fever. ? Chills. ? Sore throat. ? Headache. ? Nausea. ? Vomiting. ? Diarrhea. This information is not intended to replace advice given to you by your health care provider. Make sure you discuss any questions you have with your health care provider. Document Released: 06/12/2007 Document Revised: 05/06/2016 Document Reviewed: 10/09/2015 Elsevier Interactive Patient Education  2019 [place].  Rituximab injection What is this medicine? RITUXIMAB (ri TUX i mab) is a monoclonal antibody. It is used to treat certain types of cancer like non-Hodgkin lymphoma and chronic lymphocytic leukemia. It is also used to treat rheumatoid arthritis, granulomatosis with polyangiitis (or Wegener's granulomatosis), microscopic polyangiitis, and pemphigus vulgaris. This medicine may be used for other purposes; ask your health care provider or pharmacist if you have questions. COMMON BRAND NAME(S): Rituxan What should I tell my health care provider  before I take this medicine? They need to know if you have any of these conditions: -heart disease -infection (especially a virus infection such as hepatitis B, chickenpox, cold sores, or herpes) -immune system  problems -irregular heartbeat -kidney disease -low blood counts, like low white cell, platelet, or red cell counts -lung or breathing disease, like asthma -recently received or scheduled to receive a vaccine -an unusual or allergic reaction to rituximab, other medicines, foods, dyes, or preservatives -pregnant or trying to get pregnant -breast-feeding How should I use this medicine? This medicine is for infusion into a vein. It is administered in a hospital or clinic by a specially trained health care professional. A special MedGuide will be given to you by the pharmacist with each prescription and refill. Be sure to read this information carefully each time. Talk to your pediatrician regarding the use of this medicine in children. This medicine is not approved for use in children. Overdosage: If you think you have taken too much of this medicine contact a poison control center or emergency room at once. NOTE: This medicine is only for you. Do not share this medicine with others. What if I miss a dose? It is important not to miss a dose. Call your doctor or health care professional if you are unable to keep an appointment. What may interact with this medicine? -cisplatin -live virus vaccines This list may not describe all possible interactions. Give your health care provider a list of all the medicines, herbs, non-prescription drugs, or dietary supplements you use. Also tell them if you smoke, drink alcohol, or use illegal drugs. Some items may interact with your medicine. What should I watch for while using this medicine? Your condition will be monitored carefully while you are receiving this medicine. You may need blood work done while you are taking this medicine. This medicine can cause serious allergic reactions. To reduce your risk you may need to take medicine before treatment with this medicine. Take your medicine as directed. In some patients, this medicine may cause a serious brain  infection that may cause death. If you have any problems seeing, thinking, speaking, walking, or standing, tell your healthcare professional right away. If you cannot reach your healthcare professional, urgently seek other source of medical care. Call your doctor or health care professional for advice if you get a fever, chills or sore throat, or other symptoms of a cold or flu. Do not treat yourself. This drug decreases your body's ability to fight infections. Try to avoid being around people who are sick. Do not become pregnant while taking this medicine or for 12 months after stopping it. Women should inform their doctor if they wish to become pregnant or think they might be pregnant. There is a potential for serious side effects to an unborn child. Talk to your health care professional or pharmacist for more information. Do not breast-feed an infant while taking this medicine or for 6 months after stopping it. What side effects may I notice from receiving this medicine? Side effects that you should report to your doctor or health care professional as soon as possible: -allergic reactions like skin rash, itching or hives; swelling of the face, lips, or tongue -breathing problems -chest pain -changes in vision -diarrhea -headache with fever, neck stiffness, sensitivity to light, nausea, or confusion -fast, irregular heartbeat -loss of memory -low blood counts - this medicine may decrease the number of white blood cells, red blood cells and platelets. You may be at increased risk  for infections and bleeding. -mouth sores -problems with balance, talking, or walking -redness, blistering, peeling or loosening of the skin, including inside the mouth -signs of infection - fever or chills, cough, sore throat, pain or difficulty passing urine -signs and symptoms of kidney injury like trouble passing urine or change in the amount of urine -signs and symptoms of liver injury like dark yellow or brown  urine; general ill feeling or flu-like symptoms; light-colored stools; loss of appetite; nausea; right upper belly pain; unusually weak or tired; yellowing of the eyes or skin -signs and symptoms of low blood pressure like dizziness; feeling faint or lightheaded, falls; unusually weak or tired -stomach pain -swelling of the ankles, feet, hands -unusual bleeding or bruising -vomiting Side effects that usually do not require medical attention (report to your doctor or health care professional if they continue or are bothersome): -headache -joint pain -muscle cramps or muscle pain -nausea -tiredness This list may not describe all possible side effects. Call your doctor for medical advice about side effects. You may report side effects to FDA at 1-800-FDA-1088. Where should I keep my medicine? This drug is given in a hospital or clinic and will not be stored at home. NOTE: This sheet is a summary. It may not cover all possible information. If you have questions about this medicine, talk to your doctor, pharmacist, or health care provider.  2019 Elsevier/Gold Standard (2017-11-11 13:04:32)  Cyclophosphamide injection What is this medicine? CYCLOPHOSPHAMIDE (sye kloe FOSS fa mide) is a chemotherapy drug. It slows the growth of cancer cells. This medicine is used to treat many types of cancer like lymphoma, myeloma, leukemia, breast cancer, and ovarian cancer, to name a few. This medicine may be used for other purposes; ask your health care provider or pharmacist if you have questions. COMMON BRAND NAME(S): Cytoxan, Neosar What should I tell my health care provider before I take this medicine? They need to know if you have any of these conditions: -blood disorders -history of other chemotherapy -infection -kidney disease -liver disease -recent or ongoing radiation therapy -tumors in the bone marrow -an unusual or allergic reaction to cyclophosphamide, other chemotherapy, other medicines,  foods, dyes, or preservatives -pregnant or trying to get pregnant -breast-feeding How should I use this medicine? This drug is usually given as an injection into a vein or muscle or by infusion into a vein. It is administered in a hospital or clinic by a specially trained health care professional. Talk to your pediatrician regarding the use of this medicine in children. Special care may be needed. Overdosage: If you think you have taken too much of this medicine contact a poison control center or emergency room at once. NOTE: This medicine is only for you. Do not share this medicine with others. What if I miss a dose? It is important not to miss your dose. Call your doctor or health care professional if you are unable to keep an appointment. What may interact with this medicine? This medicine may interact with the following medications: -amiodarone -amphotericin B -azathioprine -certain antiviral medicines for HIV or AIDS such as protease inhibitors (e.g., indinavir, ritonavir) and zidovudine -certain blood pressure medications such as benazepril, captopril, enalapril, fosinopril, lisinopril, moexipril, monopril, perindopril, quinapril, ramipril, trandolapril -certain cancer medications such as anthracyclines (e.g., daunorubicin, doxorubicin), busulfan, cytarabine, paclitaxel, pentostatin, tamoxifen, trastuzumab -certain diuretics such as chlorothiazide, chlorthalidone, hydrochlorothiazide, indapamide, metolazone -certain medicines that treat or prevent blood clots like warfarin -certain muscle relaxants such as succinylcholine -cyclosporine -etanercept -indomethacin -medicines to  increase blood counts like filgrastim, pegfilgrastim, sargramostim -medicines used as general anesthesia -metronidazole -natalizumab This list may not describe all possible interactions. Give your health care provider a list of all the medicines, herbs, non-prescription drugs, or dietary supplements you use.  Also tell them if you smoke, drink alcohol, or use illegal drugs. Some items may interact with your medicine. What should I watch for while using this medicine? Visit your doctor for checks on your progress. This drug may make you feel generally unwell. This is not uncommon, as chemotherapy can affect healthy cells as well as cancer cells. Report any side effects. Continue your course of treatment even though you feel ill unless your doctor tells you to stop. Drink water or other fluids as directed. Urinate often, even at night. In some cases, you may be given additional medicines to help with side effects. Follow all directions for their use. Call your doctor or health care professional for advice if you get a fever, chills or sore throat, or other symptoms of a cold or flu. Do not treat yourself. This drug decreases your body's ability to fight infections. Try to avoid being around people who are sick. This medicine may increase your risk to bruise or bleed. Call your doctor or health care professional if you notice any unusual bleeding. Be careful brushing and flossing your teeth or using a toothpick because you may get an infection or bleed more easily. If you have any dental work done, tell your dentist you are receiving this medicine. You may get drowsy or dizzy. Do not drive, use machinery, or do anything that needs mental alertness until you know how this medicine affects you. Do not become pregnant while taking this medicine or for 1 year after stopping it. Women should inform their doctor if they wish to become pregnant or think they might be pregnant. Men should not father a child while taking this medicine and for 4 months after stopping it. There is a potential for serious side effects to an unborn child. Talk to your health care professional or pharmacist for more information. Do not breast-feed an infant while taking this medicine. This medicine may interfere with the ability to have a  child. This medicine has caused ovarian failure in some women. This medicine has caused reduced sperm counts in some men. You should talk with your doctor or health care professional if you are concerned about your fertility. If you are going to have surgery, tell your doctor or health care professional that you have taken this medicine. What side effects may I notice from receiving this medicine? Side effects that you should report to your doctor or health care professional as soon as possible: -allergic reactions like skin rash, itching or hives, swelling of the face, lips, or tongue -low blood counts - this medicine may decrease the number of white blood cells, red blood cells and platelets. You may be at increased risk for infections and bleeding. -signs of infection - fever or chills, cough, sore throat, pain or difficulty passing urine -signs of decreased platelets or bleeding - bruising, pinpoint red spots on the skin, black, tarry stools, blood in the urine -signs of decreased red blood cells - unusually weak or tired, fainting spells, lightheadedness -breathing problems -dark urine -dizziness -palpitations -swelling of the ankles, feet, hands -trouble passing urine or change in the amount of urine -weight gain -yellowing of the eyes or skin Side effects that usually do not require medical attention (report to your doctor  or health care professional if they continue or are bothersome): -changes in nail or skin color -hair loss -missed menstrual periods -mouth sores -nausea, vomiting This list may not describe all possible side effects. Call your doctor for medical advice about side effects. You may report side effects to FDA at 1-800-FDA-1088. Where should I keep my medicine? This drug is given in a hospital or clinic and will not be stored at home. NOTE: This sheet is a summary. It may not cover all possible information. If you have questions about this medicine, talk to your  doctor, pharmacist, or health care provider.  2019 Elsevier/Gold Standard (2012-10-13 16:22:58)  Doxorubicin injection What is this medicine? DOXORUBICIN (dox oh ROO bi sin) is a chemotherapy drug. It is used to treat many kinds of cancer like leukemia, lymphoma, neuroblastoma, sarcoma, and Wilms' tumor. It is also used to treat bladder cancer, breast cancer, lung cancer, ovarian cancer, stomach cancer, and thyroid cancer. This medicine may be used for other purposes; ask your health care provider or pharmacist if you have questions. COMMON BRAND NAME(S): Adriamycin, Adriamycin PFS, Adriamycin RDF, Rubex What should I tell my health care provider before I take this medicine? They need to know if you have any of these conditions: -heart disease -history of low blood counts caused by a medicine -liver disease -recent or ongoing radiation therapy -an unusual or allergic reaction to doxorubicin, other chemotherapy agents, other medicines, foods, dyes, or preservatives -pregnant or trying to get pregnant -breast-feeding How should I use this medicine? This drug is given as an infusion into a vein. It is administered in a hospital or clinic by a specially trained health care professional. If you have pain, swelling, burning or any unusual feeling around the site of your injection, tell your health care professional right away. Talk to your pediatrician regarding the use of this medicine in children. Special care may be needed. Overdosage: If you think you have taken too much of this medicine contact a poison control center or emergency room at once. NOTE: This medicine is only for you. Do not share this medicine with others. What if I miss a dose? It is important not to miss your dose. Call your doctor or health care professional if you are unable to keep an appointment. What may interact with this medicine? This medicine may interact with the following  medications: -6-mercaptopurine -paclitaxel -phenytoin -St. John's Wort -trastuzumab -verapamil This list may not describe all possible interactions. Give your health care provider a list of all the medicines, herbs, non-prescription drugs, or dietary supplements you use. Also tell them if you smoke, drink alcohol, or use illegal drugs. Some items may interact with your medicine. What should I watch for while using this medicine? This drug may make you feel generally unwell. This is not uncommon, as chemotherapy can affect healthy cells as well as cancer cells. Report any side effects. Continue your course of treatment even though you feel ill unless your doctor tells you to stop. There is a maximum amount of this medicine you should receive throughout your life. The amount depends on the medical condition being treated and your overall health. Your doctor will watch how much of this medicine you receive in your lifetime. Tell your doctor if you have taken this medicine before. You may need blood work done while you are taking this medicine. Your urine may turn red for a few days after your dose. This is not blood. If your urine is dark or brown,  call your doctor. In some cases, you may be given additional medicines to help with side effects. Follow all directions for their use. Call your doctor or health care professional for advice if you get a fever, chills or sore throat, or other symptoms of a cold or flu. Do not treat yourself. This drug decreases your body's ability to fight infections. Try to avoid being around people who are sick. This medicine may increase your risk to bruise or bleed. Call your doctor or health care professional if you notice any unusual bleeding. Talk to your doctor about your risk of cancer. You may be more at risk for certain types of cancers if you take this medicine. Do not become pregnant while taking this medicine or for 6 months after stopping it. Women should  inform their doctor if they wish to become pregnant or think they might be pregnant. Men should not father a child while taking this medicine and for 6 months after stopping it. There is a potential for serious side effects to an unborn child. Talk to your health care professional or pharmacist for more information. Do not breast-feed an infant while taking this medicine. This medicine has caused ovarian failure in some women and reduced sperm counts in some men This medicine may interfere with the ability to have a child. Talk with your doctor or health care professional if you are concerned about your fertility. This medicine may cause a decrease in Co-Enzyme Q-10. You should make sure that you get enough Co-Enzyme Q-10 while you are taking this medicine. Discuss the foods you eat and the vitamins you take with your health care professional. What side effects may I notice from receiving this medicine? Side effects that you should report to your doctor or health care professional as soon as possible: -allergic reactions like skin rash, itching or hives, swelling of the face, lips, or tongue -breathing problems -chest pain -fast or irregular heartbeat -low blood counts - this medicine may decrease the number of white blood cells, red blood cells and platelets. You may be at increased risk for infections and bleeding. -pain, redness, or irritation at site where injected -signs of infection - fever or chills, cough, sore throat, pain or difficulty passing urine -signs of decreased platelets or bleeding - bruising, pinpoint red spots on the skin, black, tarry stools, blood in the urine -swelling of the ankles, feet, hands -tiredness -weakness Side effects that usually do not require medical attention (report to your doctor or health care professional if they continue or are bothersome): -diarrhea -hair loss -mouth sores -nail discoloration or damage -nausea -red colored urine -vomiting This list  may not describe all possible side effects. Call your doctor for medical advice about side effects. You may report side effects to FDA at 1-800-FDA-1088. Where should I keep my medicine? This drug is given in a hospital or clinic and will not be stored at home. NOTE: This sheet is a summary. It may not cover all possible information. If you have questions about this medicine, talk to your doctor, pharmacist, or health care provider.  2019 Elsevier/Gold Standard (2017-07-13 11:01:26)  Vincristine injection What is this medicine? VINCRISTINE (vin KRIS teen) is a chemotherapy drug. It slows the growth of cancer cells. This medicine is used to treat many types of cancer like Hodgkin's disease, leukemia, non-Hodgkin's lymphoma, neuroblastoma (brain cancer), rhabdomyosarcoma, and Wilms' tumor. This medicine may be used for other purposes; ask your health care provider or pharmacist if you have questions.  COMMON BRAND NAME(S): Oncovin, Vincasar PFS What should I tell my health care provider before I take this medicine? They need to know if you have any of these conditions: -blood disorders -gout -infection (especially chickenpox, cold sores, or herpes) -kidney disease -liver disease -lung disease -nervous system disease like Charcot-Marie-Tooth (CMT) -recent or ongoing radiation therapy -an unusual or allergic reaction to vincristine, other chemotherapy agents, other medicines, foods, dyes, or preservatives -pregnant or trying to get pregnant -breast-feeding How should I use this medicine? This drug is given as an infusion into a vein. It is administered in a hospital or clinic by a specially trained health care professional. If you have pain, swelling, burning, or any unusual feeling around the site of your injection, tell your health care professional right away. Talk to your pediatrician regarding the use of this medicine in children. While this drug may be prescribed for selected conditions,  precautions do apply. Overdosage: If you think you have taken too much of this medicine contact a poison control center or emergency room at once. NOTE: This medicine is only for you. Do not share this medicine with others. What if I miss a dose? It is important not to miss your dose. Call your doctor or health care professional if you are unable to keep an appointment. What may interact with this medicine? Do not take this medicine with any of the following medications: -itraconazole -mibefradil -voriconazole This medicine may also interact with the following medications: -cyclosporine -erythromycin -fluconazole -ketoconazole -medicines for HIV like delavirdine, efavirenz, nevirapine -medicines for seizures like ethotoin, fosphenotoin, phenytoin -medicines to increase blood counts like filgrastim, pegfilgrastim, sargramostim -other chemotherapy drugs like cisplatin, L-asparaginase, methotrexate, mitomycin, paclitaxel -pegaspargase -vaccines -zalcitabine, ddC Talk to your doctor or health care professional before taking any of these medicines: -acetaminophen -aspirin -ibuprofen -ketoprofen -naproxen This list may not describe all possible interactions. Give your health care provider a list of all the medicines, herbs, non-prescription drugs, or dietary supplements you use. Also tell them if you smoke, drink alcohol, or use illegal drugs. Some items may interact with your medicine. What should I watch for while using this medicine? Your condition will be monitored carefully while you are receiving this medicine. You will need important blood work done while you are taking this medicine. This drug may make you feel generally unwell. This is not uncommon, as chemotherapy can affect healthy cells as well as cancer cells. Report any side effects. Continue your course of treatment even though you feel ill unless your doctor tells you to stop. In some cases, you may be given additional  medicines to help with side effects. Follow all directions for their use. Call your doctor or health care professional for advice if you get a fever, chills or sore throat, or other symptoms of a cold or flu. Do not treat yourself. Avoid taking products that contain aspirin, acetaminophen, ibuprofen, naproxen, or ketoprofen unless instructed by your doctor. These medicines may hide a fever. Do not become pregnant while taking this medicine. Women should inform their doctor if they wish to become pregnant or think they might be pregnant. There is a potential for serious side effects to an unborn child. Talk to your health care professional or pharmacist for more information. Do not breast-feed an infant while taking this medicine. Men may have a lower sperm count while taking this medicine. Talk to your doctor if you plan to father a child. What side effects may I notice from receiving this medicine? Side effects  that you should report to your doctor or health care professional as soon as possible: -allergic reactions like skin rash, itching or hives, swelling of the face, lips, or tongue -breathing problems -confusion or changes in emotions or moods -constipation -cough -mouth sores -muscle weakness -nausea and vomiting -pain, swelling, redness or irritation at the injection site -pain, tingling, numbness in the hands or feet -problems with balance, talking, walking -seizures -stomach pain -trouble passing urine or change in the amount of urine Side effects that usually do not require medical attention (report to your doctor or health care professional if they continue or are bothersome): -diarrhea -hair loss -jaw pain -loss of appetite This list may not describe all possible side effects. Call your doctor for medical advice about side effects. You may report side effects to FDA at 1-800-FDA-1088. Where should I keep my medicine? This drug is given in a hospital or clinic and will not be  stored at home. NOTE: This sheet is a summary. It may not cover all possible information. If you have questions about this medicine, talk to your doctor, pharmacist, or health care provider.  2019 [employer] Standard (2008-08-26 17:17:13)  Pegfilgrastim injection What is this medicine? PEGFILGRASTIM (PEG fil gra stim) is a long-acting granulocyte colony-stimulating factor that stimulates the growth of neutrophils, a type of white blood cell important in the body's fight against infection. It is used to reduce the incidence of fever and infection in patients with certain types of cancer who are receiving chemotherapy that affects the bone marrow, and to increase survival after being exposed to high doses of radiation. This medicine may be used for other purposes; ask your health care provider or pharmacist if you have questions. COMMON BRAND NAME(S): Domenic Moras, UDENYCA What should I tell my health care provider before I take this medicine? They need to know if you have any of these conditions: -kidney disease -latex allergy -ongoing radiation therapy -sickle cell disease -skin reactions to acrylic adhesives (On-Body Injector only) -an unusual or allergic reaction to pegfilgrastim, filgrastim, other medicines, foods, dyes, or preservatives -pregnant or trying to get pregnant -breast-feeding How should I use this medicine? This medicine is for injection under the skin. If you get this medicine at home, you will be taught how to prepare and give the pre-filled syringe or how to use the On-body Injector. Refer to the patient Instructions for Use for detailed instructions. Use exactly as directed. Tell your healthcare provider immediately if you suspect that the On-body Injector may not have performed as intended or if you suspect the use of the On-body Injector resulted in a missed or partial dose. It is important that you put your used needles and syringes in a special sharps container.  Do not put them in a trash can. If you do not have a sharps container, call your pharmacist or healthcare provider to get one. Talk to your pediatrician regarding the use of this medicine in children. While this drug may be prescribed for selected conditions, precautions do apply. Overdosage: If you think you have taken too much of this medicine contact a poison control center or emergency room at once. NOTE: This medicine is only for you. Do not share this medicine with others. What if I miss a dose? It is important not to miss your dose. Call your doctor or health care professional if you miss your dose. If you miss a dose due to an On-body Injector failure or leakage, a new dose should be administered as  soon as possible using a single prefilled syringe for manual use. What may interact with this medicine? Interactions have not been studied. Give your health care provider a list of all the medicines, herbs, non-prescription drugs, or dietary supplements you use. Also tell them if you smoke, drink alcohol, or use illegal drugs. Some items may interact with your medicine. This list may not describe all possible interactions. Give your health care provider a list of all the medicines, herbs, non-prescription drugs, or dietary supplements you use. Also tell them if you smoke, drink alcohol, or use illegal drugs. Some items may interact with your medicine. What should I watch for while using this medicine? You may need blood work done while you are taking this medicine. If you are going to need a MRI, CT scan, or other procedure, tell your doctor that you are using this medicine (On-Body Injector only). What side effects may I notice from receiving this medicine? Side effects that you should report to your doctor or health care professional as soon as possible: -allergic reactions like skin rash, itching or hives, swelling of the face, lips, or tongue -back pain -dizziness -fever -pain, redness, or  irritation at site where injected -pinpoint red spots on the skin -red or dark-brown urine -shortness of breath or breathing problems -stomach or side pain, or pain at the shoulder -swelling -tiredness -trouble passing urine or change in the amount of urine Side effects that usually do not require medical attention (report to your doctor or health care professional if they continue or are bothersome): -bone pain -muscle pain This list may not describe all possible side effects. Call your doctor for medical advice about side effects. You may report side effects to FDA at 1-800-FDA-1088. Where should I keep my medicine? Keep out of the reach of children. If you are using this medicine at home, you will be instructed on how to store it. Throw away any unused medicine after the expiration date on the label. NOTE: This sheet is a summary. It may not cover all possible information. If you have questions about this medicine, talk to your doctor, pharmacist, or health care provider.  2019 [employer] Standard (2018-03-06 16:57:08)

## 2019-01-30 NOTE — Telephone Encounter (Signed)
Appointments scheduled AVS/Calendar printed per 2/18 los

## 2019-01-30 NOTE — Progress Notes (Addendum)
[facility] OFFICE PROGRESS NOTE  Patient Care Team: Maury Dus, MD as PCP - General (Family Medicine)  HEME/ONC OVERVIEW: 1. 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, GCB subtype; poor prognosis by R-IPI; intermediate risk by CNS-IPI  -12/2018: CT showed cervical lymphadenopathy (left supraclavicular LN ~3 x 1.5cm), abnormal soft tissue masses surrounding bilateral kidneys (R 4.0 x 3.2cm; L 3 x 2.5cm) encasing the ureters, RP adenopathy (largest 3.4cm), and R internal iliac lymphadenopathy (conglomerate 4.5 x 2.5cm) -01/2019:   PET showed extensive FDG-avid adenopathy in the cervical, paratracheal, peri-aortic, mesenteric and iliac LN's, as well as extensive skeletal involvement throughout the axial and appendicular skeleton; expansile soft tissue lesions in the left scapula and right sacrum  L supraclavicular LN bx showed DLBCL, Ki-67 70-80%, GCB subtype; FISH positive for Bcl-2 rearrangement (41.5%) but negative for Bcl-6 and Myc rearrangement   Normal LVEF on TTE  -Mid-01/2019: R-CHOP with Udenyca, plan for 6 cycles   2. 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 The intent is for  cure. -We discussed some of the risks, benefits and side-effects of Rituximab,Cytoxan, Adriamycin,Vincristine and Solumedrol/Prednisone.  -Some of the short term side-effects included, though not limited to, risk of fatigue, weight loss, tumor lysis syndrome, risk of allergic reactions, pancytopenia, life-threatening infections, need for transfusions of blood products, nausea, vomiting, change in bowel habits, hair loss, risk of congestive heart failure, admission to hospital for various reasons, and risks of death.  -Long term side-effects are also discussed including permanent damage to nerve function, chronic fatigue, and rare secondary malignancy including bone marrow disorders.  -The patient is aware that the response rates discussed earlier is not guaranteed.   -After a long discussion, patient made an informed decision to proceed with the prescribed plan of care.  -Patient education material was dispensed -In light of the bulky disease, I recommend the patient to start prednisone 146m daily today x 5 days, and plan to start chemotherapy and rituximab on 02/02/2019  -We will plan to restage with PET after 2 cycles of R-CHOP to assess for interim response  -Prophylaxis: allopurinol, famciclovir  -PRN anti-emetics: Zofran, Compazine, Ativan  CNS prophylaxis -Based on CNS IPI, patient is at high risk for CNS involvement/relapse -Furthermore, he has maxillary bone involvement on PET, which further portends increased risk of CNS disease -I discussed the case at length with the [place]pec[place] Dr. [facility]assell Clementat [facility] who agreed that the patient was at high risk for CNS involvement -She recommended obtaining MRI brain to rule out intracranial abnormalities, as well as diagnostic LP (including flow cytometry and cytology) and one dose of intrathecal MTX (tentatively scheduled on 02/05/2019) after Cycle 1 of R-CHOP -I discussed with the patient some of the benefits and potential risks of  intrathecal MTX, which may include but not limited to chemical arachnoiditis (headache, back pain, nuchal rigidity, and fever) and myelopathy  -Patient has been referred to [facility] for further evaluation of CNS prophylaxis, most likely high-dose IV MTX with subsequent cycles of R-CHOP  Microcytic anemia -Secondary to underlying bone marrow involvement -Hgb 8.5 today, downtrending since last week -Patient denies any symptoms of bleeding -We will monitor it for now  Right lower extremity pain -Likely secondary to bone marrow involvement by DLBCL -Pain not well controlled with tramadol -I have prescribed IR morphine PRN for pain  Orders Placed This Encounter  Procedures  . CBC with Differential (Cancer Center Only)    Standing Status:   Standing    Number of Occurrences:   20    Standing Expiration Date:   01/31/2020  . CMP ([place] only)    Standing Status:   Standing    Number of Occurrences:   20    Standing Expiration Date:   01/31/2020  . PHYSICIAN COMMUNICATION ORDER    A baseline Echo/ [place] should be obtained prior to initiation of Anthracycline Chemotherapy  . PHYSICIAN COMMUNICATION ORDER    Hepatitis B Virus screening with HBsAg and anti-HBc recommended prior to treatment with rituximab (Rituxan), ofatumumab (Arzerra) or obinutuzumab Dyann Kief).   All questions were answered. The patient knows to call the clinic with any problems, questions or concerns. No barriers to learning was detected.  A total of more than 60 minutes were spent face-to-face with the patient during this encounter and over half of that time was spent on counseling and coordination of care as outlined above.   Return in 1 week for labs, port flush, and clinic follow-up.   Tish Men, MD 01/30/2019 2:41 PM  CHIEF COMPLAINT: "I am still having leg pain"  INTERVAL HISTORY: Paul Mason returns to clinic for follow-up of DLBCL.  The patient reports that he initially developed tingling sensation  radiating from the right hip to the foot, with a throbbing sensation in the right foot.  Over the past week, the tingling sensation has mostly resolved, but he still has persistent throbbing sensation in the right foot, which is modestly improved by tramadol.  He tried hydrocodone before, which did not help.  He reports persistent night sweats.  He denies any fever, chill, weight change, chest pain, dyspnea, abdominal pain, nausea, vomiting, diarrhea, or abnormal bleeding/bruising.  SUMMARY OF ONCOLOGIC HISTORY:   Diffuse large B-cell lymphoma ([place])   12/31/2018 Imaging    CT neck: IMPRESSION: Enlarged cervical lymph nodes in the neck as above. Most prominent nodes are in the left supraclavicular region. This may be due to metastatic disease or lymphoma. No primary head and neck cancer identified.    12/31/2018 Imaging    CTA chest: IMPRESSION: 1. 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 Abnormal soft tissue associated with both kidneys and surrounding the proximal left ureter is also likely due to lymphoma. This soft tissue associated with the kidneys and left ureter results in mild bilateral hydronephrosis, left greater than right. 2. Small pleural effusions, mild pulmonary edema, ascites, and fluid and stranding in the mesentery is all likely due to third spacing of fluid. The mesenteric findings surrounds the pancreas but is probably part of the broader process of fluid third-spacing. Recommend clinical correlation to exclude signs of pancreatitis. If there is any concern for pancreatitis, recommend obtaining a lipase. 3. Small pulmonary nodules as above. 4.  No pulmonary emboli. 5. Coronary artery calcifications.    12/31/2018 Imaging    CT abdomen/pelvis: IMPRESSION: 1. 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 Abnormal soft tissue associated  with both kidneys and surrounding the proximal left ureter is also likely due to lymphoma. This soft tissue associated with the kidneys and left ureter results in mild bilateral hydronephrosis, left greater than right. 2. Small pleural effusions, mild pulmonary edema, ascites, and fluid and stranding in the mesentery is all likely due to third spacing of fluid. The mesenteric findings surrounds the pancreas but is probably part of the broader process of fluid third-spacing. Recommend clinical correlation to exclude signs of pancreatitis. If there is any concern for pancreatitis, recommend obtaining a lipase. 3. Small pulmonary nodules as above. 4. No pulmonary emboli. 5. Coronary artery calcifications.    01/01/2019 Initial Diagnosis    Diffuse large B-cell lymphoma ([place])    01/12/2019 Procedure    Left supraclavicular LN biopsy by Dr. Dalbert Batman    01/12/2019 Pathology Results    Accession: [id number]  Tissue-Flow Cytometry - MONOCLONAL B-CELL POPULATION WITH EXPRESSION OF CD10 COMPRISES 80% OF ALL LYMPHOCYTES - SEE COMMENT    01/12/2019 Pathology Results    Accession: [id number]  Lymph node for lymphoma, Left supraclavicular - DIFFUSE LARGE B-CELL LYMPHOMA - SEE COMMENT Microscopic Comment The excisional biopsy material is composed diffuse lymphoid proliferation. The lymphocytes are medium to large with vesicular chromatin, inconspicuous to prominent nucleoli, scant cytoplasm and admixed small benign lymphocytes. The atypical cells are B-cells by CD20 immunohistochemistry with expression of CD10, bcl-6, and bcl-2. The proliferative rate is high by ki-67 (70-80%). The neoplastic cells do not express CD5 or EBV by in-situ hybridization. CD3 highlights background T-cells. Flow cytometry performed on the sample identified a kappa-restricted B-cell population with expression of CD10 that comprised 80% of all lymphocytes (See [id number]).    01/15/2019 Imaging    PET: IMPRESSION: 1.  Extensive hypermetabolic adenopathy in the cervical lymph nodes, paratracheal nodes, periaortic nodes and iliac nodes. 2. Hypermetabolic nodal metastasis within the mesentery of the small bowel colon. 3. Extensive skeletal metastasis within the axillary and appendicular skeleton with intense metabolic activity. Expansile soft tissue lesions in the superior aspect of the LEFT scapula and RIGHT sacrum. These expansile soft tissue bone metastasis may be best targets for biopsy. 4. Normal spleen.     02/01/2019 -  Chemotherapy    The patient had DOXOrubicin (ADRIAMYCIN) chemo injection 102 mg, 50 mg/m2 = 102 mg, Intravenous,  Once, 0 of 6 cycles palonosetron (ALOXI) injection 0.25 mg, 0.25 mg, Intravenous,  Once, 0 of 6 cycles pegfilgrastim-cbqv (UDENYCA) injection 6 mg, 6 mg, Subcutaneous, Once, 0 of 6 cycles vinCRIStine (ONCOVIN) 2 mg in sodium chloride 0.9 % 50 mL chemo infusion, 2 mg, Intravenous,  Once, 0 of 6 cycles methotrexate (PF) 12 mg in sodium chloride (PF) 0.9 % INTRATHECAL chemo injection, , Intrathecal,  Once, 0 of 1 cycle riTUXimab (RITUXAN) 800 mg in sodium chloride 0.9 % 250 mL (2.4242 mg/mL) infusion, 375 mg/m2 = 800 mg, Intravenous,  Once, 0 of 6 cycles cyclophosphamide (CYTOXAN) 1,540 mg in sodium chloride 0.9 % 250 mL chemo infusion, 750 mg/m2 = 1,540 mg, Intravenous,  Once, 0 of 6 cycles  for chemotherapy treatment.      REVIEW OF SYSTEMS:   Constitutional: ( - ) fevers, ( - )  chills , ( + ) night sweats Eyes: ( - ) blurriness of vision, ( - )  double vision, ( - ) watery eyes Ears, nose, mouth, throat, and face: ( - ) mucositis, ( - ) sore throat Respiratory: ( - ) cough, ( - ) dyspnea, ( - ) wheezes Cardiovascular: ( - ) palpitation, ( - ) chest discomfort, ( - ) lower extremity swelling Gastrointestinal:  ( - ) nausea, ( - ) heartburn, ( - ) change in bowel habits Skin: ( - ) abnormal skin rashes Lymphatics: ( - ) new lymphadenopathy, ( - ) easy  bruising Neurological: ( + ) numbness, ( + ) tingling, ( - ) new weaknesses Behavioral/Psych: ( - ) mood change, ( - ) new changes  All other systems were reviewed with the patient and are negative.  I have reviewed the past medical history, past surgical history, social history and family history with the patient and they are unchanged from previous note.  ALLERGIES:  is allergic to penicillins.  MEDICATIONS:  Current Outpatient Medications  Medication Sig Dispense Refill  . acetaminophen (TYLENOL) 325 MG tablet Take 975 mg by mouth every 6 (six) hours as needed for mild pain.    Marland Kitchen allopurinol (ZYLOPRIM) 100 MG tablet Take 1 tablet (100 mg total) by mouth daily. 30 tablet 0  . docusate sodium (COLACE) 100 MG capsule Take 100 mg by mouth 2 (two) times daily.    . famciclovir (FAMVIR) 250 MG tablet Take 1 tablet (250 mg total) by mouth daily. 30 tablet 8  . ferrous sulfate 325 (65 FE) MG EC tablet Take 325 mg by mouth daily.     Marland Kitchen HYDROcodone-acetaminophen (NORCO) 5-325 MG tablet Take 1-2 tablets by mouth every 4 (four) hours as needed for moderate pain or severe pain. 30 tablet 0  . HYDROcodone-acetaminophen (NORCO) 5-325 MG tablet Take 1-2 tablets by mouth every 6 (six) hours as needed for moderate pain or severe pain. 20 tablet 0  . traMADol (ULTRAM) 50 MG tablet Take 1 tablet (50 mg total) by mouth every 8 (eight) hours as needed. 90 tablet 0  . lidocaine-prilocaine (EMLA) cream Apply to affected area once 30 g 3  . LORazepam (ATIVAN) 0.5 MG tablet Take 1 tablet (0.5 mg total) by mouth every 6 (six) hours as needed (Nausea or vomiting). 30 tablet 0  . morphine (MSIR) 15 MG tablet Take 1 tablet (15 mg total) by mouth every 4 (four) hours as needed for up to 30 days for severe pain. 90 tablet 0  . ondansetron (ZOFRAN) 8 MG tablet Take 1 tablet (8 mg total) by mouth 2 (two) times daily as needed for refractory nausea / vomiting. Start on day 3 after cyclophosphamide chemotherapy. 30 tablet 1   . predniSONE (DELTASONE) 20 MG tablet Take 5 tablets (100 mg total) by mouth daily for 5 days. Take on days 1-5 of chemotherapy. 25 tablet 5  . prochlorperazine (COMPAZINE) 10 MG tablet Take 1 tablet (10 mg total) by mouth every 6 (six) hours as needed (Nausea or vomiting). 30 tablet 6   No current facility-administered medications for this visit.    Facility-Administered Medications Ordered in Other Visits  Medication Dose Route Frequency Provider Last Rate Last Dose  . sodium chloride flush (NS) 0.9 % injection 10 mL  10 mL Intravenous PRN Volanda Napoleon, MD   10 mL at 01/30/19 1045    PHYSICAL EXAMINATION: ECOG PERFORMANCE STATUS: 1 - Symptomatic but completely ambulatory  Today's Vitals   01/30/19 1059 01/30/19 1101  BP:  (!) 147/83  Pulse:  84  Temp:  97.9  F (36.6 C)  TempSrc:  Oral  SpO2:  100%  Weight:  182 lb (82.6 kg)  Height:  _0  (1.753 m)  PainSc: 3  0-No pain   Body mass index is 26.88 kg/m.  Filed Weights   01/30/19 1101  Weight: 182 lb (82.6 kg)    GENERAL: alert, no distress and comfortable, slightly pale SKIN: skin color, texture, turgor are normal, no rashes or significant lesions EYES: conjunctiva are pink and non-injected, sclera clear OROPHARYNX: no exudate, no erythema; lips, buccal mucosa, and tongue normal  NECK: supple, non-tender LUNGS: clear to auscultation with normal breathing effort HEART: regular rate & rhythm and no murmurs and no lower extremity edema ABDOMEN: soft, non-tender, non-distended, normal bowel sounds Musculoskeletal: no cyanosis of digits and no clubbing  PSYCH: alert & oriented x 3, fluent speech  LABORATORY DATA:  I have reviewed the data as listed    Component Value Date/Time   NA 138 01/30/2019 1040   K 4.2 01/30/2019 1040   CL 96 (L) 01/30/2019 1040   CO2 29 01/30/2019 1040   GLUCOSE 105 (H) 01/30/2019 1040   BUN 18 01/30/2019 1040   CREATININE 0.91 01/30/2019 1040   CALCIUM 10.1 01/30/2019 1040   PROT 7.1  01/30/2019 1040   ALBUMIN 4.1 01/30/2019 1040   AST 14 (L) 01/30/2019 1040   ALT 19 01/30/2019 1040   ALKPHOS 117 01/30/2019 1040   BILITOT 0.4 01/30/2019 1040   GFRNONAA >60 01/30/2019 1040   GFRAA >60 01/30/2019 1040    No results found for: SPEP, UPEP  Lab Results  Component Value Date   WBC 7.2 01/30/2019   NEUTROABS 5.6 01/30/2019   HGB 8.5 (L) 01/30/2019   HCT 28.7 (L) 01/30/2019   MCV 75.7 (L) 01/30/2019   PLT 340 01/30/2019      Chemistry      Component Value Date/Time   NA 138 01/30/2019 1040   K 4.2 01/30/2019 1040   CL 96 (L) 01/30/2019 1040   CO2 29 01/30/2019 1040   BUN 18 01/30/2019 1040   CREATININE 0.91 01/30/2019 1040      Component Value Date/Time   CALCIUM 10.1 01/30/2019 1040   ALKPHOS 117 01/30/2019 1040   AST 14 (L) 01/30/2019 1040   ALT 19 01/30/2019 1040   BILITOT 0.4 01/30/2019 1040       RADIOGRAPHIC STUDIES: I have personally reviewed the radiological images as listed below and agreed with the findings in the report. Nm Pet Image Initial (pi) Skull Base To Thigh  Result Date: 01/15/2019 CLINICAL DATA:  Initial treatment strategy for lymphadenopathy. EXAM: NUCLEAR MEDICINE PET SKULL BASE TO THIGH TECHNIQUE: 10.0 mCi F-18 FDG was injected intravenously. Full-ring PET imaging was performed from the skull base to thigh after the radiotracer. CT data was obtained and used for attenuation correction and anatomic localization. Fasting blood glucose: 96 mg/dl COMPARISON:  6 CT 12/31/2018 FINDINGS: Mediastinal blood pool activity: SUV max 1.96 NECK: Bilateral small hypermetabolic level 2 lymph nodes. For example lymph nodes on the RIGHT with SUV max equal 9.5 beneath the sternocleidomastoid muscle. Additional level 1 lymph nodes anterior to the submandibular gland with SUV max equal 7.5. These are relatively small with lymph node on the LEFT measuring 11 mm on image 39. Additionally there is hypermetabolic activity in the posterior aspect of the LEFT  maxillary sinus which is favored to be within the bone activity is intense with SUV max equal 11.2 Incidental CT findings: none CHEST: There is a  large mass replacing the superior aspect of the LEFT scapula extending to the musculature posterior to the LEFT scapula with SUV max equal 25.6. This lesion large measuring approximately 7 cm x 5.3 cm (image 50/4). No significant axillary hypermetabolic activity. There are hypermetabolic paratracheal lymph nodes with SUV max equal 10.3. Incidental CT findings: No pulmonary parenchymal findings. ABDOMEN/PELVIS: Hypermetabolic retroperitoneal periaortic lymph nodes. Example mass of lymph nodes surrounding the posterior aspect of the abdominal aorta below the renal veins with SUV max equal 16.7. There is hypermetabolic masses within the mesentery of the small bowel and colon. For example in the LEFT upper quadrant mass lesion measures 4.0 cm with SUV max equal 10.6. Lesion adjacent to the transverse colon measures 3.4 cm with SUV max equal 11.5 Adenopathy extends the pelvis along the internal iliac vessels. For example RIGHT internal iliac adenopathy SUV max equal 12.9. Hypermetabolic thickening in the posterior peritoneal space of the posterior cul-de-sac. Incidental CT findings: none SKELETON: Extensive skeletal metastasis involving the femurs, extensive lesions within the pelvis, spine, and scapula as well as the humeri. Example lesion in the LEFT sacral ala with SUV max equal 17.4 measures approximately 2.8 cm (image 179/4). Incidental CT findings: none IMPRESSION: 1. Extensive hypermetabolic adenopathy in the cervical lymph nodes, paratracheal nodes, periaortic nodes and iliac nodes. 2. Hypermetabolic nodal metastasis within the mesentery of the small bowel colon. 3. Extensive skeletal metastasis within the axillary and appendicular skeleton with intense metabolic activity. Expansile soft tissue lesions in the superior aspect of the LEFT scapula and RIGHT sacrum. These  expansile soft tissue bone metastasis may be best targets for biopsy. 4. Normal spleen. Electronically Signed   By: Suzy Bouchard M.D.   On: 01/15/2019 15:27   Dg Chest Port 1 View  Result Date: 01/23/2019 CLINICAL DATA:  Status post Port-A-Cath placement. EXAM: PORTABLE CHEST 1 VIEW COMPARISON:  Radiographs of December 31, 2018. FINDINGS: The heart size and mediastinal contours are within normal limits. Both lungs are clear. Right subclavian Port-A-Cath is noted with distal tip in expected position of the SVC. No pneumothorax or pleural effusion is noted. The visualized skeletal structures are unremarkable. IMPRESSION: Status post right subclavian Port-A-Cath placement. No acute cardiopulmonary abnormality seen. Electronically Signed   By: Marijo Conception, M.D.   On: 01/23/2019 13:36   Dg Fluoro Guide Cv Line-no Report  Result Date: 01/23/2019 Fluoroscopy was utilized by the requesting physician.  No radiographic interpretation.

## 2019-01-31 LAB — LACTATE DEHYDROGENASE: LDH: 1013 U/L — ABNORMAL HIGH (ref 98–192)

## 2019-02-01 ENCOUNTER — Other Ambulatory Visit (HOSPITAL_COMMUNITY): Payer: BC Managed Care – PPO

## 2019-02-01 ENCOUNTER — Ambulatory Visit (HOSPITAL_COMMUNITY): Payer: BC Managed Care – PPO

## 2019-02-01 ENCOUNTER — Other Ambulatory Visit: Payer: Self-pay

## 2019-02-01 ENCOUNTER — Inpatient Hospital Stay: Payer: BC Managed Care – PPO

## 2019-02-01 VITALS — BP 129/74 | HR 86 | Temp 98.2°F | Resp 17

## 2019-02-01 DIAGNOSIS — C8338 Diffuse large B-cell lymphoma, lymph nodes of multiple sites: Secondary | ICD-10-CM

## 2019-02-01 DIAGNOSIS — Z5112 Encounter for antineoplastic immunotherapy: Secondary | ICD-10-CM | POA: Diagnosis not present

## 2019-02-01 MED ORDER — VINCRISTINE SULFATE CHEMO INJECTION 1 MG/ML
2.0000 mg | Freq: Once | INTRAVENOUS | Status: AC
  Administered 2019-02-01: 2 mg via INTRAVENOUS
  Filled 2019-02-01: qty 2

## 2019-02-01 MED ORDER — SODIUM CHLORIDE 0.9% FLUSH
10.0000 mL | INTRAVENOUS | Status: DC | PRN
  Administered 2019-02-01: 10 mL
  Filled 2019-02-01: qty 10

## 2019-02-01 MED ORDER — SODIUM CHLORIDE 0.9 % IV SOLN
750.0000 mg/m2 | Freq: Once | INTRAVENOUS | Status: AC
  Administered 2019-02-01: 1540 mg via INTRAVENOUS
  Filled 2019-02-01: qty 77

## 2019-02-01 MED ORDER — DIPHENHYDRAMINE HCL 25 MG PO CAPS
50.0000 mg | ORAL_CAPSULE | Freq: Once | ORAL | Status: AC
  Administered 2019-02-01: 50 mg via ORAL

## 2019-02-01 MED ORDER — ACETAMINOPHEN 325 MG PO TABS
ORAL_TABLET | ORAL | Status: AC
  Filled 2019-02-01: qty 2

## 2019-02-01 MED ORDER — PALONOSETRON HCL INJECTION 0.25 MG/5ML
0.2500 mg | Freq: Once | INTRAVENOUS | Status: AC
  Administered 2019-02-01: 0.25 mg via INTRAVENOUS

## 2019-02-01 MED ORDER — SODIUM CHLORIDE 0.9 % IV SOLN
375.0000 mg/m2 | Freq: Once | INTRAVENOUS | Status: AC
  Administered 2019-02-01: 800 mg via INTRAVENOUS
  Filled 2019-02-01: qty 50

## 2019-02-01 MED ORDER — SODIUM CHLORIDE 0.9 % IV SOLN
Freq: Once | INTRAVENOUS | Status: AC
  Administered 2019-02-01: 09:00:00 via INTRAVENOUS
  Filled 2019-02-01: qty 250

## 2019-02-01 MED ORDER — DOXORUBICIN HCL CHEMO IV INJECTION 2 MG/ML
49.5000 mg/m2 | Freq: Once | INTRAVENOUS | Status: AC
  Administered 2019-02-01: 100 mg via INTRAVENOUS
  Filled 2019-02-01: qty 50

## 2019-02-01 MED ORDER — HEPARIN SOD (PORK) LOCK FLUSH 100 UNIT/ML IV SOLN
500.0000 [IU] | Freq: Once | INTRAVENOUS | Status: AC | PRN
  Administered 2019-02-01: 500 [IU]
  Filled 2019-02-01: qty 5

## 2019-02-01 MED ORDER — DEXAMETHASONE SODIUM PHOSPHATE 10 MG/ML IJ SOLN
INTRAMUSCULAR | Status: AC
  Filled 2019-02-01: qty 1

## 2019-02-01 MED ORDER — DEXAMETHASONE SODIUM PHOSPHATE 10 MG/ML IJ SOLN
10.0000 mg | Freq: Once | INTRAMUSCULAR | Status: AC
  Administered 2019-02-01: 10 mg via INTRAVENOUS

## 2019-02-01 MED ORDER — PALONOSETRON HCL INJECTION 0.25 MG/5ML
INTRAVENOUS | Status: AC
  Filled 2019-02-01: qty 5

## 2019-02-01 MED ORDER — ACETAMINOPHEN 325 MG PO TABS
650.0000 mg | ORAL_TABLET | Freq: Once | ORAL | Status: AC
  Administered 2019-02-01: 650 mg via ORAL

## 2019-02-01 MED ORDER — DIPHENHYDRAMINE HCL 25 MG PO CAPS
ORAL_CAPSULE | ORAL | Status: AC
  Filled 2019-02-01: qty 2

## 2019-02-01 NOTE — Patient Instructions (Addendum)
[facility] Discharge Instructions for Patients Receiving Chemotherapy  Today you received the following chemotherapy agents Rituxan, Cytoxan, Adriamycin, Vincristine  To help prevent nausea and vomiting after your treatment, we encourage you to take your nausea medication   Beginning tonight, 02/01/19 you can take Prochlorperazine (Compazine)every 6 hours as needed for nausea Or you can take Ativan by mouth every 6 hours as needed for nausea. Beginning 3rd day after chemo, on 02/04/19, if you are experiencing nausea, you can take Zofran (Ondansetron) once in morning, once in the evening as needed for nausea/vomiting.     If you develop nausea and vomiting that is not controlled by your nausea medication, call the clinic.   BELOW ARE SYMPTOMS THAT SHOULD BE REPORTED IMMEDIATELY:  *FEVER GREATER THAN 100.5 F  *CHILLS WITH OR WITHOUT FEVER  NAUSEA AND VOMITING THAT IS NOT CONTROLLED WITH YOUR NAUSEA MEDICATION  *UNUSUAL SHORTNESS OF BREATH  *UNUSUAL BRUISING OR BLEEDING  TENDERNESS IN MOUTH AND THROAT WITH OR WITHOUT PRESENCE OF ULCERS  *URINARY PROBLEMS  *BOWEL PROBLEMS  UNUSUAL RASH Items with * indicate a potential emergency and should be followed up as soon as possible.  Feel free to call the clinic should you have any questions or concerns. The clinic phone number is [phone]  Please show the [name] at check-in to the Emergency Department and triage nurse.

## 2019-02-01 NOTE — Progress Notes (Signed)
Ok to proceed with treatment per Dr Maylon Peppers

## 2019-02-02 ENCOUNTER — Ambulatory Visit (HOSPITAL_COMMUNITY)
Admission: RE | Admit: 2019-02-02 | Discharge: 2019-02-02 | Disposition: A | Discharge status: Home / Self Care | Payer: BC Managed Care – PPO | Source: Ambulatory Visit | Attending: Hematology | Admitting: Hematology

## 2019-02-02 ENCOUNTER — Encounter (HOSPITAL_COMMUNITY): Payer: Self-pay | Admitting: Hematology

## 2019-02-02 ENCOUNTER — Inpatient Hospital Stay: Payer: BC Managed Care – PPO

## 2019-02-02 ENCOUNTER — Other Ambulatory Visit: Payer: Self-pay | Admitting: Hematology

## 2019-02-02 VITALS — BP 144/85 | HR 84 | Temp 97.9°F | Resp 17

## 2019-02-02 DIAGNOSIS — C8338 Diffuse large B-cell lymphoma, lymph nodes of multiple sites: Secondary | ICD-10-CM

## 2019-02-02 DIAGNOSIS — Z5112 Encounter for antineoplastic immunotherapy: Secondary | ICD-10-CM | POA: Diagnosis not present

## 2019-02-02 MED ORDER — GADOBUTROL 1 MMOL/ML IV SOLN
8.0000 mL | Freq: Once | INTRAVENOUS | Status: AC | PRN
  Administered 2019-02-02: 8 mL via INTRAVENOUS

## 2019-02-02 MED ORDER — PEGFILGRASTIM-CBQV 6 MG/0.6ML ~~LOC~~ SOSY
6.0000 mg | PREFILLED_SYRINGE | Freq: Once | SUBCUTANEOUS | Status: AC
  Administered 2019-02-02: 6 mg via SUBCUTANEOUS

## 2019-02-02 MED ORDER — PEGFILGRASTIM-CBQV 6 MG/0.6ML ~~LOC~~ SOSY
PREFILLED_SYRINGE | SUBCUTANEOUS | Status: AC
  Filled 2019-02-02: qty 0.6

## 2019-02-02 NOTE — Patient Instructions (Signed)
Pegfilgrastim injection  What is this medicine?  PEGFILGRASTIM (PEG fil gra stim) is a long-acting granulocyte colony-stimulating factor that stimulates the growth of neutrophils, a type of white blood cell important in the body's fight against infection. It is used to reduce the incidence of fever and infection in patients with certain types of cancer who are receiving chemotherapy that affects the bone marrow, and to increase survival after being exposed to high doses of radiation.  This medicine may be used for other purposes; ask your health care provider or pharmacist if you have questions.  COMMON BRAND NAME(S): Fulphila, Neulasta, UDENYCA  What should I tell my health care provider before I take this medicine?  They need to know if you have any of these conditions:  -kidney disease  -latex allergy  -ongoing radiation therapy  -sickle cell disease  -skin reactions to acrylic adhesives (On-Body Injector only)  -an unusual or allergic reaction to pegfilgrastim, filgrastim, other medicines, foods, dyes, or preservatives  -pregnant or trying to get pregnant  -breast-feeding  How should I use this medicine?  This medicine is for injection under the skin. If you get this medicine at home, you will be taught how to prepare and give the pre-filled syringe or how to use the On-body Injector. Refer to the patient Instructions for Use for detailed instructions. Use exactly as directed. Tell your healthcare provider immediately if you suspect that the On-body Injector may not have performed as intended or if you suspect the use of the On-body Injector resulted in a missed or partial dose.  It is important that you put your used needles and syringes in a special sharps container. Do not put them in a trash can. If you do not have a sharps container, call your pharmacist or healthcare provider to get one.  Talk to your pediatrician regarding the use of this medicine in children. While this drug may be prescribed for  selected conditions, precautions do apply.  Overdosage: If you think you have taken too much of this medicine contact a poison control center or emergency room at once.  NOTE: This medicine is only for you. Do not share this medicine with others.  What if I miss a dose?  It is important not to miss your dose. Call your doctor or health care professional if you miss your dose. If you miss a dose due to an On-body Injector failure or leakage, a new dose should be administered as soon as possible using a single prefilled syringe for manual use.  What may interact with this medicine?  Interactions have not been studied.  Give your health care provider a list of all the medicines, herbs, non-prescription drugs, or dietary supplements you use. Also tell them if you smoke, drink alcohol, or use illegal drugs. Some items may interact with your medicine.  This list may not describe all possible interactions. Give your health care provider a list of all the medicines, herbs, non-prescription drugs, or dietary supplements you use. Also tell them if you smoke, drink alcohol, or use illegal drugs. Some items may interact with your medicine.  What should I watch for while using this medicine?  You may need blood work done while you are taking this medicine.  If you are going to need a MRI, CT scan, or other procedure, tell your doctor that you are using this medicine (On-Body Injector only).  What side effects may I notice from receiving this medicine?  Side effects that you should report to   your doctor or health care professional as soon as possible:  -allergic reactions like skin rash, itching or hives, swelling of the face, lips, or tongue  -back pain  -dizziness  -fever  -pain, redness, or irritation at site where injected  -pinpoint red spots on the skin  -red or dark-brown urine  -shortness of breath or breathing problems  -stomach or side pain, or pain at the shoulder  -swelling  -tiredness  -trouble passing urine or  change in the amount of urine  Side effects that usually do not require medical attention (report to your doctor or health care professional if they continue or are bothersome):  -bone pain  -muscle pain  This list may not describe all possible side effects. Call your doctor for medical advice about side effects. You may report side effects to FDA at 1-800-FDA-1088.  Where should I keep my medicine?  Keep out of the reach of children.  If you are using this medicine at home, you will be instructed on how to store it. Throw away any unused medicine after the expiration date on the label.  NOTE: This sheet is a summary. It may not cover all possible information. If you have questions about this medicine, talk to your doctor, pharmacist, or health care provider.   2019 Elsevier/Gold Standard (2018-03-06 16:57:08)

## 2019-02-02 NOTE — Progress Notes (Signed)
Ok to give [place] at this time per Lenna Sciara, pharmacist since Cytoxan was completed at 1122 yesterday. dph

## 2019-02-05 ENCOUNTER — Ambulatory Visit (HOSPITAL_COMMUNITY): Payer: BC Managed Care – PPO

## 2019-02-05 ENCOUNTER — Ambulatory Visit (HOSPITAL_COMMUNITY)
Admission: RE | Admit: 2019-02-05 | Discharge: 2019-02-05 | Disposition: A | Discharge status: Home / Self Care | Payer: BC Managed Care – PPO | Source: Ambulatory Visit | Attending: Hematology | Admitting: Hematology

## 2019-02-05 ENCOUNTER — Encounter (HOSPITAL_COMMUNITY): Payer: Self-pay

## 2019-02-05 ENCOUNTER — Other Ambulatory Visit: Payer: Self-pay

## 2019-02-05 VITALS — BP 134/80 | HR 84 | Temp 98.0°F | Resp 18

## 2019-02-05 DIAGNOSIS — C8338 Diffuse large B-cell lymphoma, lymph nodes of multiple sites: Secondary | ICD-10-CM

## 2019-02-05 LAB — PROTEIN, CSF: Total  Protein, CSF: 46 mg/dL — ABNORMAL HIGH (ref 15–45)

## 2019-02-05 LAB — CSF CELL COUNT WITH DIFFERENTIAL
RBC Count, CSF: 2 /mm3 — ABNORMAL HIGH
Tube #: 4
WBC, CSF: 3 /mm3 (ref 0–5)

## 2019-02-05 LAB — GLUCOSE, CSF: Glucose, CSF: 58 mg/dL (ref 40–70)

## 2019-02-05 MED ORDER — SODIUM CHLORIDE (PF) 0.9 % IJ SOLN
Freq: Once | INTRAMUSCULAR | Status: DC
  Administered 2019-02-05: 11:00:00 via INTRATHECAL
  Filled 2019-02-05 (×2): qty 0.48

## 2019-02-05 NOTE — Discharge Instructions (Signed)
Lumbar Puncture, Care After  This sheet gives you information about how to care for yourself after your procedure. Your health care provider may also give you more specific instructions. If you have problems or questions, contact your health care provider.  What can I expect after the procedure?  After the procedure, it is common to have:   Mild discomfort or pain at the puncture site.   A mild headache that is relieved with pain medicines.  Follow these instructions at home:  Activity     Lie down flat or rest for as long as directed by your health care provider.   Return to your normal activities as told by your health care provider. Ask your health care provider what activities are safe for you.   Avoid lifting anything heavier than 10 lb (4.5 kg) for at least 12 hours after the procedure.   Do not drive for 24 hours if you were given a medicine to help you relax (sedative) during your procedure.   Do not drive or use heavy machinery while taking prescription pain medicine.  Puncture site care   Remove or change your bandage (dressing) as told by your health care provider.   Check your puncture area every day for signs of infection. Check for:  ? More pain.  ? Redness or swelling.  ? Fluid or blood leaking from the puncture site.  ? Warmth.  ? Pus or a bad smell.  General instructions   Take over-the-counter and prescription medicines only as told by your health care provider.   Drink enough fluids to keep your urine clear or pale yellow. Your health care provider may recommend drinking caffeine to prevent a headache.   Keep all follow-up visits as told by your health care provider. This is important.  Contact a health care provider if:   You have fever or chills.   You have nausea or vomiting.   You have a headache that lasts for more than 2 days or does not get better with medicine.  Get help right away if:   You develop any of the following in your  legs:  ? Weakness.  ? Numbness.  ? Tingling.   You are unable to control when you urinate or have a bowel movement (incontinence).   You have signs of infection around your puncture site, such as:  ? More pain.  ? Redness or swelling.  ? Fluid or blood leakage.  ? Warmth.  ? Pus or a bad smell.   You are dizzy or you feel like you might faint.   You have a severe headache, especially when you sit or stand.  Summary   A lumbar puncture is a procedure in which a small needle is inserted into the lower back to remove fluid that surrounds the brain and spinal cord.   After this procedure, it is common to have a headache and pain around the needle insertion area.   Lying flat, staying hydrated, and drinking caffeine can help prevent headaches.   Monitor your needle insertion site for signs of infection, including warmth, fluid, or more pain.   Get help right away if you develop leg weakness, leg numbness, incontinence, or severe headaches.  This information is not intended to replace advice given to you by your health care provider. Make sure you discuss any questions you have with your health care provider.  Document Released: 12/04/2013 Document Revised: 01/12/2017 Document Reviewed: 01/12/2017  Elsevier Interactive Patient Education  2019 [employer]

## 2019-02-07 ENCOUNTER — Other Ambulatory Visit: Payer: Self-pay | Admitting: Hematology

## 2019-02-07 ENCOUNTER — Other Ambulatory Visit: Payer: Self-pay | Admitting: *Deleted

## 2019-02-07 ENCOUNTER — Telehealth: Payer: Self-pay | Admitting: *Deleted

## 2019-02-07 DIAGNOSIS — K1230 Oral mucositis (ulcerative), unspecified: Secondary | ICD-10-CM

## 2019-02-07 MED ORDER — MAGIC MOUTHWASH W/LIDOCAINE: 5.0000 mL | Freq: Four times a day (QID) | ORAL | 5 refills | Status: DC | PRN

## 2019-02-07 MED ORDER — FLUCONAZOLE 100 MG PO TABS: ORAL_TABLET | ORAL | 0 refills | Status: DC

## 2019-02-07 NOTE — Telephone Encounter (Signed)
I called and left a message on patient's cell phone informing him that Dr. Maylon Peppers sent two prescriptions to [facility] on [address]. He needs to start [place] and [place].

## 2019-02-07 NOTE — Progress Notes (Unsigned)
agic

## 2019-02-09 ENCOUNTER — Other Ambulatory Visit: Payer: Self-pay

## 2019-02-09 ENCOUNTER — Encounter: Payer: Self-pay | Admitting: Hematology

## 2019-02-09 ENCOUNTER — Inpatient Hospital Stay: Payer: BC Managed Care – PPO

## 2019-02-09 ENCOUNTER — Inpatient Hospital Stay (HOSPITAL_BASED_OUTPATIENT_CLINIC_OR_DEPARTMENT_OTHER): Payer: BC Managed Care – PPO | Admitting: Hematology

## 2019-02-09 VITALS — BP 136/73 | HR 83 | Temp 97.9°F | Resp 17 | Wt 179.5 lb

## 2019-02-09 DIAGNOSIS — G893 Neoplasm related pain (acute) (chronic): Secondary | ICD-10-CM

## 2019-02-09 DIAGNOSIS — K521 Toxic gastroenteritis and colitis: Secondary | ICD-10-CM

## 2019-02-09 DIAGNOSIS — Z5112 Encounter for antineoplastic immunotherapy: Secondary | ICD-10-CM | POA: Diagnosis not present

## 2019-02-09 DIAGNOSIS — D701 Agranulocytosis secondary to cancer chemotherapy: Secondary | ICD-10-CM | POA: Diagnosis not present

## 2019-02-09 DIAGNOSIS — C8338 Diffuse large B-cell lymphoma, lymph nodes of multiple sites: Secondary | ICD-10-CM

## 2019-02-09 DIAGNOSIS — C8330 Diffuse large B-cell lymphoma, unspecified site: Secondary | ICD-10-CM

## 2019-02-09 DIAGNOSIS — D6481 Anemia due to antineoplastic chemotherapy: Secondary | ICD-10-CM

## 2019-02-09 DIAGNOSIS — M79604 Pain in right leg: Secondary | ICD-10-CM

## 2019-02-09 DIAGNOSIS — D508 Other iron deficiency anemias: Secondary | ICD-10-CM

## 2019-02-09 DIAGNOSIS — T451X5A Adverse effect of antineoplastic and immunosuppressive drugs, initial encounter: Secondary | ICD-10-CM

## 2019-02-09 LAB — CMP (CANCER CENTER ONLY)
ALT: 15 U/L (ref 0–44)
AST: 8 U/L — ABNORMAL LOW (ref 15–41)
Albumin: 4 g/dL (ref 3.5–5.0)
Alkaline Phosphatase: 99 U/L (ref 38–126)
Anion gap: 7 (ref 5–15)
BUN: 19 mg/dL (ref 6–20)
CO2: 29 mmol/L (ref 22–32)
Calcium: 8.5 mg/dL — ABNORMAL LOW (ref 8.9–10.3)
Chloride: 101 mmol/L (ref 98–111)
Creatinine: 0.79 mg/dL (ref 0.61–1.24)
GFR, Est AFR Am: >60 mL/min (ref >60)
Glucose, Bld: 128 mg/dL — ABNORMAL HIGH (ref 70–99)
Potassium: 4.2 mmol/L (ref 3.5–5.1)
Sodium: 137 mmol/L (ref 135–145)
Total Bilirubin: 0.3 mg/dL (ref 0.3–1.2)
Total Protein: 6.2 g/dL — ABNORMAL LOW (ref 6.5–8.1)

## 2019-02-09 LAB — CBC WITH DIFFERENTIAL (CANCER CENTER ONLY)
Abs Immature Granulocytes: 0.03 10*3/uL (ref 0.00–0.07)
Basophils Absolute: 0 10*3/uL (ref 0.0–0.1)
Basophils Relative: 1 %
Eosinophils Absolute: 0.2 10*3/uL (ref 0.0–0.5)
Eosinophils Relative: 7 %
HCT: 28.1 % — ABNORMAL LOW (ref 39.0–52.0)
Hemoglobin: 8.3 g/dL — ABNORMAL LOW (ref 13.0–17.0)
Immature Granulocytes: 1 %
Lymphocytes Relative: 36 %
Lymphs Abs: 0.8 10*3/uL (ref 0.7–4.0)
MCH: 22.4 pg — ABNORMAL LOW (ref 26.0–34.0)
MCHC: 29.5 g/dL — ABNORMAL LOW (ref 30.0–36.0)
MCV: 75.7 fL — ABNORMAL LOW (ref 80.0–100.0)
Monocytes Absolute: 0.1 10*3/uL (ref 0.1–1.0)
Monocytes Relative: 5 %
Neutro Abs: 1.1 10*3/uL — ABNORMAL LOW (ref 1.7–7.7)
Neutrophils Relative %: 50 %
Platelet Count: 190 10*3/uL (ref 150–400)
RBC: 3.71 MIL/uL — ABNORMAL LOW (ref 4.22–5.81)
RDW: 18.3 % — ABNORMAL HIGH (ref 11.5–15.5)
WBC Count: 2.2 10*3/uL — ABNORMAL LOW (ref 4.0–10.5)
nRBC: 0 % (ref 0.0–0.2)

## 2019-02-09 MED ORDER — HEPARIN SOD (PORK) LOCK FLUSH 100 UNIT/ML IV SOLN
500.0000 [IU] | Freq: Once | INTRAVENOUS | Status: AC | PRN
  Administered 2019-02-09: 500 [IU]
  Filled 2019-02-09: qty 5

## 2019-02-09 MED ORDER — SODIUM CHLORIDE 0.9% FLUSH
10.0000 mL | Freq: Once | INTRAVENOUS | Status: AC | PRN
  Administered 2019-02-09: 10 mL
  Filled 2019-02-09: qty 10

## 2019-02-09 NOTE — Progress Notes (Signed)
[place] OFFICE PROGRESS NOTE  Patient Care Team: Maury Dus, MD as PCP - General (Family Medicine)  HEME/ONC OVERVIEW: 1. 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, GCB subtype; poor prognosis by R-IPI; intermediate risk by CNS-IPI  -12/2018: CT showed cervical lymphadenopathy (left supraclavicular LN ~3 x 1.5cm), abnormal soft tissue masses surrounding bilateral kidneys (R 4.0 x 3.2cm; L 3 x 2.5cm) encasing the ureters, RP adenopathy (largest 3.4cm), and R internal iliac lymphadenopathy (conglomerate 4.5 x 2.5cm) -01/2019:   PET showed extensive FDG-avid adenopathy in the cervical, paratracheal, peri-aortic, mesenteric and iliac LN's, as well as extensive skeletal involvement throughout the axial and appendicular skeleton; expansile soft tissue lesions in the left scapula and right sacrum  L supraclavicular LN bx showed DLBCL, Ki-67 70-80%, GCB subtype; FISH positive for Bcl-2 rearrangement (41.5%) but negative for Bcl-6 and Myc rearrangement   Normal LVEF on TTE   MRI brain negative for any CNS involvement -Mid-01/2019: R-CHOP with Udenyca, plan for 6 cycles   2. 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 Cassell Clement at [facility] on 02/14/2019 to discuss the benefits and risks of HD MTX -Cycle 2 of R-CHOP scheduled on 02/22/2019; Neulasta on 02/23/2019 -If he agrees to proceed with HD MTX, then we will need to coordinate the schedules of R-CHOP and HD MTX, including the timing of Neulasta, as it would be preferable to avoid administering HD MTX after Neulasta injection -We will plan to restage after 3 cycles of  R-CHOP to assess interim response -Ppx: allopurinol, famciclovir -PRN anti-emetics: Zofran, Compazine, Ativan   CNS prophylaxis -At high risk for CNS involvement/relapse based on CNS IPI -I independently reviewed the radiologic images of MRI brain, and agree with the findings as documented -In summary, MRI brain negative for any evidence of CNS involvement by lymphoma -S/p IT MTX on 02/14/2019; he tolerated it well without any significant side effects -CSF cytology negative for any lymphoma; flow pending  -He is scheduled to meet Dr. Cassell Clement at [facility] on 02/14/2019 to discuss the role for HD MTX -The timing of HD MTX to be determined  Chemotherapy-associated anemia -Secondary to chemotherapy and bone marrow involvement by lymphoma -Hgb 8.3, stable -Patient denies any symptoms of bleeding -We will monitor for now; no indication for dose adjustment  Chemotherapy-associated leukopenia -Secondary to chemotherapy -WBC 2.2k with ANC 1100 -Patient denies any symptoms of infection -We will monitor for now; no indication for dose adjustment  Chemotherapy-associated diarrhea -Secondary to R-CHOP -Continue PRN Imodium  Right lower extremity pain -Secondary to bone marrow involvement by lymphoma -Pain relatively well controlled -Continue PRN IR morphine   No orders of the defined types were placed in this encounter.  All questions were answered. The patient knows to call the clinic with any problems, questions or concerns. No barriers to learning was detected.  A total of more than 40 minutes were spent face-to-face with the patient during this encounter and over half of that time was spent on counseling and coordination of care as outlined above.   Tish Men, MD 02/09/2019 3:02 PM  CHIEF COMPLAINT: "I am doing fine so far"  INTERVAL HISTORY: Mr. Arzola returns to clinic for follow-up of DLBCL after cycle 1 of R-CHOP with Neulasta and intrathecal methotrexate.  Patient reports that  he tolerated the treatment relatively well, including intrathecal methotrexate, and did not have any headache, nausea, or vomiting after the procedure.  He had one episode of diarrhea yesterday, large-volume, without any hematochezia or melena, and it resolved without intervention.  He otherwise reports feeling well, denies any new complaint today.  SUMMARY OF ONCOLOGIC HISTORY:   Diffuse large B-cell lymphoma (HCC)   12/31/2018 Imaging    CT neck: IMPRESSION: Enlarged cervical lymph nodes in the neck as above. Most prominent nodes are in the left supraclavicular region. This may be due to metastatic disease or lymphoma. No primary head and neck cancer identified.    12/31/2018 Imaging    CTA chest: IMPRESSION: 1. 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 Abnormal soft tissue associated with both kidneys and surrounding the proximal left ureter is also likely due to lymphoma. This soft tissue associated with the kidneys and left ureter results in mild bilateral hydronephrosis, left greater than right. 2. Small pleural effusions, mild pulmonary edema, ascites, and fluid and stranding in the mesentery is all likely due to third spacing of fluid. The mesenteric findings surrounds the pancreas but is probably part of the broader process of fluid third-spacing. Recommend clinical correlation to exclude signs of pancreatitis. If there is any concern for pancreatitis, recommend obtaining a lipase. 3. Small pulmonary nodules as above. 4. No pulmonary emboli. 5. Coronary artery calcifications.    12/31/2018 Imaging    CT abdomen/pelvis: IMPRESSION: 1. 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 Abnormal soft tissue associated with both kidneys and surrounding the proximal left ureter is also likely due to lymphoma. This soft tissue associated with the kidneys  and left ureter results in mild bilateral hydronephrosis, left greater than right. 2. Small pleural effusions, mild pulmonary edema, ascites, and fluid and stranding in the mesentery is all likely due to third spacing of fluid. The mesenteric findings surrounds the pancreas but is probably part of the broader process of fluid third-spacing. Recommend clinical correlation to exclude signs of pancreatitis. If there is any concern for pancreatitis, recommend obtaining a lipase. 3. Small pulmonary nodules as above. 4. No pulmonary emboli. 5. Coronary artery calcifications.    01/01/2019 Initial Diagnosis    Diffuse large B-cell lymphoma (HCC)    01/12/2019 Procedure    Left supraclavicular LN biopsy by Dr. Ingram    01/12/2019 Pathology Results    Accession: [id number]  Tissue-Flow Cytometry - MONOCLONAL B-CELL POPULATION WITH EXPRESSION OF CD10 COMPRISES 80% OF ALL LYMPHOCYTES - SEE COMMENT    01/12/2019 Pathology Results    Accession: [id number]  Lymph node for lymphoma, Left supraclavicular - DIFFUSE LARGE B-CELL LYMPHOMA - SEE COMMENT Microscopic Comment The excisional biopsy material is composed diffuse lymphoid proliferation. The lymphocytes are medium to large with vesicular chromatin, inconspicuous to prominent nucleoli, scant cytoplasm and admixed small benign lymphocytes. The atypical cells are B-cells by CD20 immunohistochemistry with expression of CD10, bcl-6, and bcl-2. The proliferative rate is high by ki-67 (70-80%). The neoplastic cells do not express CD5 or EBV by in-situ hybridization. CD3 highlights background T-cells. Flow cytometry performed on the sample identified a kappa-restricted B-cell population with expression of CD10 that comprised 80% of all lymphocytes (See [id number]).    01/15/2019 Imaging      PET: IMPRESSION: 1. Extensive hypermetabolic adenopathy in the cervical lymph nodes, paratracheal nodes, periaortic nodes and iliac nodes. 2. Hypermetabolic  nodal metastasis within the mesentery of the small bowel colon. 3. Extensive skeletal metastasis within the axillary and appendicular skeleton with intense metabolic activity. Expansile soft tissue lesions in the superior aspect of the LEFT scapula and RIGHT sacrum. These expansile soft tissue bone metastasis may be best targets for biopsy. 4. Normal spleen.     02/01/2019 -  Chemotherapy    The patient had DOXOrubicin (ADRIAMYCIN) chemo injection 100 mg, 49.5 mg/m2 = 102 mg, Intravenous,  Once, 1 of 6 cycles Administration: 100 mg (02/01/2019) palonosetron (ALOXI) injection 0.25 mg, 0.25 mg, Intravenous,  Once, 1 of 6 cycles Administration: 0.25 mg (02/01/2019) pegfilgrastim-cbqv (UDENYCA) injection 6 mg, 6 mg, Subcutaneous, Once, 1 of 6 cycles Administration: 6 mg (02/02/2019) vinCRIStine (ONCOVIN) 2 mg in sodium chloride 0.9 % 50 mL chemo infusion, 2 mg, Intravenous,  Once, 1 of 6 cycles Administration: 2 mg (02/01/2019) methotrexate (PF) 12 mg in sodium chloride (PF) 0.9 % INTRATHECAL chemo injection, , Intrathecal,  Once, 1 of 1 cycle riTUXimab (RITUXAN) 800 mg in sodium chloride 0.9 % 250 mL (2.4242 mg/mL) infusion, 375 mg/m2 = 800 mg, Intravenous,  Once, 1 of 6 cycles Administration: 800 mg (02/01/2019) cyclophosphamide (CYTOXAN) 1,540 mg in sodium chloride 0.9 % 250 mL chemo infusion, 750 mg/m2 = 1,540 mg, Intravenous,  Once, 1 of 6 cycles Administration: 1,540 mg (02/01/2019)  for chemotherapy treatment.      REVIEW OF SYSTEMS:   Constitutional: ( - ) fevers, ( - )  chills , ( - ) night sweats Eyes: ( - ) blurriness of vision, ( - ) double vision, ( - ) watery eyes Ears, nose, mouth, throat, and face: ( - ) mucositis, ( - ) sore throat Respiratory: ( - ) cough, ( - ) dyspnea, ( - ) wheezes Cardiovascular: ( - ) palpitation, ( - ) chest discomfort, ( - ) lower extremity swelling Gastrointestinal:  ( - ) nausea, ( - ) heartburn, ( + ) change in bowel habits Skin: ( - ) abnormal  skin rashes Lymphatics: ( - ) new lymphadenopathy, ( - ) easy bruising Neurological: ( - ) numbness, ( - ) tingling, ( - ) new weaknesses Behavioral/Psych: ( - ) mood change, ( - ) new changes  All other systems were reviewed with the patient and are negative.  I have reviewed the past medical history, past surgical history, social history and family history with the patient and they are unchanged from previous note.  ALLERGIES:  is allergic to penicillins.  MEDICATIONS:  Current Outpatient Medications  Medication Sig Dispense Refill  . acetaminophen (TYLENOL) 325 MG tablet Take 975 mg by mouth every 6 (six) hours as needed for mild pain.    Marland Kitchen allopurinol (ZYLOPRIM) 100 MG tablet Take 1 tablet (100 mg total) by mouth daily. 30 tablet 0  . docusate sodium (COLACE) 100 MG capsule Take 100 mg by mouth 2 (two) times daily.    . famciclovir (FAMVIR) 250 MG tablet Take 1 tablet (250 mg total) by mouth daily. 30 tablet 8  . ferrous sulfate 325 (65 FE) MG EC tablet Take 325 mg by mouth daily.     . fluconazole (DIFLUCAN) 100 MG tablet Take 2 tabs on Day 1, and then 1 tab daily x 14 days 16 tablet 0  . HYDROcodone-acetaminophen (NORCO) 5-325 MG tablet Take 1-2 tablets by mouth every 4 (four) hours as  needed for moderate pain or severe pain. 30 tablet 0  . lidocaine-prilocaine (EMLA) cream Apply to affected area once 30 g 3  . LORazepam (ATIVAN) 0.5 MG tablet Take 1 tablet (0.5 mg total) by mouth every 6 (six) hours as needed (Nausea or vomiting). 30 tablet 0  . magic mouthwash w/lidocaine SOLN Take 5 mLs by mouth 4 (four) times daily as needed for mouth pain. 200 mL 5  . morphine (MSIR) 15 MG tablet Take 1 tablet (15 mg total) by mouth every 4 (four) hours as needed for up to 30 days for severe pain. 90 tablet 0  . ondansetron (ZOFRAN) 8 MG tablet Take 1 tablet (8 mg total) by mouth 2 (two) times daily as needed for refractory nausea / vomiting. Start on day 3 after cyclophosphamide chemotherapy. 30  tablet 1  . prochlorperazine (COMPAZINE) 10 MG tablet Take 1 tablet (10 mg total) by mouth every 6 (six) hours as needed (Nausea or vomiting). 30 tablet 6  . traMADol (ULTRAM) 50 MG tablet Take 1 tablet (50 mg total) by mouth every 8 (eight) hours as needed. 90 tablet 0   No current facility-administered medications for this visit.     PHYSICAL EXAMINATION: ECOG PERFORMANCE STATUS: 0 - Asymptomatic  Today's Vitals   02/09/19 1338 02/09/19 1412  BP: 136/73   Pulse: 83   Resp: 17   Temp: 97.9 F (36.6 C)   TempSrc: Oral   SpO2: 100%   Weight: 179 lb 8 oz (81.4 kg)   PainSc:  4    Body mass index is 26.51 kg/m.  Filed Weights   02/09/19 1338  Weight: 179 lb 8 oz (81.4 kg)    GENERAL: alert, no distress and comfortable, well appearing  SKIN: skin color, texture, turgor are normal, no rashes or significant lesions EYES: conjunctiva are pink and non-injected, sclera clear OROPHARYNX: no exudate, no erythema; lips, buccal mucosa, and tongue normal  NECK: supple, non-tender LYMPH:  Shotty bilateral cervical adenopathy LUNGS: clear to auscultation with normal breathing effort HEART: regular rate & rhythm and no murmurs and no lower extremity edema ABDOMEN: soft, non-tender, non-distended, normal bowel sounds Musculoskeletal: no cyanosis of digits and no clubbing  PSYCH: alert & oriented x 3, fluent speech NEURO: no focal motor/sensory deficits  LABORATORY DATA:  I have reviewed the data as listed    Component Value Date/Time   NA 137 02/09/2019 1317   K 4.2 02/09/2019 1317   CL 101 02/09/2019 1317   CO2 29 02/09/2019 1317   GLUCOSE 128 (H) 02/09/2019 1317   BUN 19 02/09/2019 1317   CREATININE 0.79 02/09/2019 1317   CALCIUM 8.5 (L) 02/09/2019 1317   PROT 6.2 (L) 02/09/2019 1317   ALBUMIN 4.0 02/09/2019 1317   AST 8 (L) 02/09/2019 1317   ALT 15 02/09/2019 1317   ALKPHOS 99 02/09/2019 1317   BILITOT 0.3 02/09/2019 1317   GFRNONAA >60 02/09/2019 1317   GFRAA >60  02/09/2019 1317    No results found for: SPEP, UPEP  Lab Results  Component Value Date   WBC 2.2 (L) 02/09/2019   NEUTROABS 1.1 (L) 02/09/2019   HGB 8.3 (L) 02/09/2019   HCT 28.1 (L) 02/09/2019   MCV 75.7 (L) 02/09/2019   PLT 190 02/09/2019      Chemistry      Component Value Date/Time   NA 137 02/09/2019 1317   K 4.2 02/09/2019 1317   CL 101 02/09/2019 1317   CO2 29 02/09/2019 1317   BUN   19 02/09/2019 1317   CREATININE 0.79 02/09/2019 1317      Component Value Date/Time   CALCIUM 8.5 (L) 02/09/2019 1317   ALKPHOS 99 02/09/2019 1317   AST 8 (L) 02/09/2019 1317   ALT 15 02/09/2019 1317   BILITOT 0.3 02/09/2019 1317       RADIOGRAPHIC STUDIES: I have personally reviewed the radiological images as listed below and agreed with the findings in the report. Mr Brain W Wo Contrast  Result Date: 02/03/2019 CLINICAL DATA:  52 y/o M; diffuse large B-cell lymphoma, initial workup staging. EXAM: MRI HEAD WITHOUT AND WITH CONTRAST TECHNIQUE: Multiplanar, multiecho pulse sequences of the brain and surrounding structures were obtained without and with intravenous contrast. CONTRAST:  8 cc Gadavist COMPARISON:  12/31/2018 CT head.  01/15/2019 PET-CT. FINDINGS: Brain: No acute infarction, hemorrhage, hydrocephalus, extra-axial collection or mass lesion. No significant structural or signal abnormality of the brain identified. After administration of intravenous contrast there is NO abnormal enhancement. Vascular: Normal flow voids. Skull and upper cervical spine: T1 hypointense, diffusion hyperintense, heterogeneously enhancing lesions in the left parietal bone, clivus, left mandibular condyle, and visible cervical vertebral bodies compatible with osseous metastatic disease. Sinuses/Orbits: Negative. Other: None. IMPRESSION: 1. No intracranial metastasis identified. 2. Calvarium, skull base, and upper cervical osseous metastasis. Electronically Signed   By: Lance  Furusawa-Stratton M.D.   On:  02/03/2019 03:36   Nm Pet Image Initial (pi) Skull Base To Thigh  Result Date: 01/15/2019 CLINICAL DATA:  Initial treatment strategy for lymphadenopathy. EXAM: NUCLEAR MEDICINE PET SKULL BASE TO THIGH TECHNIQUE: 10.0 mCi F-18 FDG was injected intravenously. Full-ring PET imaging was performed from the skull base to thigh after the radiotracer. CT data was obtained and used for attenuation correction and anatomic localization. Fasting blood glucose: 96 mg/dl COMPARISON:  6 CT 12/31/2018 FINDINGS: Mediastinal blood pool activity: SUV max 1.96 NECK: Bilateral small hypermetabolic level 2 lymph nodes. For example lymph nodes on the RIGHT with SUV max equal 9.5 beneath the sternocleidomastoid muscle. Additional level 1 lymph nodes anterior to the submandibular gland with SUV max equal 7.5. These are relatively small with lymph node on the LEFT measuring 11 mm on image 39. Additionally there is hypermetabolic activity in the posterior aspect of the LEFT maxillary sinus which is favored to be within the bone activity is intense with SUV max equal 11.2 Incidental CT findings: none CHEST: There is a large mass replacing the superior aspect of the LEFT scapula extending to the musculature posterior to the LEFT scapula with SUV max equal 25.6. This lesion large measuring approximately 7 cm x 5.3 cm (image 50/4). No significant axillary hypermetabolic activity. There are hypermetabolic paratracheal lymph nodes with SUV max equal 10.3. Incidental CT findings: No pulmonary parenchymal findings. ABDOMEN/PELVIS: Hypermetabolic retroperitoneal periaortic lymph nodes. Example mass of lymph nodes surrounding the posterior aspect of the abdominal aorta below the renal veins with SUV max equal 16.7. There is hypermetabolic masses within the mesentery of the small bowel and colon. For example in the LEFT upper quadrant mass lesion measures 4.0 cm with SUV max equal 10.6. Lesion adjacent to the transverse colon measures 3.4 cm with SUV  max equal 11.5 Adenopathy extends the pelvis along the internal iliac vessels. For example RIGHT internal iliac adenopathy SUV max equal 12.9. Hypermetabolic thickening in the posterior peritoneal space of the posterior cul-de-sac. Incidental CT findings: none SKELETON: Extensive skeletal metastasis involving the femurs, extensive lesions within the pelvis, spine, and scapula as well as the humeri. Example lesion   in the LEFT sacral ala with SUV max equal 17.4 measures approximately 2.8 cm (image 179/4). Incidental CT findings: none IMPRESSION: 1. Extensive hypermetabolic adenopathy in the cervical lymph nodes, paratracheal nodes, periaortic nodes and iliac nodes. 2. Hypermetabolic nodal metastasis within the mesentery of the small bowel colon. 3. Extensive skeletal metastasis within the axillary and appendicular skeleton with intense metabolic activity. Expansile soft tissue lesions in the superior aspect of the LEFT scapula and RIGHT sacrum. These expansile soft tissue bone metastasis may be best targets for biopsy. 4. Normal spleen. Electronically Signed   By: Stewart  Edmunds M.D.   On: 01/15/2019 15:27   Dg Chest Port 1 View  Result Date: 01/23/2019 CLINICAL DATA:  Status post Port-A-Cath placement. EXAM: PORTABLE CHEST 1 VIEW COMPARISON:  Radiographs of December 31, 2018. FINDINGS: The heart size and mediastinal contours are within normal limits. Both lungs are clear. Right subclavian Port-A-Cath is noted with distal tip in expected position of the SVC. No pneumothorax or pleural effusion is noted. The visualized skeletal structures are unremarkable. IMPRESSION: Status post right subclavian Port-A-Cath placement. No acute cardiopulmonary abnormality seen. Electronically Signed   By: James  Green Jr, M.D.   On: 01/23/2019 13:36   Dg Fluoro Guide Cv Line-no Report  Result Date: 01/23/2019 Fluoroscopy was utilized by the requesting physician.  No radiographic interpretation.   Dg Fluoro Guided Loc Of  Needle/cath Tip For Spinal Inject Lt  Result Date: 02/05/2019 CLINICAL DATA:  Lumbar puncture and intrathecal chemo administration. EXAM: FLUOROSCOPICALLY GUIDED LUMBAR PUNCTURE FOR INTRATHECAL CHEMOTHERAPY FLUOROSCOPY TIME:  0.2 minutes PROCEDURE: Informed consent was obtained from the patient prior to the procedure, including potential complications of headache, allergy, and pain. With the patient prone, the lower back was prepped with Betadine. 1% Lidocaine was used for local anesthesia. Lumbar puncture was performed at the L3-4 level using a gauge needle with return of clearCSF. 10 cc of clear CSF was obtained and sent for labs. 5 mL of methotrexate solution was injected into the subarachnoid space. The patient tolerated the procedure well without apparent complication. IMPRESSION: Lumbar puncture and intrathecal chemo administration as above. Electronically Signed   By: David  Williams III M.D   On: 02/05/2019 12:06   

## 2019-02-13 ENCOUNTER — Other Ambulatory Visit: Payer: Self-pay | Admitting: Hematology

## 2019-02-13 ENCOUNTER — Ambulatory Visit (HOSPITAL_BASED_OUTPATIENT_CLINIC_OR_DEPARTMENT_OTHER)
Admission: RE | Admit: 2019-02-13 | Discharge: 2019-02-13 | Disposition: A | Discharge status: Home / Self Care | Payer: BC Managed Care – PPO | Source: Ambulatory Visit | Attending: Hematology | Admitting: Hematology

## 2019-02-13 ENCOUNTER — Telehealth: Payer: Self-pay | Admitting: *Deleted

## 2019-02-13 ENCOUNTER — Telehealth: Payer: Self-pay | Admitting: Hematology

## 2019-02-13 DIAGNOSIS — M7989 Other specified soft tissue disorders: Secondary | ICD-10-CM

## 2019-02-13 NOTE — Progress Notes (Unsigned)
o

## 2019-02-13 NOTE — Telephone Encounter (Signed)
I called and spoke with patient regarding appointment for [place] that will be scheduled for this eveing here @ [facility]. Patient is to check in through ED per Kathlene November in Imaging Department.  Call report to Dr Maylon Peppers and patient will wait for directions from Dr Maylon Peppers.

## 2019-02-13 NOTE — Telephone Encounter (Signed)
Message received from patient stating that his "right leg is considerably more swollen than his left leg" and would like a call back.  Dr. Maylon Peppers notified and order received for pt to have a doppler done today.  Call placed back to patient and patient notified that Dr. Maylon Peppers would like a doppler done today and that scheduling would be calling him to make this appt.  Patient appreciative of call back and states that he is available to do doppler today.

## 2019-02-14 ENCOUNTER — Telehealth: Payer: Self-pay | Admitting: *Deleted

## 2019-02-14 ENCOUNTER — Telehealth: Payer: Self-pay | Admitting: Hematology

## 2019-02-14 NOTE — Telephone Encounter (Signed)
Return call received from patient and pt notified per order of Dr. Maylon Peppers that there is no DVT in his legs and that Dr. Maylon Peppers would like for pt to come in tomorrow, 02/15/19 at 0930 for labs and to see Dr. Maylon Peppers.

## 2019-02-14 NOTE — Telephone Encounter (Signed)
LMVM regarding appointment for lab that has been added to appointment per 3/4 staff message

## 2019-02-15 ENCOUNTER — Ambulatory Visit (HOSPITAL_BASED_OUTPATIENT_CLINIC_OR_DEPARTMENT_OTHER)
Admission: RE | Admit: 2019-02-15 | Discharge: 2019-02-15 | Disposition: A | Discharge status: Home / Self Care | Payer: BC Managed Care – PPO | Source: Ambulatory Visit | Attending: Hematology | Admitting: Hematology

## 2019-02-15 ENCOUNTER — Encounter (HOSPITAL_BASED_OUTPATIENT_CLINIC_OR_DEPARTMENT_OTHER): Payer: Self-pay

## 2019-02-15 ENCOUNTER — Inpatient Hospital Stay: Payer: BC Managed Care – PPO

## 2019-02-15 ENCOUNTER — Telehealth: Payer: Self-pay | Admitting: *Deleted

## 2019-02-15 ENCOUNTER — Encounter: Payer: Self-pay | Admitting: Hematology

## 2019-02-15 ENCOUNTER — Inpatient Hospital Stay: Payer: BC Managed Care – PPO | Attending: Hematology | Admitting: Hematology

## 2019-02-15 VITALS — BP 133/75 | HR 69 | Temp 98.8°F | Resp 19 | Ht 69.0 in | Wt 186.2 lb

## 2019-02-15 DIAGNOSIS — C8338 Diffuse large B-cell lymphoma, lymph nodes of multiple sites: Secondary | ICD-10-CM

## 2019-02-15 DIAGNOSIS — M7989 Other specified soft tissue disorders: Secondary | ICD-10-CM | POA: Insufficient documentation

## 2019-02-15 DIAGNOSIS — T451X5A Adverse effect of antineoplastic and immunosuppressive drugs, initial encounter: Secondary | ICD-10-CM

## 2019-02-15 DIAGNOSIS — Z5111 Encounter for antineoplastic chemotherapy: Secondary | ICD-10-CM | POA: Insufficient documentation

## 2019-02-15 DIAGNOSIS — D6481 Anemia due to antineoplastic chemotherapy: Secondary | ICD-10-CM | POA: Insufficient documentation

## 2019-02-15 DIAGNOSIS — Z5189 Encounter for other specified aftercare: Secondary | ICD-10-CM | POA: Insufficient documentation

## 2019-02-15 DIAGNOSIS — C8330 Diffuse large B-cell lymphoma, unspecified site: Secondary | ICD-10-CM | POA: Diagnosis not present

## 2019-02-15 DIAGNOSIS — D701 Agranulocytosis secondary to cancer chemotherapy: Secondary | ICD-10-CM | POA: Diagnosis not present

## 2019-02-15 LAB — CBC WITH DIFFERENTIAL (CANCER CENTER ONLY)
Abs Immature Granulocytes: 0.04 10*3/uL (ref 0.00–0.07)
Basophils Absolute: 0 10*3/uL (ref 0.0–0.1)
Basophils Relative: 1 %
EOS ABS: 0.2 10*3/uL (ref 0.0–0.5)
EOS PCT: 5 %
HCT: 29.7 % — ABNORMAL LOW (ref 39.0–52.0)
Hemoglobin: 8.9 g/dL — ABNORMAL LOW (ref 13.0–17.0)
Immature Granulocytes: 1 %
Lymphocytes Relative: 18 %
Lymphs Abs: 0.6 10*3/uL — ABNORMAL LOW (ref 0.7–4.0)
MCH: 23.7 pg — ABNORMAL LOW (ref 26.0–34.0)
MCHC: 30 g/dL (ref 30.0–36.0)
MCV: 79 fL — ABNORMAL LOW (ref 80.0–100.0)
Monocytes Absolute: 0.4 10*3/uL (ref 0.1–1.0)
Monocytes Relative: 10 %
Neutro Abs: 2.2 10*3/uL (ref 1.7–7.7)
Neutrophils Relative %: 65 %
Platelet Count: 168 10*3/uL (ref 150–400)
RBC: 3.76 MIL/uL — ABNORMAL LOW (ref 4.22–5.81)
RDW: 22.9 % — ABNORMAL HIGH (ref 11.5–15.5)
WBC Count: 3.5 10*3/uL — ABNORMAL LOW (ref 4.0–10.5)
nRBC: 0 % (ref 0.0–0.2)

## 2019-02-15 LAB — CMP (CANCER CENTER ONLY)
ALT: 37 U/L (ref 0–44)
AST: 20 U/L (ref 15–41)
Albumin: 4.1 g/dL (ref 3.5–5.0)
Alkaline Phosphatase: 102 U/L (ref 38–126)
Anion gap: 7 (ref 5–15)
BUN: 16 mg/dL (ref 6–20)
CO2: 29 mmol/L (ref 22–32)
Calcium: 8.6 mg/dL — ABNORMAL LOW (ref 8.9–10.3)
Chloride: 103 mmol/L (ref 98–111)
Creatinine: 0.88 mg/dL (ref 0.61–1.24)
GFR, Est AFR Am: >60 mL/min (ref >60)
GFR, Estimated: >60 mL/min (ref >60)
Glucose, Bld: 131 mg/dL — ABNORMAL HIGH (ref 70–99)
Potassium: 4.1 mmol/L (ref 3.5–5.1)
Sodium: 139 mmol/L (ref 135–145)
Total Bilirubin: 0.3 mg/dL (ref 0.3–1.2)
Total Protein: 6.1 g/dL — ABNORMAL LOW (ref 6.5–8.1)

## 2019-02-15 MED ORDER — HEPARIN SOD (PORK) LOCK FLUSH 100 UNIT/ML IV SOLN
500.0000 [IU] | Freq: Once | INTRAVENOUS | Status: AC
  Administered 2019-02-15: 500 [IU] via INTRAVENOUS
  Filled 2019-02-15: qty 5

## 2019-02-15 MED ORDER — SODIUM CHLORIDE 0.9% FLUSH
10.0000 mL | INTRAVENOUS | Status: DC | PRN
  Administered 2019-02-15: 10 mL via INTRAVENOUS
  Filled 2019-02-15: qty 10

## 2019-02-15 MED ORDER — IOHEXOL 300 MG/ML  SOLN
100.0000 mL | Freq: Once | INTRAMUSCULAR | Status: AC | PRN
  Administered 2019-02-15: 100 mL via INTRAVENOUS

## 2019-02-15 NOTE — Telephone Encounter (Signed)
-----   Message from Tish Men, MD sent at 02/15/2019  3:11 PM EST ----- Can we call Paul Mason and let him know that CT did not show any venous obstruction? I suspect that the right leg swelling may just be some poor blood flow. He should keep the right leg elevated, and use compression stocking as discussed. The lymphoma seems a little better.   Thanks.  [place] ----- Message ----- From: Interface, Rad Results In Sent: 02/15/2019   1:49 PM EST To: Tish Men, MD

## 2019-02-15 NOTE — Telephone Encounter (Signed)
Per MD, notified pt  Lymphoma seems a little better,CT did not show any venous obstruction. MD suspects that the right leg swelling may be poor blood flow. Instructed pt to keep right leg elevated and use compression stocking as previously discussed.  Pt verbalized understanding. No further concerns.

## 2019-02-15 NOTE — Progress Notes (Signed)
[facility] OFFICE PROGRESS NOTE  Patient Care Team: Maury Dus, MD as PCP - General (Family Medicine)  HEME/ONC OVERVIEW: 1. 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, GCB subtype; poor prognosis by R-IPI; intermediate risk by CNS-IPI  -12/2018: CT showed cervical lymphadenopathy (left supraclavicular LN ~3 x 1.5cm), abnormal soft tissue masses surrounding bilateral kidneys (R 4.0 x 3.2cm; L 3 x 2.5cm) encasing the ureters, RP adenopathy (largest 3.4cm), and R internal iliac lymphadenopathy (conglomerate 4.5 x 2.5cm) -01/2019:   PET showed extensive FDG-avid adenopathy in the cervical, paratracheal, peri-aortic, mesenteric and iliac LN's, as well as extensive skeletal involvement throughout the axial and appendicular skeleton; expansile soft tissue lesions in the left scapula and right sacrum  L supraclavicular LN bx showed DLBCL, Ki-67 70-80%, GCB subtype; FISH positive for Bcl-2 rearrangement (41.5%) but negative for Bcl-6 and Myc rearrangement   Normal LVEF on TTE   MRI brain negative for any CNS involvement -Mid-01/2019: R-CHOP with Udenyca, plan for 6 cycles   2. Port placement in 01/2019   TREATMENT REGIMEN:  02/01/2019 - present: R-CHOP with Udenyca, plan for 6 cycles  02/05/2019: IT MTX x 1 for CNS prophylaxis; evaluation for HD MTX at [facility] pending   ASSESSMENT & PLAN:   R lower extremity swelling -New onset since 02/13/2019, associated pain in the right ankle and slightly worsening chronic right hip pain -Exam notable for unilateral RLE swelling, pulse intact  -Doppler of the B/L lower extremities negative for DVT -I reviewed the patient's recent scans, which showed large nodal conglomerate along the internal iliac vesels -Given the extensive swelling in the right lower extremity, I have ordered CT abdomen/pelvis w/ contrast to rule out any proximal venous obstruction   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 Cassell Clement of hematology at [facility], who plans for inpatient HD MTX on Day 14 with Cycles 2, 4 and 6 of R-CHOP; okay with Neulasta following R-CHOP -We will plan to restage after 3 cycles of R-CHOP to assess interim response -Ppx: allopurinol, famciclovir -PRN anti-emetics: Zofran, Compazine, and Ativan  CNS prophylaxis -HD MTX planned with Cycles 2, 4 and 6 of R-CHOP on Day 14; to be administered inpatient at [facility]  -I appreciate assistance from Dr. Cassell Clement   Chemotherapy-associated anemia -Secondary to chemotherapy and bone marrow involvement by lymphoma -Hgb 8.9 today, stable -Patient denies any symptoms of bleeding -We will monitor for now; no indication for dose adjustment  Chemotherapy-associated leukopenia -Secondary to chemotherapy -WBC 3.5k with ANC 2200, improving -Patient denies any symptoms of infection -We will monitor for now; no indication for dose adjustment  No orders of the defined types were placed in this encounter.  All questions were answered. The patient knows to call the clinic with any problems, questions or concerns. No barriers to learning was detected.  Return on 02/22/2019 for labs, port flush, clinic appt and Cycle 2 of R-CHOP.   Tish Men, MD 02/15/2019 9:47 AM  CHIEF COMPLAINT: "My right leg is still swollen "  INTERVAL HISTORY: Paul Mason returns to clinic for evaluation of new onset the right lower extremity swelling.  Patient reports that starting on Tuesday, he noticed new onset swelling in the right lower extremity from the foot all the way to the thigh, associated with a throbbing pain in the right ankle as well as slightly worsening chronic right hip pain.  He denies  any chest pain, dyspnea, hemoptysis, or left lower extremity pain/swelling.  He had Doppler of the right lower extremity on 02/13/2019, which was negative for DVT.  He otherwise denies any other complaint  today.  SUMMARY OF ONCOLOGIC HISTORY:   Diffuse large B-cell lymphoma ([place])   12/31/2018 Imaging    CT neck: IMPRESSION: Enlarged cervical lymph nodes in the neck as above. Most prominent nodes are in the left supraclavicular region. This may be due to metastatic disease or lymphoma. No primary head and neck cancer identified.    12/31/2018 Imaging    CTA chest: IMPRESSION: 1. 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 Abnormal soft tissue associated with both kidneys and surrounding the proximal left ureter is also likely due to lymphoma. This soft tissue associated with the kidneys and left ureter results in mild bilateral hydronephrosis, left greater than right. 2. Small pleural effusions, mild pulmonary edema, ascites, and fluid and stranding in the mesentery is all likely due to third spacing of fluid. The mesenteric findings surrounds the pancreas but is probably part of the broader process of fluid third-spacing. Recommend clinical correlation to exclude signs of pancreatitis. If there is any concern for pancreatitis, recommend obtaining a lipase. 3. Small pulmonary nodules as above. 4. No pulmonary emboli. 5. Coronary artery calcifications.    12/31/2018 Imaging    CT abdomen/pelvis: IMPRESSION: 1. 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 Abnormal soft tissue associated with both kidneys and surrounding the proximal left ureter is also likely due to lymphoma. This soft tissue associated with the kidneys and left ureter results in mild bilateral hydronephrosis, left greater than right. 2. Small pleural effusions, mild pulmonary edema, ascites, and fluid and stranding in the mesentery is all likely due to third spacing of fluid. The mesenteric findings surrounds the pancreas but is probably part of the broader process of fluid  third-spacing. Recommend clinical correlation to exclude signs of pancreatitis. If there is any concern for pancreatitis, recommend obtaining a lipase. 3. Small pulmonary nodules as above. 4. No pulmonary emboli. 5. Coronary artery calcifications.    01/01/2019 Initial Diagnosis    Diffuse large B-cell lymphoma ([place])    01/12/2019 Procedure    Left supraclavicular LN biopsy by Dr. Dalbert Batman    01/12/2019 Pathology Results    Accession: [id number]  Tissue-Flow Cytometry - MONOCLONAL B-CELL POPULATION WITH EXPRESSION OF CD10 COMPRISES 80% OF ALL LYMPHOCYTES - SEE COMMENT    01/12/2019 Pathology Results    Accession: [id number]  Lymph node for lymphoma, Left supraclavicular - DIFFUSE LARGE B-CELL LYMPHOMA - SEE COMMENT Microscopic Comment The excisional biopsy material is composed diffuse lymphoid proliferation. The lymphocytes are medium to large with vesicular chromatin, inconspicuous to prominent nucleoli, scant cytoplasm and admixed small benign lymphocytes. The atypical cells are B-cells by CD20 immunohistochemistry with expression of CD10, bcl-6, and bcl-2. The proliferative rate is high by ki-67 (70-80%). The neoplastic cells do not express CD5 or EBV by in-situ hybridization. CD3 highlights background T-cells. Flow cytometry performed on the sample identified a kappa-restricted B-cell population with expression of CD10 that comprised 80% of all lymphocytes (See [id number]).    01/15/2019 Imaging    PET: IMPRESSION: 1. Extensive hypermetabolic adenopathy in the cervical lymph nodes, paratracheal nodes, periaortic nodes and iliac nodes. 2. Hypermetabolic nodal metastasis within the mesentery of the small bowel colon. 3. Extensive skeletal metastasis within the axillary and appendicular skeleton with intense  metabolic activity. Expansile soft tissue lesions in the superior aspect of the LEFT scapula and RIGHT sacrum. These expansile soft tissue bone metastasis may be best  targets for biopsy. 4. Normal spleen.     02/01/2019 -  Chemotherapy    The patient had DOXOrubicin (ADRIAMYCIN) chemo injection 100 mg, 49.5 mg/m2 = 102 mg, Intravenous,  Once, 1 of 6 cycles Administration: 100 mg (02/01/2019) palonosetron (ALOXI) injection 0.25 mg, 0.25 mg, Intravenous,  Once, 1 of 6 cycles Administration: 0.25 mg (02/01/2019) pegfilgrastim-cbqv (UDENYCA) injection 6 mg, 6 mg, Subcutaneous, Once, 1 of 6 cycles Administration: 6 mg (02/02/2019) vinCRIStine (ONCOVIN) 2 mg in sodium chloride 0.9 % 50 mL chemo infusion, 2 mg, Intravenous,  Once, 1 of 6 cycles Administration: 2 mg (02/01/2019) methotrexate (PF) 12 mg in sodium chloride (PF) 0.9 % INTRATHECAL chemo injection, , Intrathecal,  Once, 1 of 1 cycle riTUXimab (RITUXAN) 800 mg in sodium chloride 0.9 % 250 mL (2.4242 mg/mL) infusion, 375 mg/m2 = 800 mg, Intravenous,  Once, 1 of 6 cycles Administration: 800 mg (02/01/2019) cyclophosphamide (CYTOXAN) 1,540 mg in sodium chloride 0.9 % 250 mL chemo infusion, 750 mg/m2 = 1,540 mg, Intravenous,  Once, 1 of 6 cycles Administration: 1,540 mg (02/01/2019)  for chemotherapy treatment.      REVIEW OF SYSTEMS:   Constitutional: ( - ) fevers, ( - )  chills , ( - ) night sweats Eyes: ( - ) blurriness of vision, ( - ) double vision, ( - ) watery eyes Ears, nose, mouth, throat, and face: ( - ) mucositis, ( - ) sore throat Respiratory: ( - ) cough, ( - ) dyspnea, ( - ) wheezes Cardiovascular: ( - ) palpitation, ( - ) chest discomfort, ( + ) lower extremity swelling Gastrointestinal:  ( - ) nausea, ( - ) heartburn, ( - ) change in bowel habits Skin: ( - ) abnormal skin rashes Lymphatics: ( - ) new lymphadenopathy, ( - ) easy bruising Neurological: ( - ) numbness, ( - ) tingling, ( - ) new weaknesses Behavioral/Psych: ( - ) mood change, ( - ) new changes  All other systems were reviewed with the patient and are negative.  I have reviewed the past medical history, past surgical  history, social history and family history with the patient and they are unchanged from previous note.  ALLERGIES:  is allergic to penicillins.  MEDICATIONS:  Current Outpatient Medications  Medication Sig Dispense Refill  . acetaminophen (TYLENOL) 325 MG tablet Take 975 mg by mouth every 6 (six) hours as needed for mild pain.    Marland Kitchen allopurinol (ZYLOPRIM) 100 MG tablet Take 1 tablet (100 mg total) by mouth daily. 30 tablet 0  . docusate sodium (COLACE) 100 MG capsule Take 100 mg by mouth 2 (two) times daily.    . famciclovir (FAMVIR) 250 MG tablet Take 1 tablet (250 mg total) by mouth daily. 30 tablet 8  . ferrous sulfate 325 (65 FE) MG EC tablet Take 325 mg by mouth daily.     . fluconazole (DIFLUCAN) 100 MG tablet Take 2 tabs on Day 1, and then 1 tab daily x 14 days 16 tablet 0  . HYDROcodone-acetaminophen (NORCO) 5-325 MG tablet Take 1-2 tablets by mouth every 4 (four) hours as needed for moderate pain or severe pain. 30 tablet 0  . lidocaine-prilocaine (EMLA) cream Apply to affected area once 30 g 3  . LORazepam (ATIVAN) 0.5 MG tablet Take 1 tablet (0.5 mg total) by mouth every 6 (six) hours  as needed (Nausea or vomiting). 30 tablet 0  . magic mouthwash w/lidocaine SOLN Take 5 mLs by mouth 4 (four) times daily as needed for mouth pain. 200 mL 5  . morphine (MSIR) 15 MG tablet Take 1 tablet (15 mg total) by mouth every 4 (four) hours as needed for up to 30 days for severe pain. 90 tablet 0  . ondansetron (ZOFRAN) 8 MG tablet Take 1 tablet (8 mg total) by mouth 2 (two) times daily as needed for refractory nausea / vomiting. Start on day 3 after cyclophosphamide chemotherapy. 30 tablet 1  . prochlorperazine (COMPAZINE) 10 MG tablet Take 1 tablet (10 mg total) by mouth every 6 (six) hours as needed (Nausea or vomiting). 30 tablet 6  . traMADol (ULTRAM) 50 MG tablet Take 1 tablet (50 mg total) by mouth every 8 (eight) hours as needed. 90 tablet 0   No current facility-administered medications for  this visit.    Facility-Administered Medications Ordered in Other Visits  Medication Dose Route Frequency Provider Last Rate Last Dose  . heparin lock flush 100 unit/mL  500 Units Intravenous Once Tish Men, MD      . sodium chloride flush (NS) 0.9 % injection 10 mL  10 mL Intravenous PRN Tish Men, MD        PHYSICAL EXAMINATION: ECOG PERFORMANCE STATUS: 1 - Symptomatic but completely ambulatory  Today's Vitals   02/15/19 0939  BP: 133/75  Pulse: 69  Resp: 19  Temp: 98.8 F (37.1 C)  TempSrc: Oral  SpO2: 100%  Weight: 186 lb 4 oz (84.5 kg)  Height: '5\' 9"'$  (1.753 m)   Body mass index is 27.5 kg/m.  Filed Weights   02/15/19 0939  Weight: 186 lb 4 oz (84.5 kg)    GENERAL: alert, no distress and comfortable SKIN: skin color, texture, turgor are normal, no rashes or significant lesions EYES: conjunctiva are pink and non-injected, sclera clear OROPHARYNX: no exudate, no erythema; lips, buccal mucosa, and tongue normal  NECK: supple, non-tender LUNGS: clear to auscultation with normal breathing effort HEART: regular rate & rhythm and no murmurs, 1-2+ right lower extremity edema ABDOMEN: soft, non-tender, non-distended, normal bowel sounds Musculoskeletal: no cyanosis of digits and no clubbing  PSYCH: alert & oriented x 3, fluent speech NEURO: no focal motor/sensory deficits  LABORATORY DATA:  I have reviewed the data as listed    Component Value Date/Time   NA 137 02/09/2019 1317   K 4.2 02/09/2019 1317   CL 101 02/09/2019 1317   CO2 29 02/09/2019 1317   GLUCOSE 128 (H) 02/09/2019 1317   BUN 19 02/09/2019 1317   CREATININE 0.79 02/09/2019 1317   CALCIUM 8.5 (L) 02/09/2019 1317   PROT 6.2 (L) 02/09/2019 1317   ALBUMIN 4.0 02/09/2019 1317   AST 8 (L) 02/09/2019 1317   ALT 15 02/09/2019 1317   ALKPHOS 99 02/09/2019 1317   BILITOT 0.3 02/09/2019 1317   GFRNONAA >60 02/09/2019 1317   GFRAA >60 02/09/2019 1317    No results found for: SPEP, UPEP  Lab Results   Component Value Date   WBC 2.2 (L) 02/09/2019   NEUTROABS 1.1 (L) 02/09/2019   HGB 8.3 (L) 02/09/2019   HCT 28.1 (L) 02/09/2019   MCV 75.7 (L) 02/09/2019   PLT 190 02/09/2019      Chemistry      Component Value Date/Time   NA 137 02/09/2019 1317   K 4.2 02/09/2019 1317   CL 101 02/09/2019 1317   CO2 29 02/09/2019  1317   BUN 19 02/09/2019 1317   CREATININE 0.79 02/09/2019 1317      Component Value Date/Time   CALCIUM 8.5 (L) 02/09/2019 1317   ALKPHOS 99 02/09/2019 1317   AST 8 (L) 02/09/2019 1317   ALT 15 02/09/2019 1317   BILITOT 0.3 02/09/2019 1317       RADIOGRAPHIC STUDIES: I have personally reviewed the radiological images as listed below and agreed with the findings in the report. Paul Mason Wo Contrast  Result Date: 02/03/2019 CLINICAL DATA:  53 y/o M; diffuse large B-cell lymphoma, initial workup staging. EXAM: MRI HEAD WITHOUT AND WITH CONTRAST TECHNIQUE: Multiplanar, multiecho pulse sequences of the brain and surrounding structures were obtained without and with intravenous contrast. CONTRAST:  8 cc Gadavist COMPARISON:  12/31/2018 CT head.  01/15/2019 PET-CT. FINDINGS: Brain: No acute infarction, hemorrhage, hydrocephalus, extra-axial collection or mass lesion. No significant structural or signal abnormality of the brain identified. After administration of intravenous contrast there is NO abnormal enhancement. Vascular: Normal flow voids. Skull and upper cervical spine: T1 hypointense, diffusion hyperintense, heterogeneously enhancing lesions in the left parietal bone, clivus, left mandibular condyle, and visible cervical vertebral bodies compatible with osseous metastatic disease. Sinuses/Orbits: Negative. Other: None. IMPRESSION: 1. No intracranial metastasis identified. 2. Calvarium, skull base, and upper cervical osseous metastasis. Electronically Signed   By: Kristine Garbe M.D.   On: 02/03/2019 03:36   US Venous Img Lower Bilateral  Result Date:  02/13/2019 CLINICAL DATA:  Lower extremity edema EXAM: BILATERAL LOWER EXTREMITY VENOUS DOPPLER ULTRASOUND TECHNIQUE: Gray-scale sonography with graded compression, as well as color Doppler and duplex ultrasound were performed to evaluate the lower extremity deep venous systems from the level of the common femoral vein and including the common femoral, femoral, profunda femoral, popliteal and calf veins including the posterior tibial, peroneal and gastrocnemius veins when visible. The superficial great saphenous vein was also interrogated. Spectral Doppler was utilized to evaluate flow at rest and with distal augmentation maneuvers in the common femoral, femoral and popliteal veins. COMPARISON:  None. FINDINGS: RIGHT LOWER EXTREMITY Common Femoral Vein: No evidence of thrombus. Normal compressibility, respiratory phasicity and response to augmentation. Saphenofemoral Junction: No evidence of thrombus. Normal compressibility and flow on color Doppler imaging. Profunda Femoral Vein: No evidence of thrombus. Normal compressibility and flow on color Doppler imaging. Femoral Vein: No evidence of thrombus. Normal compressibility, respiratory phasicity and response to augmentation. Popliteal Vein: No evidence of thrombus. Normal compressibility, respiratory phasicity and response to augmentation. Calf Veins: No evidence of thrombus. Normal compressibility and flow on color Doppler imaging. Superficial Great Saphenous Vein: No evidence of thrombus. Normal compressibility. LEFT LOWER EXTREMITY Common Femoral Vein: No evidence of thrombus. Normal compressibility, respiratory phasicity and response to augmentation. Saphenofemoral Junction: No evidence of thrombus. Normal compressibility and flow on color Doppler imaging. Profunda Femoral Vein: No evidence of thrombus. Normal compressibility and flow on color Doppler imaging. Femoral Vein: No evidence of thrombus. Normal compressibility, respiratory phasicity and response to  augmentation. Popliteal Vein: No evidence of thrombus. Normal compressibility, respiratory phasicity and response to augmentation. Calf Veins: No evidence of thrombus. Normal compressibility and flow on color Doppler imaging. Superficial Great Saphenous Vein: No evidence of thrombus. Normal compressibility. IMPRESSION: No evidence of deep venous thrombosis. Electronically Signed   By: Donavan Foil M.D.   On: 02/13/2019 19:49   Dg Chest Port 1 View  Result Date: 01/23/2019 CLINICAL DATA:  Status post Port-A-Cath placement. EXAM: PORTABLE CHEST 1 VIEW COMPARISON:  Radiographs of December 31, 2018. FINDINGS: The heart size and mediastinal contours are within normal limits. Both lungs are clear. Right subclavian Port-A-Cath is noted with distal tip in expected position of the SVC. No pneumothorax or pleural effusion is noted. The visualized skeletal structures are unremarkable. IMPRESSION: Status post right subclavian Port-A-Cath placement. No acute cardiopulmonary abnormality seen. Electronically Signed   By: Marijo Conception, M.D.   On: 01/23/2019 13:36   Dg Fluoro Guide Cv Line-no Report  Result Date: 01/23/2019 Fluoroscopy was utilized by the requesting physician.  No radiographic interpretation.   Dg Fluoro Guided Loc Of Needle/cath Tip For Spinal Inject Lt  Result Date: 02/05/2019 CLINICAL DATA:  Lumbar puncture and intrathecal chemo administration. EXAM: FLUOROSCOPICALLY GUIDED LUMBAR PUNCTURE FOR INTRATHECAL CHEMOTHERAPY FLUOROSCOPY TIME:  0.2 minutes PROCEDURE: Informed consent was obtained from the patient prior to the procedure, including potential complications of headache, allergy, and pain. With the patient prone, the lower back was prepped with Betadine. 1% Lidocaine was used for local anesthesia. Lumbar puncture was performed at the L3-4 level using a gauge needle with return of clearCSF. 10 cc of clear CSF was obtained and sent for labs. 5 mL of methotrexate solution was injected into the  subarachnoid space. The patient tolerated the procedure well without apparent complication. IMPRESSION: Lumbar puncture and intrathecal chemo administration as above. Electronically Signed   By: Dorise Bullion III M.D   On: 02/05/2019 12:06

## 2019-02-15 NOTE — Patient Instructions (Signed)
Implanted Port Insertion, Care After  This sheet gives you information about how to care for yourself after your procedure. Your health care provider may also give you more specific instructions. If you have problems or questions, contact your health care provider.  What can I expect after the procedure?  After the procedure, it is common to have:  · Discomfort at the port insertion site.  · Bruising on the skin over the port. This should improve over 3-4 days.  Follow these instructions at home:  Port care  · After your port is placed, you will get a manufacturer's information card. The card has information about your port. Keep this card with you at all times.  · Take care of the port as told by your health care provider. Ask your health care provider if you or a family member can get training for taking care of the port at home. A home health care nurse may also take care of the port.  · Make sure to remember what type of port you have.  Incision care         · Follow instructions from your health care provider about how to take care of your port insertion site. Make sure you:  ? Wash your hands with soap and water before and after you change your bandage (dressing). If soap and water are not available, use hand sanitizer.  ? Change your dressing as told by your health care provider.  ? Leave stitches (sutures), skin glue, or adhesive strips in place. These skin closures may need to stay in place for 2 weeks or longer. If adhesive strip edges start to loosen and curl up, you may trim the loose edges. Do not remove adhesive strips completely unless your health care provider tells you to do that.  · Check your port insertion site every day for signs of infection. Check for:  ? Redness, swelling, or pain.  ? Fluid or blood.  ? Warmth.  ? Pus or a bad smell.  Activity  · Return to your normal activities as told by your health care provider. Ask your health care provider what activities are safe for you.  · Do not  lift anything that is heavier than 10 lb (4.5 kg), or the limit that you are told, until your health care provider says that it is safe.  General instructions  · Take over-the-counter and prescription medicines only as told by your health care provider.  · Do not take baths, swim, or use a hot tub until your health care provider approves. Ask your health care provider if you may take showers. You may only be allowed to take sponge baths.  · Do not drive for 24 hours if you were given a sedative during your procedure.  · Wear a medical alert bracelet in case of an emergency. This will tell any health care providers that you have a port.  · Keep all follow-up visits as told by your health care provider. This is important.  Contact a health care provider if:  · You cannot flush your port with saline as directed, or you cannot draw blood from the port.  · You have a fever or chills.  · You have redness, swelling, or pain around your port insertion site.  · You have fluid or blood coming from your port insertion site.  · Your port insertion site feels warm to the touch.  · You have pus or a bad smell coming from the port   insertion site.  Get help right away if:  · You have chest pain or shortness of breath.  · You have bleeding from your port that you cannot control.  Summary  · Take care of the port as told by your health care provider. Keep the manufacturer's information card with you at all times.  · Change your dressing as told by your health care provider.  · Contact a health care provider if you have a fever or chills or if you have redness, swelling, or pain around your port insertion site.  · Keep all follow-up visits as told by your health care provider.  This information is not intended to replace advice given to you by your health care provider. Make sure you discuss any questions you have with your health care provider.  Document Released: 09/19/2013 Document Revised: 06/27/2018 Document Reviewed:  06/27/2018  [employer] Interactive Patient Education © 2019 [employer]

## 2019-02-22 ENCOUNTER — Telehealth: Payer: Self-pay | Admitting: Hematology

## 2019-02-22 ENCOUNTER — Inpatient Hospital Stay: Payer: BC Managed Care – PPO

## 2019-02-22 ENCOUNTER — Other Ambulatory Visit: Payer: Self-pay

## 2019-02-22 ENCOUNTER — Inpatient Hospital Stay (HOSPITAL_BASED_OUTPATIENT_CLINIC_OR_DEPARTMENT_OTHER): Payer: BC Managed Care – PPO | Admitting: Hematology

## 2019-02-22 ENCOUNTER — Encounter: Payer: Self-pay | Admitting: Hematology

## 2019-02-22 VITALS — BP 157/75 | HR 81 | Temp 98.1°F | Resp 18 | Ht 69.0 in | Wt 188.8 lb

## 2019-02-22 VITALS — BP 146/80 | HR 76 | Temp 98.1°F | Resp 17

## 2019-02-22 DIAGNOSIS — B370 Candidal stomatitis: Secondary | ICD-10-CM | POA: Insufficient documentation

## 2019-02-22 DIAGNOSIS — D6481 Anemia due to antineoplastic chemotherapy: Secondary | ICD-10-CM

## 2019-02-22 DIAGNOSIS — K521 Toxic gastroenteritis and colitis: Secondary | ICD-10-CM

## 2019-02-22 DIAGNOSIS — M7989 Other specified soft tissue disorders: Secondary | ICD-10-CM

## 2019-02-22 DIAGNOSIS — C8338 Diffuse large B-cell lymphoma, lymph nodes of multiple sites: Secondary | ICD-10-CM

## 2019-02-22 DIAGNOSIS — M79604 Pain in right leg: Secondary | ICD-10-CM

## 2019-02-22 DIAGNOSIS — Z5111 Encounter for antineoplastic chemotherapy: Secondary | ICD-10-CM | POA: Diagnosis not present

## 2019-02-22 DIAGNOSIS — M79671 Pain in right foot: Secondary | ICD-10-CM

## 2019-02-22 DIAGNOSIS — T451X5A Adverse effect of antineoplastic and immunosuppressive drugs, initial encounter: Secondary | ICD-10-CM

## 2019-02-22 LAB — CMP (CANCER CENTER ONLY)
ALT: 20 U/L (ref 0–44)
AST: 12 U/L — ABNORMAL LOW (ref 15–41)
Albumin: 4.1 g/dL (ref 3.5–5.0)
Alkaline Phosphatase: 99 U/L (ref 38–126)
Anion gap: 9 (ref 5–15)
BUN: 20 mg/dL (ref 6–20)
CALCIUM: 8.4 mg/dL — AB (ref 8.9–10.3)
CO2: 26 mmol/L (ref 22–32)
Chloride: 104 mmol/L (ref 98–111)
Creatinine: 0.82 mg/dL (ref 0.61–1.24)
GFR, Estimated: >60 mL/min (ref >60)
Glucose, Bld: 229 mg/dL — ABNORMAL HIGH (ref 70–99)
Potassium: 3.8 mmol/L (ref 3.5–5.1)
Sodium: 139 mmol/L (ref 135–145)
Total Bilirubin: 0.3 mg/dL (ref 0.3–1.2)
Total Protein: 6.1 g/dL — ABNORMAL LOW (ref 6.5–8.1)

## 2019-02-22 LAB — CBC WITH DIFFERENTIAL (CANCER CENTER ONLY)
Abs Immature Granulocytes: 0.35 10*3/uL — ABNORMAL HIGH (ref 0.00–0.07)
Basophils Absolute: 0.1 10*3/uL (ref 0.0–0.1)
Basophils Relative: 1 %
EOS ABS: 0 10*3/uL (ref 0.0–0.5)
Eosinophils Relative: 0 %
HCT: 32 % — ABNORMAL LOW (ref 39.0–52.0)
Hemoglobin: 9.8 g/dL — ABNORMAL LOW (ref 13.0–17.0)
Immature Granulocytes: 6 %
Lymphocytes Relative: 13 %
Lymphs Abs: 0.8 10*3/uL (ref 0.7–4.0)
MCH: 25.1 pg — ABNORMAL LOW (ref 26.0–34.0)
MCHC: 30.6 g/dL (ref 30.0–36.0)
MCV: 81.8 fL (ref 80.0–100.0)
Monocytes Absolute: 0.3 10*3/uL (ref 0.1–1.0)
Monocytes Relative: 4 %
Neutro Abs: 4.6 10*3/uL (ref 1.7–7.7)
Neutrophils Relative %: 76 %
Platelet Count: 332 10*3/uL (ref 150–400)
RBC: 3.91 MIL/uL — AB (ref 4.22–5.81)
RDW: 24.6 % — ABNORMAL HIGH (ref 11.5–15.5)
WBC Count: 6.1 10*3/uL (ref 4.0–10.5)
nRBC: 0.3 % — ABNORMAL HIGH (ref 0.0–0.2)

## 2019-02-22 MED ORDER — VINCRISTINE SULFATE CHEMO INJECTION 1 MG/ML
2.0000 mg | Freq: Once | INTRAVENOUS | Status: AC
  Administered 2019-02-22: 2 mg via INTRAVENOUS
  Filled 2019-02-22: qty 2

## 2019-02-22 MED ORDER — DEXAMETHASONE SODIUM PHOSPHATE 10 MG/ML IJ SOLN
10.0000 mg | Freq: Once | INTRAMUSCULAR | Status: AC
  Administered 2019-02-22: 10 mg via INTRAVENOUS

## 2019-02-22 MED ORDER — ACETAMINOPHEN 325 MG PO TABS
ORAL_TABLET | ORAL | Status: AC
  Filled 2019-02-22: qty 2

## 2019-02-22 MED ORDER — SODIUM CHLORIDE 0.9 % IV SOLN
Freq: Once | INTRAVENOUS | Status: AC
  Administered 2019-02-22: 11:00:00 via INTRAVENOUS
  Filled 2019-02-22: qty 250

## 2019-02-22 MED ORDER — SODIUM CHLORIDE 0.9 % IV SOLN
750.0000 mg/m2 | Freq: Once | INTRAVENOUS | Status: AC
  Administered 2019-02-22: 1540 mg via INTRAVENOUS
  Filled 2019-02-22: qty 77

## 2019-02-22 MED ORDER — ACYCLOVIR 400 MG PO TABS: 400.0000 mg | ORAL_TABLET | Freq: Two times a day (BID) | ORAL | 3 refills | Status: AC

## 2019-02-22 MED ORDER — ACETAMINOPHEN 325 MG PO TABS
650.0000 mg | ORAL_TABLET | Freq: Once | ORAL | Status: AC
  Administered 2019-02-22: 650 mg via ORAL

## 2019-02-22 MED ORDER — SODIUM CHLORIDE 0.9 % IV SOLN
375.0000 mg/m2 | Freq: Once | INTRAVENOUS | Status: AC
  Administered 2019-02-22: 800 mg via INTRAVENOUS
  Filled 2019-02-22: qty 50

## 2019-02-22 MED ORDER — DIPHENHYDRAMINE HCL 25 MG PO CAPS
50.0000 mg | ORAL_CAPSULE | Freq: Once | ORAL | Status: AC
  Administered 2019-02-22: 50 mg via ORAL

## 2019-02-22 MED ORDER — PALONOSETRON HCL INJECTION 0.25 MG/5ML
INTRAVENOUS | Status: AC
  Filled 2019-02-22: qty 5

## 2019-02-22 MED ORDER — PALONOSETRON HCL INJECTION 0.25 MG/5ML
0.2500 mg | Freq: Once | INTRAVENOUS | Status: AC
  Administered 2019-02-22: 0.25 mg via INTRAVENOUS

## 2019-02-22 MED ORDER — HEPARIN SOD (PORK) LOCK FLUSH 100 UNIT/ML IV SOLN
500.0000 [IU] | Freq: Once | INTRAVENOUS | Status: AC | PRN
  Administered 2019-02-22: 500 [IU]
  Filled 2019-02-22: qty 5

## 2019-02-22 MED ORDER — DOXORUBICIN HCL CHEMO IV INJECTION 2 MG/ML
49.0000 mg/m2 | Freq: Once | INTRAVENOUS | Status: AC
  Administered 2019-02-22: 100 mg via INTRAVENOUS
  Filled 2019-02-22: qty 50

## 2019-02-22 MED ORDER — SODIUM CHLORIDE 0.9% FLUSH
10.0000 mL | INTRAVENOUS | Status: DC | PRN
  Administered 2019-02-22: 10 mL
  Filled 2019-02-22: qty 10

## 2019-02-22 MED ORDER — DEXAMETHASONE SODIUM PHOSPHATE 10 MG/ML IJ SOLN
INTRAMUSCULAR | Status: AC
  Filled 2019-02-22: qty 1

## 2019-02-22 MED ORDER — ALLOPURINOL 100 MG PO TABS: 100.0000 mg | ORAL_TABLET | Freq: Every day | ORAL | 5 refills | Status: DC

## 2019-02-22 MED ORDER — GABAPENTIN 300 MG PO CAPS: 300.0000 mg | ORAL_CAPSULE | Freq: Two times a day (BID) | ORAL | 3 refills | Status: DC

## 2019-02-22 MED ORDER — DIPHENHYDRAMINE HCL 25 MG PO CAPS
ORAL_CAPSULE | ORAL | Status: AC
  Filled 2019-02-22: qty 2

## 2019-02-22 NOTE — Progress Notes (Signed)
[place] OFFICE PROGRESS NOTE  Patient Care Team: Maury Dus, MD as PCP - General (Family Medicine)  HEME/ONC OVERVIEW: 1. 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, GCB subtype; poor prognosis by R-IPI; intermediate risk by CNS-IPI  -12/2018: CT showed cervical lymphadenopathy (left supraclavicular LN ~3 x 1.5cm), abnormal soft tissue masses surrounding bilateral kidneys (R 4.0 x 3.2cm; L 3 x 2.5cm) encasing the ureters, RP adenopathy (largest 3.4cm), and R internal iliac lymphadenopathy (conglomerate 4.5 x 2.5cm) -01/2019:   PET showed extensive FDG-avid adenopathy in the cervical, paratracheal, peri-aortic, mesenteric and iliac LN's, as well as extensive skeletal involvement throughout the axial and appendicular skeleton; expansile soft tissue lesions in the left scapula and right sacrum  L supraclavicular LN bx showed DLBCL, Ki-67 70-80%, GCB subtype; FISH positive for Bcl-2 rearrangement (41.5%) but negative for Bcl-6 and Myc rearrangement   Normal LVEF on TTE   MRI brain negative for any CNS involvement -Mid-01/2019 - present: R-CHOP with Udenyca, plan for 6 cycles  -02/2019 - present: HD MTX with Cycles 2, 4 and 6 of R-CHOP at [facility]   2. 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 Cassell Clement -Plan to restage after 3  cycles or R-CHOP to assess interim response -Ppx: allopurinol, acyclovir -PRN anti-emetics: Zofran, Compazine, and Ativan  CNS prophylaxis -HD MTX planned with Cycles 2, 4 and 6 of R-CHOP on Day 14; to be administered inpatient at [place] assistance from Dr. Cassell Clement  Chemotherapy-associated anemia -Secondary to chemotherapy and pulmonary involvement by lymphoma -Hgb 9.8, slightly improving -Patient denies any symptom of bleeding -We will monitor for now; no indication for dose adjustment -If anemia worsens in the future, we will consider delaying chemotherapy or adjusting chemotherapy dose  R lower extremity swelling and pain -Persistent since prior to starting chemotherapy  -I independently reviewed the radiologic images of CT abdomen/pelvis, and agree with the findings as documented -In summary, CT AP did not show any evidence of venous obstruction; furthermore, Doppler of bilateral lower extremities negative for DVT; Xray of the R hip negative for bony abnormalities  -I encouraged patient to use Ace wraps to improve local swelling, as well as to keep the extremity elevated -I have also prescribed gabapentin 334m BID for intermittent neuropathic pain in the right foot, likely due to nerve compression from the RLE swelling -Continue PRN oxycodone for pain   Chemotherapy-associated diarrhea -Secondary to chemotherapy -Diarrhea x 1, resolved without intervention -PRN Imodium  Oral candidiasis -Improving since starting fluconazole on 02/07/2019 -Continue fluconazole x 14 days  -If oral candidiasis recurs after completing fluconazole, we may consider continuing fluconazole until completion of chemotherapy  No orders of the defined types were placed in this encounter.  All questions were answered. The patient knows to call the clinic with any problems, questions  or concerns. No barriers to learning was detected.  Return in 3 weeks for labs, port flush, clinic appt and  Cycle 3 of R-CHOP.   Tish Men, MD 02/22/2019 11:02 AM  CHIEF COMPLAINT: "My right leg still hurts "  INTERVAL HISTORY: Mr. Kozlov returns to clinic for follow-up of DLBCL prior to cycle 2 of R-CHOP.  Patient reports that he has had chronic right sided hip pain at least 2 to 3 weeks prior to starting chemotherapy.  Over the past few days, he has had constant dull pain in the right hip, occasionally sharp radiating to the foot, 2-3 out of 10, associated with tightness in the right calf and swelling in the right foot.  Overall, the intensity of the pain has not changed.  He used to take PRN tramadol with relatively adequate pain control in the past, but yesterday tramadol did not seem to help with the pain.  He has PRN morphine, but has not yet taken it.  He is oral candidiasis has improved significantly since starting fluconazole.  He had a one episode of diarrhea after the first cycle of chemotherapy, which resolved without any intervention.  He denied any associated hematochezia or melena.  His appetite is good.  He denies any other complaint today.  SUMMARY OF ONCOLOGIC HISTORY:   Diffuse large B-cell lymphoma ([place])   12/31/2018 Imaging    CT neck: IMPRESSION: Enlarged cervical lymph nodes in the neck as above. Most prominent nodes are in the left supraclavicular region. This may be due to metastatic disease or lymphoma. No primary head and neck cancer identified.    12/31/2018 Imaging    CTA chest: IMPRESSION: 1. 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 Abnormal soft tissue associated with both kidneys and surrounding the proximal left ureter is also likely due to lymphoma. This soft tissue associated with the kidneys and left ureter results in mild bilateral hydronephrosis, left greater than right. 2. Small pleural effusions, mild pulmonary edema, ascites, and fluid and stranding in the mesentery is all likely due to  third spacing of fluid. The mesenteric findings surrounds the pancreas but is probably part of the broader process of fluid third-spacing. Recommend clinical correlation to exclude signs of pancreatitis. If there is any concern for pancreatitis, recommend obtaining a lipase. 3. Small pulmonary nodules as above. 4. No pulmonary emboli. 5. Coronary artery calcifications.    12/31/2018 Imaging    CT abdomen/pelvis: IMPRESSION: 1. 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 Abnormal soft tissue associated with both kidneys and surrounding the proximal left ureter is also likely due to lymphoma. This soft tissue associated with the kidneys and left ureter results in mild bilateral hydronephrosis, left greater than right. 2. Small pleural effusions, mild pulmonary edema, ascites, and fluid and stranding in the mesentery is all likely due to third spacing of fluid. The mesenteric findings surrounds the pancreas but is probably part of the broader process of fluid third-spacing. Recommend clinical correlation to exclude signs of pancreatitis. If there is any concern for pancreatitis, recommend obtaining a lipase. 3. Small pulmonary nodules as above. 4. No pulmonary emboli. 5. Coronary artery calcifications.    01/01/2019 Initial Diagnosis    Diffuse large B-cell lymphoma ([place])    01/12/2019 Procedure    Left supraclavicular LN biopsy by Dr. Dalbert Batman    01/12/2019 Pathology Results    Accession: [id number]  Tissue-Flow Cytometry - Rayburn Felt  B-CELL POPULATION WITH EXPRESSION OF CD10 COMPRISES 80% OF ALL LYMPHOCYTES - SEE COMMENT    01/12/2019 Pathology Results    Accession: [id number]  Lymph node for lymphoma, Left supraclavicular - DIFFUSE LARGE B-CELL LYMPHOMA - SEE COMMENT Microscopic Comment The excisional biopsy material is composed diffuse lymphoid proliferation. The lymphocytes are medium to large with vesicular  chromatin, inconspicuous to prominent nucleoli, scant cytoplasm and admixed small benign lymphocytes. The atypical cells are B-cells by CD20 immunohistochemistry with expression of CD10, bcl-6, and bcl-2. The proliferative rate is high by ki-67 (70-80%). The neoplastic cells do not express CD5 or EBV by in-situ hybridization. CD3 highlights background T-cells. Flow cytometry performed on the sample identified a kappa-restricted B-cell population with expression of CD10 that comprised 80% of all lymphocytes (See [id number]).    01/15/2019 Imaging    PET: IMPRESSION: 1. Extensive hypermetabolic adenopathy in the cervical lymph nodes, paratracheal nodes, periaortic nodes and iliac nodes. 2. Hypermetabolic nodal metastasis within the mesentery of the small bowel colon. 3. Extensive skeletal metastasis within the axillary and appendicular skeleton with intense metabolic activity. Expansile soft tissue lesions in the superior aspect of the LEFT scapula and RIGHT sacrum. These expansile soft tissue bone metastasis may be best targets for biopsy. 4. Normal spleen.     02/01/2019 -  Chemotherapy    The patient had DOXOrubicin (ADRIAMYCIN) chemo injection 100 mg, 49.5 mg/m2 = 102 mg, Intravenous,  Once, 1 of 6 cycles Administration: 100 mg (02/01/2019) palonosetron (ALOXI) injection 0.25 mg, 0.25 mg, Intravenous,  Once, 1 of 6 cycles Administration: 0.25 mg (02/01/2019) pegfilgrastim-cbqv (UDENYCA) injection 6 mg, 6 mg, Subcutaneous, Once, 1 of 6 cycles Administration: 6 mg (02/02/2019) vinCRIStine (ONCOVIN) 2 mg in sodium chloride 0.9 % 50 mL chemo infusion, 2 mg, Intravenous,  Once, 1 of 6 cycles Administration: 2 mg (02/01/2019) methotrexate (PF) 12 mg in sodium chloride (PF) 0.9 % INTRATHECAL chemo injection, , Intrathecal,  Once, 1 of 1 cycle riTUXimab (RITUXAN) 800 mg in sodium chloride 0.9 % 250 mL (2.4242 mg/mL) infusion, 375 mg/m2 = 800 mg, Intravenous,  Once, 1 of 1 cycle Administration:  800 mg (02/01/2019) cyclophosphamide (CYTOXAN) 1,540 mg in sodium chloride 0.9 % 250 mL chemo infusion, 750 mg/m2 = 1,540 mg, Intravenous,  Once, 1 of 6 cycles Administration: 1,540 mg (02/01/2019) riTUXimab (RITUXAN) 800 mg in sodium chloride 0.9 % 170 mL infusion, 375 mg/m2 = 800 mg (100 % of original dose 375 mg/m2), Intravenous,  Once, 0 of 5 cycles Dose modification: 375 mg/m2 (original dose 375 mg/m2, Cycle 2, Reason: Provider Judgment, Comment: Changing to rapid infusion, tolerated 1st dose well)  for chemotherapy treatment.      REVIEW OF SYSTEMS:   Constitutional: ( - ) fevers, ( - )  chills , ( - ) night sweats Eyes: ( - ) blurriness of vision, ( - ) double vision, ( - ) watery eyes Ears, nose, mouth, throat, and face: ( - ) mucositis, ( - ) sore throat Respiratory: ( - ) cough, ( - ) dyspnea, ( - ) wheezes Cardiovascular: ( - ) palpitation, ( - ) chest discomfort, ( - ) lower extremity swelling Gastrointestinal:  ( - ) nausea, ( - ) heartburn, ( - ) change in bowel habits Skin: ( - ) abnormal skin rashes Lymphatics: ( - ) new lymphadenopathy, ( - ) easy bruising Neurological: ( - ) numbness, ( - ) tingling, ( - ) new weaknesses Behavioral/Psych: ( - ) mood change, ( - ) new changes  All  other systems were reviewed with the patient and are negative.  I have reviewed the past medical history, past surgical history, social history and family history with the patient and they are unchanged from previous note.  ALLERGIES:  is allergic to penicillins.  MEDICATIONS:  Current Outpatient Medications  Medication Sig Dispense Refill  . acetaminophen (TYLENOL) 325 MG tablet Take 975 mg by mouth every 6 (six) hours as needed for mild pain.    Marland Kitchen acyclovir (ZOVIRAX) 400 MG tablet Take 1 tablet (400 mg total) by mouth 2 (two) times daily. 180 tablet 3  . allopurinol (ZYLOPRIM) 100 MG tablet Take 1 tablet (100 mg total) by mouth daily for 30 days. 30 tablet 5  . docusate sodium (COLACE) 100  MG capsule Take 100 mg by mouth 2 (two) times daily.    . ferrous sulfate 325 (65 FE) MG EC tablet Take 325 mg by mouth daily.     . fluconazole (DIFLUCAN) 100 MG tablet Take 2 tabs on Day 1, and then 1 tab daily x 14 days 16 tablet 0  . gabapentin (NEURONTIN) 300 MG capsule Take 1 capsule (300 mg total) by mouth 2 (two) times daily for 30 days. 60 capsule 3  . lidocaine-prilocaine (EMLA) cream Apply to affected area once 30 g 3  . LORazepam (ATIVAN) 0.5 MG tablet Take 1 tablet (0.5 mg total) by mouth every 6 (six) hours as needed (Nausea or vomiting). 30 tablet 0  . magic mouthwash w/lidocaine SOLN Take 5 mLs by mouth 4 (four) times daily as needed for mouth pain. 200 mL 5  . morphine (MSIR) 15 MG tablet Take 1 tablet (15 mg total) by mouth every 4 (four) hours as needed for up to 30 days for severe pain. 90 tablet 0  . ondansetron (ZOFRAN) 8 MG tablet Take 1 tablet (8 mg total) by mouth 2 (two) times daily as needed for refractory nausea / vomiting. Start on day 3 after cyclophosphamide chemotherapy. 30 tablet 1  . predniSONE (DELTASONE) 20 MG tablet Take 100 mg by mouth.    . prochlorperazine (COMPAZINE) 10 MG tablet Take 1 tablet (10 mg total) by mouth every 6 (six) hours as needed (Nausea or vomiting). 30 tablet 6  . traMADol (ULTRAM) 50 MG tablet Take 1 tablet (50 mg total) by mouth every 8 (eight) hours as needed. 90 tablet 0   No current facility-administered medications for this visit.     PHYSICAL EXAMINATION: ECOG PERFORMANCE STATUS: 1 - Symptomatic but completely ambulatory  Today's Vitals   02/22/19 1046  BP: (!) 157/75  Pulse: 81  Resp: 18  Temp: 98.1 F (36.7 C)  TempSrc: Oral  SpO2: 100%  Weight: 188 lb 12.8 oz (85.6 kg)  Height: _0  (1.753 m)  PainSc: 4    Body mass index is 27.88 kg/m.  Filed Weights   02/22/19 1046  Weight: 188 lb 12.8 oz (85.6 kg)    GENERAL: alert, no distress and comfortable SKIN: skin color, texture, turgor are normal, no rashes or  significant lesions EYES: conjunctiva are pink and non-injected, sclera clear OROPHARYNX: no exudate, no erythema; lips, buccal mucosa, and tongue normal  NECK: supple, non-tender LYMPH:  no palpable lymphadenopathy in the cervical LUNGS: clear to auscultation with normal breathing effort HEART: regular rate & rhythm and no murmurs and 1-2+ right lower extremity edema ABDOMEN: soft, non-tender, non-distended, normal bowel sounds Musculoskeletal: no cyanosis of digits and no clubbing  PSYCH: alert & oriented x 3, fluent speech NEURO: no  focal motor/sensory deficits  LABORATORY DATA:  I have reviewed the data as listed    Component Value Date/Time   NA 139 02/22/2019 1018   K 3.8 02/22/2019 1018   CL 104 02/22/2019 1018   CO2 26 02/22/2019 1018   GLUCOSE 229 (H) 02/22/2019 1018   BUN 20 02/22/2019 1018   CREATININE 0.82 02/22/2019 1018   CALCIUM 8.4 (L) 02/22/2019 1018   PROT 6.1 (L) 02/22/2019 1018   ALBUMIN 4.1 02/22/2019 1018   AST 12 (L) 02/22/2019 1018   ALT 20 02/22/2019 1018   ALKPHOS 99 02/22/2019 1018   BILITOT 0.3 02/22/2019 1018   GFRNONAA >60 02/22/2019 1018   GFRAA >60 02/22/2019 1018    No results found for: SPEP, UPEP  Lab Results  Component Value Date   WBC 6.1 02/22/2019   NEUTROABS 4.6 02/22/2019   HGB 9.8 (L) 02/22/2019   HCT 32.0 (L) 02/22/2019   MCV 81.8 02/22/2019   PLT 332 02/22/2019      Chemistry      Component Value Date/Time   NA 139 02/22/2019 1018   K 3.8 02/22/2019 1018   CL 104 02/22/2019 1018   CO2 26 02/22/2019 1018   BUN 20 02/22/2019 1018   CREATININE 0.82 02/22/2019 1018      Component Value Date/Time   CALCIUM 8.4 (L) 02/22/2019 1018   ALKPHOS 99 02/22/2019 1018   AST 12 (L) 02/22/2019 1018   ALT 20 02/22/2019 1018   BILITOT 0.3 02/22/2019 1018       RADIOGRAPHIC STUDIES: I have personally reviewed the radiological images as listed below and agreed with the findings in the report. Ct Abdomen W Contrast  Result  Date: 02/15/2019 CLINICAL DATA:  Followup lymphoma EXAM: CT ABDOMEN WITH CONTRAST TECHNIQUE: Multidetector CT imaging of the abdomen was performed using the standard protocol following bolus administration of intravenous contrast. CONTRAST:  181m OMNIPAQUE IOHEXOL 300 MG/ML  SOLN COMPARISON:  CT AP 12/31/2018 FINDINGS: Lower chest: No acute abnormality. Hepatobiliary: No focal liver abnormality is seen. No gallstones, gallbladder wall thickening, or biliary dilatation. Pancreas: Unremarkable. No pancreatic ductal dilatation or surrounding inflammatory changes. Spleen: Normal in size without focal abnormality. Adrenals/Urinary Tract: Normal appearance of the adrenal glands. Persistent mild bilateral hydronephrosis. Inferior pole left kidney cyst measures 2.2 cm, image 50/2. Stomach/Bowel: Stomach normal. Small bowel loops are unremarkable. There is no pathologic dilatation of the colon. Vascular/Lymphatic: Aortic atherosclerosis. No aneurysm. The portal vein and hepatic veins are patent. The IVC appears patent. The visualized portions of the common iliac veins are also patent. Other: Diffuse soft tissue infiltration throughout the retroperitoneum and mesentery is again noted, improved. Soft tissue mass posterior to the pancreas measures 2.6 x 2.1 by 2.9 cm, image 28/2. Previously 2.7 by 2.7 by 3.7 cm. Retroaortic and periaortic retroperitoneal mass measures 3.6 x 1.6 by 6.4 cm, image 51/2. Previously 4.8 by 3.2 by 7.5 cm. Retroperitoneal mass posterior to scratch set the left retroperitoneal mass posterior to the upper pole of left kidney measures 1.8 x 0.8 by 2.2 cm, image 30/2. Previously 3.4 by 2.0 by 4.3 cm. Musculoskeletal: No acute or significant osseous findings. IMPRESSION: 1. No evidence for IVC obstruction. 2. Interval decrease in size of retroperitoneal tumor compared with 12/31/2018. 3. Persistent mild bilateral hydronephrosis. Electronically Signed   By: TKerby MoorsM.D.   On: 02/15/2019 13:47   Mr  BJeri CosWNGContrast  Result Date: 02/03/2019 CLINICAL DATA:  53y/o M; diffuse large B-cell lymphoma, initial workup  staging. EXAM: MRI HEAD WITHOUT AND WITH CONTRAST TECHNIQUE: Multiplanar, multiecho pulse sequences of the brain and surrounding structures were obtained without and with intravenous contrast. CONTRAST:  8 cc Gadavist COMPARISON:  12/31/2018 CT head.  01/15/2019 PET-CT. FINDINGS: Brain: No acute infarction, hemorrhage, hydrocephalus, extra-axial collection or mass lesion. No significant structural or signal abnormality of the brain identified. After administration of intravenous contrast there is NO abnormal enhancement. Vascular: Normal flow voids. Skull and upper cervical spine: T1 hypointense, diffusion hyperintense, heterogeneously enhancing lesions in the left parietal bone, clivus, left mandibular condyle, and visible cervical vertebral bodies compatible with osseous metastatic disease. Sinuses/Orbits: Negative. Other: None. IMPRESSION: 1. No intracranial metastasis identified. 2. Calvarium, skull base, and upper cervical osseous metastasis. Electronically Signed   By: Kristine Garbe M.D.   On: 02/03/2019 03:36   US Venous Img Lower Bilateral  Result Date: 02/13/2019 CLINICAL DATA:  Lower extremity edema EXAM: BILATERAL LOWER EXTREMITY VENOUS DOPPLER ULTRASOUND TECHNIQUE: Gray-scale sonography with graded compression, as well as color Doppler and duplex ultrasound were performed to evaluate the lower extremity deep venous systems from the level of the common femoral vein and including the common femoral, femoral, profunda femoral, popliteal and calf veins including the posterior tibial, peroneal and gastrocnemius veins when visible. The superficial great saphenous vein was also interrogated. Spectral Doppler was utilized to evaluate flow at rest and with distal augmentation maneuvers in the common femoral, femoral and popliteal veins. COMPARISON:  None. FINDINGS: RIGHT LOWER  EXTREMITY Common Femoral Vein: No evidence of thrombus. Normal compressibility, respiratory phasicity and response to augmentation. Saphenofemoral Junction: No evidence of thrombus. Normal compressibility and flow on color Doppler imaging. Profunda Femoral Vein: No evidence of thrombus. Normal compressibility and flow on color Doppler imaging. Femoral Vein: No evidence of thrombus. Normal compressibility, respiratory phasicity and response to augmentation. Popliteal Vein: No evidence of thrombus. Normal compressibility, respiratory phasicity and response to augmentation. Calf Veins: No evidence of thrombus. Normal compressibility and flow on color Doppler imaging. Superficial Great Saphenous Vein: No evidence of thrombus. Normal compressibility. LEFT LOWER EXTREMITY Common Femoral Vein: No evidence of thrombus. Normal compressibility, respiratory phasicity and response to augmentation. Saphenofemoral Junction: No evidence of thrombus. Normal compressibility and flow on color Doppler imaging. Profunda Femoral Vein: No evidence of thrombus. Normal compressibility and flow on color Doppler imaging. Femoral Vein: No evidence of thrombus. Normal compressibility, respiratory phasicity and response to augmentation. Popliteal Vein: No evidence of thrombus. Normal compressibility, respiratory phasicity and response to augmentation. Calf Veins: No evidence of thrombus. Normal compressibility and flow on color Doppler imaging. Superficial Great Saphenous Vein: No evidence of thrombus. Normal compressibility. IMPRESSION: No evidence of deep venous thrombosis. Electronically Signed   By: Donavan Foil M.D.   On: 02/13/2019 19:49   Dg Chest Port 1 View  Result Date: 01/23/2019 CLINICAL DATA:  Status post Port-A-Cath placement. EXAM: PORTABLE CHEST 1 VIEW COMPARISON:  Radiographs of December 31, 2018. FINDINGS: The heart size and mediastinal contours are within normal limits. Both lungs are clear. Right subclavian Port-A-Cath  is noted with distal tip in expected position of the SVC. No pneumothorax or pleural effusion is noted. The visualized skeletal structures are unremarkable. IMPRESSION: Status post right subclavian Port-A-Cath placement. No acute cardiopulmonary abnormality seen. Electronically Signed   By: Marijo Conception, M.D.   On: 01/23/2019 13:36   Dg Fluoro Guide Cv Line-no Report  Result Date: 01/23/2019 Fluoroscopy was utilized by the requesting physician.  No radiographic interpretation.   Dg Fluoro Guided Loc  Of Needle/cath Tip For Spinal Inject Lt  Result Date: 02/05/2019 CLINICAL DATA:  Lumbar puncture and intrathecal chemo administration. EXAM: FLUOROSCOPICALLY GUIDED LUMBAR PUNCTURE FOR INTRATHECAL CHEMOTHERAPY FLUOROSCOPY TIME:  0.2 minutes PROCEDURE: Informed consent was obtained from the patient prior to the procedure, including potential complications of headache, allergy, and pain. With the patient prone, the lower back was prepped with Betadine. 1% Lidocaine was used for local anesthesia. Lumbar puncture was performed at the L3-4 level using a gauge needle with return of clearCSF. 10 cc of clear CSF was obtained and sent for labs. 5 mL of methotrexate solution was injected into the subarachnoid space. The patient tolerated the procedure well without apparent complication. IMPRESSION: Lumbar puncture and intrathecal chemo administration as above. Electronically Signed   By: Dorise Bullion III M.D   On: 02/05/2019 12:06

## 2019-02-22 NOTE — Patient Instructions (Signed)

## 2019-02-22 NOTE — Telephone Encounter (Signed)
No change in appts  Per 3/12 los °

## 2019-02-22 NOTE — Patient Instructions (Signed)
Cyclophosphamide injection What is this medicine? CYCLOPHOSPHAMIDE (sye kloe FOSS fa mide) is a chemotherapy drug. It slows the growth of cancer cells. This medicine is used to treat many types of cancer like lymphoma, myeloma, leukemia, breast cancer, and ovarian cancer, to name a few. This medicine may be used for other purposes; ask your health care provider or pharmacist if you have questions. COMMON BRAND NAME(S): Cytoxan, Neosar What should I tell my health care provider before I take this medicine? They need to know if you have any of these conditions: -blood disorders -history of other chemotherapy -infection -kidney disease -liver disease -recent or ongoing radiation therapy -tumors in the bone marrow -an unusual or allergic reaction to cyclophosphamide, other chemotherapy, other medicines, foods, dyes, or preservatives -pregnant or trying to get pregnant -breast-feeding How should I use this medicine? This drug is usually given as an injection into a vein or muscle or by infusion into a vein. It is administered in a hospital or clinic by a specially trained health care professional. Talk to your pediatrician regarding the use of this medicine in children. Special care may be needed. Overdosage: If you think you have taken too much of this medicine contact a poison control center or emergency room at once. NOTE: This medicine is only for you. Do not share this medicine with others. What if I miss a dose? It is important not to miss your dose. Call your doctor or health care professional if you are unable to keep an appointment. What may interact with this medicine? This medicine may interact with the following medications: -amiodarone -amphotericin B -azathioprine -certain antiviral medicines for HIV or AIDS such as protease inhibitors (e.g., indinavir, ritonavir) and zidovudine -certain blood pressure medications such as benazepril, captopril, enalapril, fosinopril,  lisinopril, moexipril, monopril, perindopril, quinapril, ramipril, trandolapril -certain cancer medications such as anthracyclines (e.g., daunorubicin, doxorubicin), busulfan, cytarabine, paclitaxel, pentostatin, tamoxifen, trastuzumab -certain diuretics such as chlorothiazide, chlorthalidone, hydrochlorothiazide, indapamide, metolazone -certain medicines that treat or prevent blood clots like warfarin -certain muscle relaxants such as succinylcholine -cyclosporine -etanercept -indomethacin -medicines to increase blood counts like filgrastim, pegfilgrastim, sargramostim -medicines used as general anesthesia -metronidazole -natalizumab This list may not describe all possible interactions. Give your health care provider a list of all the medicines, herbs, non-prescription drugs, or dietary supplements you use. Also tell them if you smoke, drink alcohol, or use illegal drugs. Some items may interact with your medicine. What should I watch for while using this medicine? Visit your doctor for checks on your progress. This drug may make you feel generally unwell. This is not uncommon, as chemotherapy can affect healthy cells as well as cancer cells. Report any side effects. Continue your course of treatment even though you feel ill unless your doctor tells you to stop. Drink water or other fluids as directed. Urinate often, even at night. In some cases, you may be given additional medicines to help with side effects. Follow all directions for their use. Call your doctor or health care professional for advice if you get a fever, chills or sore throat, or other symptoms of a cold or flu. Do not treat yourself. This drug decreases your body's ability to fight infections. Try to avoid being around people who are sick. This medicine may increase your risk to bruise or bleed. Call your doctor or health care professional if you notice any unusual bleeding. Be careful brushing and flossing your teeth or using a  toothpick because you may get an infection or bleed   more easily. If you have any dental work done, tell your dentist you are receiving this medicine. You may get drowsy or dizzy. Do not drive, use machinery, or do anything that needs mental alertness until you know how this medicine affects you. Do not become pregnant while taking this medicine or for 1 year after stopping it. Women should inform their doctor if they wish to become pregnant or think they might be pregnant. Men should not father a child while taking this medicine and for 4 months after stopping it. There is a potential for serious side effects to an unborn child. Talk to your health care professional or pharmacist for more information. Do not breast-feed an infant while taking this medicine. This medicine may interfere with the ability to have a child. This medicine has caused ovarian failure in some women. This medicine has caused reduced sperm counts in some men. You should talk with your doctor or health care professional if you are concerned about your fertility. If you are going to have surgery, tell your doctor or health care professional that you have taken this medicine. What side effects may I notice from receiving this medicine? Side effects that you should report to your doctor or health care professional as soon as possible: -allergic reactions like skin rash, itching or hives, swelling of the face, lips, or tongue -low blood counts - this medicine may decrease the number of white blood cells, red blood cells and platelets. You may be at increased risk for infections and bleeding. -signs of infection - fever or chills, cough, sore throat, pain or difficulty passing urine -signs of decreased platelets or bleeding - bruising, pinpoint red spots on the skin, black, tarry stools, blood in the urine -signs of decreased red blood cells - unusually weak or tired, fainting spells, lightheadedness -breathing problems -dark  urine -dizziness -palpitations -swelling of the ankles, feet, hands -trouble passing urine or change in the amount of urine -weight gain -yellowing of the eyes or skin Side effects that usually do not require medical attention (report to your doctor or health care professional if they continue or are bothersome): -changes in nail or skin color -hair loss -missed menstrual periods -mouth sores -nausea, vomiting This list may not describe all possible side effects. Call your doctor for medical advice about side effects. You may report side effects to FDA at 1-800-FDA-1088. Where should I keep my medicine? This drug is given in a hospital or clinic and will not be stored at home. NOTE: This sheet is a summary. It may not cover all possible information. If you have questions about this medicine, talk to your doctor, pharmacist, or health care provider.  2019 Elsevier/Gold Standard (2012-10-13 16:22:58) Vincristine injection What is this medicine? VINCRISTINE (vin KRIS teen) is a chemotherapy drug. It slows the growth of cancer cells. This medicine is used to treat many types of cancer like Hodgkin's disease, leukemia, non-Hodgkin's lymphoma, neuroblastoma (brain cancer), rhabdomyosarcoma, and Wilms' tumor. This medicine may be used for other purposes; ask your health care provider or pharmacist if you have questions. COMMON BRAND NAME(S): Oncovin, Vincasar PFS What should I tell my health care provider before I take this medicine? They need to know if you have any of these conditions: -blood disorders -gout -infection (especially chickenpox, cold sores, or herpes) -kidney disease -liver disease -lung disease -nervous system disease like Charcot-Marie-Tooth (CMT) -recent or ongoing radiation therapy -an unusual or allergic reaction to vincristine, other chemotherapy agents, other medicines, foods, dyes, or   preservatives -pregnant or trying to get pregnant -breast-feeding How should I  use this medicine? This drug is given as an infusion into a vein. It is administered in a hospital or clinic by a specially trained health care professional. If you have pain, swelling, burning, or any unusual feeling around the site of your injection, tell your health care professional right away. Talk to your pediatrician regarding the use of this medicine in children. While this drug may be prescribed for selected conditions, precautions do apply. Overdosage: If you think you have taken too much of this medicine contact a poison control center or emergency room at once. NOTE: This medicine is only for you. Do not share this medicine with others. What if I miss a dose? It is important not to miss your dose. Call your doctor or health care professional if you are unable to keep an appointment. What may interact with this medicine? Do not take this medicine with any of the following medications: -itraconazole -mibefradil -voriconazole This medicine may also interact with the following medications: -cyclosporine -erythromycin -fluconazole -ketoconazole -medicines for HIV like delavirdine, efavirenz, nevirapine -medicines for seizures like ethotoin, fosphenotoin, phenytoin -medicines to increase blood counts like filgrastim, pegfilgrastim, sargramostim -other chemotherapy drugs like cisplatin, L-asparaginase, methotrexate, mitomycin, paclitaxel -pegaspargase -vaccines -zalcitabine, ddC Talk to your doctor or health care professional before taking any of these medicines: -acetaminophen -aspirin -ibuprofen -ketoprofen -naproxen This list may not describe all possible interactions. Give your health care provider a list of all the medicines, herbs, non-prescription drugs, or dietary supplements you use. Also tell them if you smoke, drink alcohol, or use illegal drugs. Some items may interact with your medicine. What should I watch for while using this medicine? Your condition will be  monitored carefully while you are receiving this medicine. You will need important blood work done while you are taking this medicine. This drug may make you feel generally unwell. This is not uncommon, as chemotherapy can affect healthy cells as well as cancer cells. Report any side effects. Continue your course of treatment even though you feel ill unless your doctor tells you to stop. In some cases, you may be given additional medicines to help with side effects. Follow all directions for their use. Call your doctor or health care professional for advice if you get a fever, chills or sore throat, or other symptoms of a cold or flu. Do not treat yourself. Avoid taking products that contain aspirin, acetaminophen, ibuprofen, naproxen, or ketoprofen unless instructed by your doctor. These medicines may hide a fever. Do not become pregnant while taking this medicine. Women should inform their doctor if they wish to become pregnant or think they might be pregnant. There is a potential for serious side effects to an unborn child. Talk to your health care professional or pharmacist for more information. Do not breast-feed an infant while taking this medicine. Men may have a lower sperm count while taking this medicine. Talk to your doctor if you plan to father a child. What side effects may I notice from receiving this medicine? Side effects that you should report to your doctor or health care professional as soon as possible: -allergic reactions like skin rash, itching or hives, swelling of the face, lips, or tongue -breathing problems -confusion or changes in emotions or moods -constipation -cough -mouth sores -muscle weakness -nausea and vomiting -pain, swelling, redness or irritation at the injection site -pain, tingling, numbness in the hands or feet -problems with balance, talking, walking -seizures -stomach   pain -trouble passing urine or change in the amount of urine Side effects that  usually do not require medical attention (report to your doctor or health care professional if they continue or are bothersome): -diarrhea -hair loss -jaw pain -loss of appetite This list may not describe all possible side effects. Call your doctor for medical advice about side effects. You may report side effects to FDA at 1-800-FDA-1088. Where should I keep my medicine? This drug is given in a hospital or clinic and will not be stored at home. NOTE: This sheet is a summary. It may not cover all possible information. If you have questions about this medicine, talk to your doctor, pharmacist, or health care provider.  2019 Elsevier/Gold Standard (2008-08-26 17:17:13) Doxorubicin injection What is this medicine? DOXORUBICIN (dox oh ROO bi sin) is a chemotherapy drug. It is used to treat many kinds of cancer like leukemia, lymphoma, neuroblastoma, sarcoma, and Wilms' tumor. It is also used to treat bladder cancer, breast cancer, lung cancer, ovarian cancer, stomach cancer, and thyroid cancer. This medicine may be used for other purposes; ask your health care provider or pharmacist if you have questions. COMMON BRAND NAME(S): Adriamycin, Adriamycin PFS, Adriamycin RDF, Rubex What should I tell my health care provider before I take this medicine? They need to know if you have any of these conditions: -heart disease -history of low blood counts caused by a medicine -liver disease -recent or ongoing radiation therapy -an unusual or allergic reaction to doxorubicin, other chemotherapy agents, other medicines, foods, dyes, or preservatives -pregnant or trying to get pregnant -breast-feeding How should I use this medicine? This drug is given as an infusion into a vein. It is administered in a hospital or clinic by a specially trained health care professional. If you have pain, swelling, burning or any unusual feeling around the site of your injection, tell your health care professional right  away. Talk to your pediatrician regarding the use of this medicine in children. Special care may be needed. Overdosage: If you think you have taken too much of this medicine contact a poison control center or emergency room at once. NOTE: This medicine is only for you. Do not share this medicine with others. What if I miss a dose? It is important not to miss your dose. Call your doctor or health care professional if you are unable to keep an appointment. What may interact with this medicine? This medicine may interact with the following medications: -6-mercaptopurine -paclitaxel -phenytoin -St. John's Wort -trastuzumab -verapamil This list may not describe all possible interactions. Give your health care provider a list of all the medicines, herbs, non-prescription drugs, or dietary supplements you use. Also tell them if you smoke, drink alcohol, or use illegal drugs. Some items may interact with your medicine. What should I watch for while using this medicine? This drug may make you feel generally unwell. This is not uncommon, as chemotherapy can affect healthy cells as well as cancer cells. Report any side effects. Continue your course of treatment even though you feel ill unless your doctor tells you to stop. There is a maximum amount of this medicine you should receive throughout your life. The amount depends on the medical condition being treated and your overall health. Your doctor will watch how much of this medicine you receive in your lifetime. Tell your doctor if you have taken this medicine before. You may need blood work done while you are taking this medicine. Your urine may turn red for a   few days after your dose. This is not blood. If your urine is dark or brown, call your doctor. In some cases, you may be given additional medicines to help with side effects. Follow all directions for their use. Call your doctor or health care professional for advice if you get a fever, chills or  sore throat, or other symptoms of a cold or flu. Do not treat yourself. This drug decreases your body's ability to fight infections. Try to avoid being around people who are sick. This medicine may increase your risk to bruise or bleed. Call your doctor or health care professional if you notice any unusual bleeding. Talk to your doctor about your risk of cancer. You may be more at risk for certain types of cancers if you take this medicine. Do not become pregnant while taking this medicine or for 6 months after stopping it. Women should inform their doctor if they wish to become pregnant or think they might be pregnant. Men should not father a child while taking this medicine and for 6 months after stopping it. There is a potential for serious side effects to an unborn child. Talk to your health care professional or pharmacist for more information. Do not breast-feed an infant while taking this medicine. This medicine has caused ovarian failure in some women and reduced sperm counts in some men This medicine may interfere with the ability to have a child. Talk with your doctor or health care professional if you are concerned about your fertility. This medicine may cause a decrease in Co-Enzyme Q-10. You should make sure that you get enough Co-Enzyme Q-10 while you are taking this medicine. Discuss the foods you eat and the vitamins you take with your health care professional. What side effects may I notice from receiving this medicine? Side effects that you should report to your doctor or health care professional as soon as possible: -allergic reactions like skin rash, itching or hives, swelling of the face, lips, or tongue -breathing problems -chest pain -fast or irregular heartbeat -low blood counts - this medicine may decrease the number of white blood cells, red blood cells and platelets. You may be at increased risk for infections and bleeding. -pain, redness, or irritation at site where  injected -signs of infection - fever or chills, cough, sore throat, pain or difficulty passing urine -signs of decreased platelets or bleeding - bruising, pinpoint red spots on the skin, black, tarry stools, blood in the urine -swelling of the ankles, feet, hands -tiredness -weakness Side effects that usually do not require medical attention (report to your doctor or health care professional if they continue or are bothersome): -diarrhea -hair loss -mouth sores -nail discoloration or damage -nausea -red colored urine -vomiting This list may not describe all possible side effects. Call your doctor for medical advice about side effects. You may report side effects to FDA at 1-800-FDA-1088. Where should I keep my medicine? This drug is given in a hospital or clinic and will not be stored at home. NOTE: This sheet is a summary. It may not cover all possible information. If you have questions about this medicine, talk to your doctor, pharmacist, or health care provider.  2019 Elsevier/Gold Standard (2017-07-13 11:01:26) Rituximab injection What is this medicine? RITUXIMAB (ri TUX i mab) is a monoclonal antibody. It is used to treat certain types of cancer like non-Hodgkin lymphoma and chronic lymphocytic leukemia. It is also used to treat rheumatoid arthritis, granulomatosis with polyangiitis (or Wegener's granulomatosis), microscopic polyangiitis, and   pemphigus vulgaris. This medicine may be used for other purposes; ask your health care provider or pharmacist if you have questions. COMMON BRAND NAME(S): Rituxan What should I tell my health care provider before I take this medicine? They need to know if you have any of these conditions: -heart disease -infection (especially a virus infection such as hepatitis B, chickenpox, cold sores, or herpes) -immune system problems -irregular heartbeat -kidney disease -low blood counts, like low white cell, platelet, or red cell counts -lung or  breathing disease, like asthma -recently received or scheduled to receive a vaccine -an unusual or allergic reaction to rituximab, other medicines, foods, dyes, or preservatives -pregnant or trying to get pregnant -breast-feeding How should I use this medicine? This medicine is for infusion into a vein. It is administered in a hospital or clinic by a specially trained health care professional. A special MedGuide will be given to you by the pharmacist with each prescription and refill. Be sure to read this information carefully each time. Talk to your pediatrician regarding the use of this medicine in children. This medicine is not approved for use in children. Overdosage: If you think you have taken too much of this medicine contact a poison control center or emergency room at once. NOTE: This medicine is only for you. Do not share this medicine with others. What if I miss a dose? It is important not to miss a dose. Call your doctor or health care professional if you are unable to keep an appointment. What may interact with this medicine? -cisplatin -live virus vaccines This list may not describe all possible interactions. Give your health care provider a list of all the medicines, herbs, non-prescription drugs, or dietary supplements you use. Also tell them if you smoke, drink alcohol, or use illegal drugs. Some items may interact with your medicine. What should I watch for while using this medicine? Your condition will be monitored carefully while you are receiving this medicine. You may need blood work done while you are taking this medicine. This medicine can cause serious allergic reactions. To reduce your risk you may need to take medicine before treatment with this medicine. Take your medicine as directed. In some patients, this medicine may cause a serious brain infection that may cause death. If you have any problems seeing, thinking, speaking, walking, or standing, tell your healthcare  professional right away. If you cannot reach your healthcare professional, urgently seek other source of medical care. Call your doctor or health care professional for advice if you get a fever, chills or sore throat, or other symptoms of a cold or flu. Do not treat yourself. This drug decreases your body's ability to fight infections. Try to avoid being around people who are sick. Do not become pregnant while taking this medicine or for 12 months after stopping it. Women should inform their doctor if they wish to become pregnant or think they might be pregnant. There is a potential for serious side effects to an unborn child. Talk to your health care professional or pharmacist for more information. Do not breast-feed an infant while taking this medicine or for 6 months after stopping it. What side effects may I notice from receiving this medicine? Side effects that you should report to your doctor or health care professional as soon as possible: -allergic reactions like skin rash, itching or hives; swelling of the face, lips, or tongue -breathing problems -chest pain -changes in vision -diarrhea -headache with fever, neck stiffness, sensitivity to light, nausea,   or confusion -fast, irregular heartbeat -loss of memory -low blood counts - this medicine may decrease the number of white blood cells, red blood cells and platelets. You may be at increased risk for infections and bleeding. -mouth sores -problems with balance, talking, or walking -redness, blistering, peeling or loosening of the skin, including inside the mouth -signs of infection - fever or chills, cough, sore throat, pain or difficulty passing urine -signs and symptoms of kidney injury like trouble passing urine or change in the amount of urine -signs and symptoms of liver injury like dark yellow or brown urine; general ill feeling or flu-like symptoms; light-colored stools; loss of appetite; nausea; right upper belly pain; unusually  weak or tired; yellowing of the eyes or skin -signs and symptoms of low blood pressure like dizziness; feeling faint or lightheaded, falls; unusually weak or tired -stomach pain -swelling of the ankles, feet, hands -unusual bleeding or bruising -vomiting Side effects that usually do not require medical attention (report to your doctor or health care professional if they continue or are bothersome): -headache -joint pain -muscle cramps or muscle pain -nausea -tiredness This list may not describe all possible side effects. Call your doctor for medical advice about side effects. You may report side effects to FDA at 1-800-FDA-1088. Where should I keep my medicine? This drug is given in a hospital or clinic and will not be stored at home. NOTE: This sheet is a summary. It may not cover all possible information. If you have questions about this medicine, talk to your doctor, pharmacist, or health care provider.  2019 Elsevier/Gold Standard (2017-11-11 13:04:32)  

## 2019-02-23 ENCOUNTER — Other Ambulatory Visit: Payer: Self-pay

## 2019-02-23 ENCOUNTER — Inpatient Hospital Stay: Payer: BC Managed Care – PPO

## 2019-02-23 VITALS — BP 152/85 | HR 75 | Temp 98.3°F | Resp 18

## 2019-02-23 DIAGNOSIS — Z5111 Encounter for antineoplastic chemotherapy: Secondary | ICD-10-CM | POA: Diagnosis not present

## 2019-02-23 DIAGNOSIS — C8338 Diffuse large B-cell lymphoma, lymph nodes of multiple sites: Secondary | ICD-10-CM

## 2019-02-23 MED ORDER — PEGFILGRASTIM-CBQV 6 MG/0.6ML ~~LOC~~ SOSY
PREFILLED_SYRINGE | SUBCUTANEOUS | Status: AC
  Filled 2019-02-23: qty 0.6

## 2019-02-23 MED ORDER — PEGFILGRASTIM-CBQV 6 MG/0.6ML ~~LOC~~ SOSY
6.0000 mg | PREFILLED_SYRINGE | Freq: Once | SUBCUTANEOUS | Status: AC
  Administered 2019-02-23: 6 mg via SUBCUTANEOUS

## 2019-02-23 NOTE — Patient Instructions (Signed)
Pegfilgrastim injection  What is this medicine?  PEGFILGRASTIM (PEG fil gra stim) is a long-acting granulocyte colony-stimulating factor that stimulates the growth of neutrophils, a type of white blood cell important in the body's fight against infection. It is used to reduce the incidence of fever and infection in patients with certain types of cancer who are receiving chemotherapy that affects the bone marrow, and to increase survival after being exposed to high doses of radiation.  This medicine may be used for other purposes; ask your health care provider or pharmacist if you have questions.  COMMON BRAND NAME(S): Fulphila, Neulasta, UDENYCA  What should I tell my health care provider before I take this medicine?  They need to know if you have any of these conditions:  -kidney disease  -latex allergy  -ongoing radiation therapy  -sickle cell disease  -skin reactions to acrylic adhesives (On-Body Injector only)  -an unusual or allergic reaction to pegfilgrastim, filgrastim, other medicines, foods, dyes, or preservatives  -pregnant or trying to get pregnant  -breast-feeding  How should I use this medicine?  This medicine is for injection under the skin. If you get this medicine at home, you will be taught how to prepare and give the pre-filled syringe or how to use the On-body Injector. Refer to the patient Instructions for Use for detailed instructions. Use exactly as directed. Tell your healthcare provider immediately if you suspect that the On-body Injector may not have performed as intended or if you suspect the use of the On-body Injector resulted in a missed or partial dose.  It is important that you put your used needles and syringes in a special sharps container. Do not put them in a trash can. If you do not have a sharps container, call your pharmacist or healthcare provider to get one.  Talk to your pediatrician regarding the use of this medicine in children. While this drug may be prescribed for  selected conditions, precautions do apply.  Overdosage: If you think you have taken too much of this medicine contact a poison control center or emergency room at once.  NOTE: This medicine is only for you. Do not share this medicine with others.  What if I miss a dose?  It is important not to miss your dose. Call your doctor or health care professional if you miss your dose. If you miss a dose due to an On-body Injector failure or leakage, a new dose should be administered as soon as possible using a single prefilled syringe for manual use.  What may interact with this medicine?  Interactions have not been studied.  Give your health care provider a list of all the medicines, herbs, non-prescription drugs, or dietary supplements you use. Also tell them if you smoke, drink alcohol, or use illegal drugs. Some items may interact with your medicine.  This list may not describe all possible interactions. Give your health care provider a list of all the medicines, herbs, non-prescription drugs, or dietary supplements you use. Also tell them if you smoke, drink alcohol, or use illegal drugs. Some items may interact with your medicine.  What should I watch for while using this medicine?  You may need blood work done while you are taking this medicine.  If you are going to need a MRI, CT scan, or other procedure, tell your doctor that you are using this medicine (On-Body Injector only).  What side effects may I notice from receiving this medicine?  Side effects that you should report to   your doctor or health care professional as soon as possible:  -allergic reactions like skin rash, itching or hives, swelling of the face, lips, or tongue  -back pain  -dizziness  -fever  -pain, redness, or irritation at site where injected  -pinpoint red spots on the skin  -red or dark-brown urine  -shortness of breath or breathing problems  -stomach or side pain, or pain at the shoulder  -swelling  -tiredness  -trouble passing urine or  change in the amount of urine  Side effects that usually do not require medical attention (report to your doctor or health care professional if they continue or are bothersome):  -bone pain  -muscle pain  This list may not describe all possible side effects. Call your doctor for medical advice about side effects. You may report side effects to FDA at 1-800-FDA-1088.  Where should I keep my medicine?  Keep out of the reach of children.  If you are using this medicine at home, you will be instructed on how to store it. Throw away any unused medicine after the expiration date on the label.  NOTE: This sheet is a summary. It may not cover all possible information. If you have questions about this medicine, talk to your doctor, pharmacist, or health care provider.   2019 Elsevier/Gold Standard (2018-03-06 16:57:08)

## 2019-03-01 ENCOUNTER — Other Ambulatory Visit: Payer: Self-pay | Admitting: Hematology

## 2019-03-01 ENCOUNTER — Telehealth: Payer: Self-pay | Admitting: *Deleted

## 2019-03-01 DIAGNOSIS — K1230 Oral mucositis (ulcerative), unspecified: Secondary | ICD-10-CM

## 2019-03-01 DIAGNOSIS — C8330 Diffuse large B-cell lymphoma, unspecified site: Secondary | ICD-10-CM

## 2019-03-01 DIAGNOSIS — M79604 Pain in right leg: Secondary | ICD-10-CM

## 2019-03-01 MED ORDER — MORPHINE SULFATE 15 MG PO TABS: 15.0000 mg | ORAL_TABLET | ORAL | 0 refills | Status: AC | PRN

## 2019-03-01 MED ORDER — FLUCONAZOLE 100 MG PO TABS: 100.0000 mg | ORAL_TABLET | Freq: Every day | ORAL | 3 refills | Status: AC

## 2019-03-01 NOTE — Telephone Encounter (Signed)
Returned call to pt regarding thrush and leg pain. Pt asked for refill on morphine, tramadol, diflucan and magic mouthwash. Pt advised he has continue to elevate leg, swelling has gone down, however pain is worse when trying to sleep. Will review with MD.

## 2019-03-02 ENCOUNTER — Other Ambulatory Visit: Payer: Self-pay | Admitting: *Deleted

## 2019-03-02 DIAGNOSIS — K1230 Oral mucositis (ulcerative), unspecified: Secondary | ICD-10-CM

## 2019-03-02 MED ORDER — MAGIC MOUTHWASH W/LIDOCAINE: 5.0000 mL | Freq: Four times a day (QID) | ORAL | 5 refills | Status: AC | PRN

## 2019-03-12 LAB — FLOW CYTOMETRY

## 2019-03-14 NOTE — Progress Notes (Signed)
[place] [facility] OFFICE PROGRESS NOTE  Patient Care Team: Maury Dus, MD as PCP - General (Family Medicine)  HEME/ONC OVERVIEW: 1. 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, GCB subtype; poor prognosis by R-IPI; intermediate risk by CNS-IPI  -12/2018: CT showed cervical lymphadenopathy (left supraclavicular LN ~3 x 1.5cm), abnormal soft tissue masses surrounding bilateral kidneys (R 4.0 x 3.2cm; L 3 x 2.5cm) encasing the ureters, RP adenopathy (largest 3.4cm), and R internal iliac lymphadenopathy (conglomerate 4.5 x 2.5cm) -01/2019:   PET showed extensive FDG-avid adenopathy in the cervical, paratracheal, peri-aortic, mesenteric and iliac LN's, as well as extensive skeletal involvement throughout the axial and appendicular skeleton; expansile soft tissue lesions in the left scapula and right sacrum  L supraclavicular LN bx showed DLBCL, Ki-67 70-80%, GCB subtype; FISH positive for Bcl-2 rearrangement (41.5%) but negative for Bcl-6 and Myc rearrangement   Normal LVEF on TTE   MRI brain negative for any CNS involvement -Mid-01/2019 - present: R-CHOP with Udenyca, plan for 6 cycles  -02/2019 - present: HD MTX with Cycles 2, 4 and 6 of R-CHOP at [facility]   2. Port placement in 01/2019   TREATMENT REGIMEN:  02/01/2019 - present: R-CHOP with Udenyca, plan for 6 cycles  IT MTX with Cycle 1; HD MTX with Cycles 2, 4 and 6 of chemotherapy at [place]:   AKI -New; Cr 1.8 today, up from baseline < 1 -Most likely secondary to MTX toxicity -I discussed the case with Dr. 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; GCB subtype  -S/p 2 cycles of R-CHOP; IT  MTX with Cycle 1 and HD-MTX with Cycle 2 (administered on 03/07/2019 at [facility]) -Due to AKI, we will delay Cycle 3 by one week (ie 03/22/2019) -Plan for HD MTX with Cycles 4 and 6 of chemotherapy at [facility] -I have ordered PET after Cycle 3 of chemotherapy to assess interim response -Ppx: allopurinol, acyclovir -PRN anti-emetics: Zofran, Compazine and Ativan  CNS prophylaxis -S/p 1 cycle of IT and HD MTX -HD MTX planned with Cycle 4 and 6 of R-CHOP at [facility] under the guidance of Dr. Cassell Clement   Chemotherapy-associated anemia -Secondary to chemotherapy -Hgb 9.1, stable -Patient denies any symptom of bleeding -We will monitor for now; no indication for dose adjustment -If anemia worsens in the future, we will consider delaying chemotherapy or adjusting chemotherapy dose  R lower extremity pain  -No acute abnormalities on recent CT -Overall improving with treatment -We discussed ROM exercises to improve mobility -ACE wraps for swelling -Continue gabapentin and PRN tramadol   Oral candidiasis -On fluconazole since late 01/2019 -Due to recurrent oral candidiasis, we will plan to continue prophylactic fluconazole 1104m daily until the completion of chemotherapy   Orders Placed This Encounter  Procedures  . NM PET Image Restag (PS) Skull Base To Thigh    Standing Status:   Future    Standing Expiration Date:   03/14/2020    Order Specific Question:   If indicated for the ordered procedure, I authorize the administration of a radiopharmaceutical per Radiology protocol    Answer:   Yes    Order Specific Question:  Preferred imaging location?    Answer:   [facility]    Order Specific Question:   Radiology Contrast Protocol - do NOT remove file path    Answer:   [email or web address]  . Methotrexate level    Standing Status:   Future    Number of Occurrences:   1    Standing Expiration Date:   03/14/2020   All questions were answered. The patient knows  to call the clinic with any problems, questions or concerns. No barriers to learning was detected.  Return in 1 week for labs, port flush and clinic appt prior to Cycle 3 of R-CHOP.   Tish Men, MD 03/15/2019 11:59 AM  CHIEF COMPLAINT: "My leg pain is better"  INTERVAL HISTORY: Mr. Eichinger returns to clinic for follow-up of DLBCL on R-CHOP.  He was discharged from [facility] on 03/10/2019 after being admitted for scheduled inpatient high-dose methotrexate.  He tolerated the treatment well, and denied any significant side effects.  He reports that he is right foot swelling has continued to improve since starting treatment.  He still has intermittent right foot pain, for which he takes occasional tramadol with relief of the pain, but overall it is getting better.  The pain in the right hip has nearly completely resolved.  He has been drinking plenty of fluids, including sweet tea.  SUMMARY OF ONCOLOGIC HISTORY:   Diffuse large B-cell lymphoma ([place])   12/31/2018 Imaging    CT neck: IMPRESSION: Enlarged cervical lymph nodes in the neck as above. Most prominent nodes are in the left supraclavicular region. This may be due to metastatic disease or lymphoma. No primary head and neck cancer identified.    12/31/2018 Imaging    CTA chest: IMPRESSION: 1. 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 Abnormal soft tissue associated with both kidneys and surrounding the proximal left ureter is also likely due to lymphoma. This soft tissue associated with the kidneys and left ureter results in mild bilateral hydronephrosis, left greater than right. 2. Small pleural effusions, mild pulmonary edema, ascites, and fluid and stranding in the mesentery is all likely due to third spacing of fluid. The mesenteric findings surrounds the pancreas but is probably part of the broader process of fluid third-spacing. Recommend clinical correlation to  exclude signs of pancreatitis. If there is any concern for pancreatitis, recommend obtaining a lipase. 3. Small pulmonary nodules as above. 4. No pulmonary emboli. 5. Coronary artery calcifications.    12/31/2018 Imaging    CT abdomen/pelvis: IMPRESSION: 1. 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 Abnormal soft tissue associated with both kidneys and surrounding the proximal left ureter is also likely due to lymphoma. This soft tissue associated with the kidneys and left ureter results in mild bilateral hydronephrosis, left greater than right. 2. Small pleural effusions, mild pulmonary edema, ascites, and fluid and stranding in the mesentery is all likely due to third spacing of fluid. The mesenteric findings surrounds the pancreas but is probably part of the broader process of fluid third-spacing. Recommend clinical correlation to exclude signs of pancreatitis. If there is any concern for pancreatitis, recommend obtaining a lipase. 3. Small pulmonary nodules as above. 4. No pulmonary emboli. 5. Coronary artery calcifications.    01/01/2019 Initial Diagnosis    Diffuse large B-cell lymphoma ([place])    01/12/2019 Procedure    Left supraclavicular LN biopsy by Dr. Dalbert Batman  01/12/2019 Pathology Results    Accession: [id number]  Tissue-Flow Cytometry - MONOCLONAL B-CELL POPULATION WITH EXPRESSION OF CD10 COMPRISES 80% OF ALL LYMPHOCYTES - SEE COMMENT    01/12/2019 Pathology Results    Accession: [id number]  Lymph node for lymphoma, Left supraclavicular - DIFFUSE LARGE B-CELL LYMPHOMA - SEE COMMENT Microscopic Comment The excisional biopsy material is composed diffuse lymphoid proliferation. The lymphocytes are medium to large with vesicular chromatin, inconspicuous to prominent nucleoli, scant cytoplasm and admixed small benign lymphocytes. The atypical cells are B-cells by CD20 immunohistochemistry with expression  of CD10, bcl-6, and bcl-2. The proliferative rate is high by ki-67 (70-80%). The neoplastic cells do not express CD5 or EBV by in-situ hybridization. CD3 highlights background T-cells. Flow cytometry performed on the sample identified a kappa-restricted B-cell population with expression of CD10 that comprised 80% of all lymphocytes (See [id number]).    01/15/2019 Imaging    PET: IMPRESSION: 1. Extensive hypermetabolic adenopathy in the cervical lymph nodes, paratracheal nodes, periaortic nodes and iliac nodes. 2. Hypermetabolic nodal metastasis within the mesentery of the small bowel colon. 3. Extensive skeletal metastasis within the axillary and appendicular skeleton with intense metabolic activity. Expansile soft tissue lesions in the superior aspect of the LEFT scapula and RIGHT sacrum. These expansile soft tissue bone metastasis may be best targets for biopsy. 4. Normal spleen.     02/01/2019 -  Chemotherapy    The patient had DOXOrubicin (ADRIAMYCIN) chemo injection 100 mg, 49.5 mg/m2 = 102 mg, Intravenous,  Once, 2 of 6 cycles Administration: 100 mg (02/01/2019), 100 mg (02/22/2019) palonosetron (ALOXI) injection 0.25 mg, 0.25 mg, Intravenous,  Once, 2 of 6 cycles Administration: 0.25 mg (02/01/2019), 0.25 mg (02/22/2019) pegfilgrastim-cbqv (UDENYCA) injection 6 mg, 6 mg, Subcutaneous, Once, 2 of 6 cycles Administration: 6 mg (02/02/2019), 6 mg (02/23/2019) vinCRIStine (ONCOVIN) 2 mg in sodium chloride 0.9 % 50 mL chemo infusion, 2 mg, Intravenous,  Once, 2 of 6 cycles Administration: 2 mg (02/01/2019), 2 mg (02/22/2019) methotrexate (PF) 12 mg in sodium chloride (PF) 0.9 % INTRATHECAL chemo injection, , Intrathecal,  Once, 1 of 1 cycle Administration:  (02/05/2019) riTUXimab (RITUXAN) 800 mg in sodium chloride 0.9 % 250 mL (2.4242 mg/mL) infusion, 375 mg/m2 = 800 mg, Intravenous,  Once, 1 of 1 cycle Administration: 800 mg (02/01/2019) cyclophosphamide (CYTOXAN) 1,540 mg in sodium  chloride 0.9 % 250 mL chemo infusion, 750 mg/m2 = 1,540 mg, Intravenous,  Once, 2 of 6 cycles Administration: 1,540 mg (02/01/2019), 1,540 mg (02/22/2019) riTUXimab (RITUXAN) 800 mg in sodium chloride 0.9 % 170 mL infusion, 375 mg/m2 = 800 mg (100 % of original dose 375 mg/m2), Intravenous,  Once, 1 of 5 cycles Dose modification: 375 mg/m2 (original dose 375 mg/m2, Cycle 2, Reason: Provider Judgment, Comment: Changing to rapid infusion, tolerated 1st dose well) Administration: 800 mg (02/22/2019)  for chemotherapy treatment.      REVIEW OF SYSTEMS:   Constitutional: ( - ) fevers, ( - )  chills , ( - ) night sweats Eyes: ( - ) blurriness of vision, ( - ) double vision, ( - ) watery eyes Ears, nose, mouth, throat, and face: ( - ) mucositis, ( - ) sore throat Respiratory: ( - ) cough, ( - ) dyspnea, ( - ) wheezes Cardiovascular: ( - ) palpitation, ( - ) chest discomfort, ( - ) lower extremity swelling Gastrointestinal:  ( - ) nausea, ( - ) heartburn, ( - ) change in bowel habits Skin: ( - ) abnormal skin rashes Lymphatics: ( - )  new lymphadenopathy, ( - ) easy bruising Neurological: ( - ) numbness, ( - ) tingling, ( - ) new weaknesses Behavioral/Psych: ( - ) mood change, ( - ) new changes  All other systems were reviewed with the patient and are negative.  I have reviewed the past medical history, past surgical history, social history and family history with the patient and they are unchanged from previous note.  ALLERGIES:  is allergic to penicillins.  MEDICATIONS:  Current Outpatient Medications  Medication Sig Dispense Refill  . acetaminophen (TYLENOL) 325 MG tablet Take 975 mg by mouth every 6 (six) hours as needed for mild pain.    Marland Kitchen acyclovir (ZOVIRAX) 400 MG tablet Take 1 tablet (400 mg total) by mouth 2 (two) times daily. 180 tablet 3  . allopurinol (ZYLOPRIM) 100 MG tablet Take 1 tablet (100 mg total) by mouth daily for 30 days. 30 tablet 5  . docusate sodium (COLACE) 100 MG  capsule Take 100 mg by mouth 2 (two) times daily.    . ferrous sulfate 325 (65 FE) MG EC tablet Take 325 mg by mouth daily.     . fluconazole (DIFLUCAN) 100 MG tablet Take 1 tablet (100 mg total) by mouth daily for 30 days. Take 2 tabs on Day 1, and then 1 tab daily x 14 days 30 tablet 3  . gabapentin (NEURONTIN) 300 MG capsule Take 1 capsule (300 mg total) by mouth 2 (two) times daily for 30 days. 60 capsule 3  . lidocaine-prilocaine (EMLA) cream Apply to affected area once 30 g 3  . LORazepam (ATIVAN) 0.5 MG tablet Take 1 tablet (0.5 mg total) by mouth every 6 (six) hours as needed (Nausea or vomiting). 30 tablet 0  . magic mouthwash w/lidocaine SOLN Take 5 mLs by mouth 4 (four) times daily as needed for mouth pain. 200 mL 5  . morphine (MSIR) 15 MG tablet Take 1 tablet (15 mg total) by mouth every 4 (four) hours as needed for up to 30 days for severe pain. 90 tablet 0  . ondansetron (ZOFRAN) 8 MG tablet Take 1 tablet (8 mg total) by mouth 2 (two) times daily as needed for refractory nausea / vomiting. Start on day 3 after cyclophosphamide chemotherapy. (Patient not taking: Reported on 02/23/2019) 30 tablet 1  . predniSONE (DELTASONE) 20 MG tablet Take 100 mg by mouth.    . prochlorperazine (COMPAZINE) 10 MG tablet Take 1 tablet (10 mg total) by mouth every 6 (six) hours as needed (Nausea or vomiting). (Patient not taking: Reported on 02/23/2019) 30 tablet 6  . traMADol (ULTRAM) 50 MG tablet Take 1 tablet (50 mg total) by mouth every 8 (eight) hours as needed. 90 tablet 0   No current facility-administered medications for this visit.    Facility-Administered Medications Ordered in Other Visits  Medication Dose Route Frequency Provider Last Rate Last Dose  . 0.9 %  sodium chloride infusion   Intravenous Continuous Tish Men, MD 500 mL/hr at 03/15/19 1126      PHYSICAL EXAMINATION: ECOG PERFORMANCE STATUS: 1 - Symptomatic but completely ambulatory  Today's Vitals   03/15/19 1104  BP: (!)  154/87  Pulse: 86  Resp: 18  Temp: 98.2 F (36.8 C)  TempSrc: Oral  SpO2: 99%  Weight: 194 lb (88 kg)  PainSc: 3    Body mass index is 28.65 kg/m.  Filed Weights   03/15/19 1104  Weight: 194 lb (88 kg)    GENERAL: alert, no distress and comfortable SKIN: skin color,  texture, turgor are normal, no rashes or significant lesions EYES: conjunctiva are pink and non-injected, sclera clear OROPHARYNX: no exudate, no erythema; lips, buccal mucosa, and tongue normal, no candidiasis NECK: supple, non-tender LYMPH:  no palpable lymphadenopathy in the cervical LUNGS: clear to auscultation with normal breathing effort HEART: regular rate & rhythm and no murmurs and no lower extremity edema ABDOMEN: soft, non-tender, non-distended, normal bowel sounds Musculoskeletal: no cyanosis of digits and no clubbing  PSYCH: alert & oriented x 3, fluent speech NEURO: no focal motor/sensory deficits  LABORATORY DATA:  I have reviewed the data as listed    Component Value Date/Time   NA 137 03/15/2019 1030   K 4.6 03/15/2019 1030   CL 102 03/15/2019 1030   CO2 24 03/15/2019 1030   GLUCOSE 247 (H) 03/15/2019 1030   BUN 35 (H) 03/15/2019 1030   CREATININE 1.80 (H) 03/15/2019 1030   CALCIUM 8.6 (L) 03/15/2019 1030   PROT 6.5 03/15/2019 1030   ALBUMIN 4.3 03/15/2019 1030   AST 15 03/15/2019 1030   ALT 41 03/15/2019 1030   ALKPHOS 70 03/15/2019 1030   BILITOT 0.3 03/15/2019 1030   GFRNONAA 42 (L) 03/15/2019 1030   GFRAA 49 (L) 03/15/2019 1030    No results found for: SPEP, UPEP  Lab Results  Component Value Date   WBC 7.5 03/15/2019   NEUTROABS 7.1 03/15/2019   HGB 9.1 (L) 03/15/2019   HCT 28.6 (L) 03/15/2019   MCV 84.9 03/15/2019   PLT 180 03/15/2019      Chemistry      Component Value Date/Time   NA 137 03/15/2019 1030   K 4.6 03/15/2019 1030   CL 102 03/15/2019 1030   CO2 24 03/15/2019 1030   BUN 35 (H) 03/15/2019 1030   CREATININE 1.80 (H) 03/15/2019 1030      Component  Value Date/Time   CALCIUM 8.6 (L) 03/15/2019 1030   ALKPHOS 70 03/15/2019 1030   AST 15 03/15/2019 1030   ALT 41 03/15/2019 1030   BILITOT 0.3 03/15/2019 1030       RADIOGRAPHIC STUDIES: I have personally reviewed the radiological images as listed below and agreed with the findings in the report. Ct Abdomen W Contrast  Result Date: 02/15/2019 CLINICAL DATA:  Followup lymphoma EXAM: CT ABDOMEN WITH CONTRAST TECHNIQUE: Multidetector CT imaging of the abdomen was performed using the standard protocol following bolus administration of intravenous contrast. CONTRAST:  146m OMNIPAQUE IOHEXOL 300 MG/ML  SOLN COMPARISON:  CT AP 12/31/2018 FINDINGS: Lower chest: No acute abnormality. Hepatobiliary: No focal liver abnormality is seen. No gallstones, gallbladder wall thickening, or biliary dilatation. Pancreas: Unremarkable. No pancreatic ductal dilatation or surrounding inflammatory changes. Spleen: Normal in size without focal abnormality. Adrenals/Urinary Tract: Normal appearance of the adrenal glands. Persistent mild bilateral hydronephrosis. Inferior pole left kidney cyst measures 2.2 cm, image 50/2. Stomach/Bowel: Stomach normal. Small bowel loops are unremarkable. There is no pathologic dilatation of the colon. Vascular/Lymphatic: Aortic atherosclerosis. No aneurysm. The portal vein and hepatic veins are patent. The IVC appears patent. The visualized portions of the common iliac veins are also patent. Other: Diffuse soft tissue infiltration throughout the retroperitoneum and mesentery is again noted, improved. Soft tissue mass posterior to the pancreas measures 2.6 x 2.1 by 2.9 cm, image 28/2. Previously 2.7 by 2.7 by 3.7 cm. Retroaortic and periaortic retroperitoneal mass measures 3.6 x 1.6 by 6.4 cm, image 51/2. Previously 4.8 by 3.2 by 7.5 cm. Retroperitoneal mass posterior to scratch set the left retroperitoneal mass posterior  to the upper pole of left kidney measures 1.8 x 0.8 by 2.2 cm, image 30/2.  Previously 3.4 by 2.0 by 4.3 cm. Musculoskeletal: No acute or significant osseous findings. IMPRESSION: 1. No evidence for IVC obstruction. 2. Interval decrease in size of retroperitoneal tumor compared with 12/31/2018. 3. Persistent mild bilateral hydronephrosis. Electronically Signed   By: Kerby Moors M.D.   On: 02/15/2019 13:47   US Venous Img Lower Bilateral  Result Date: 02/13/2019 CLINICAL DATA:  Lower extremity edema EXAM: BILATERAL LOWER EXTREMITY VENOUS DOPPLER ULTRASOUND TECHNIQUE: Gray-scale sonography with graded compression, as well as color Doppler and duplex ultrasound were performed to evaluate the lower extremity deep venous systems from the level of the common femoral vein and including the common femoral, femoral, profunda femoral, popliteal and calf veins including the posterior tibial, peroneal and gastrocnemius veins when visible. The superficial great saphenous vein was also interrogated. Spectral Doppler was utilized to evaluate flow at rest and with distal augmentation maneuvers in the common femoral, femoral and popliteal veins. COMPARISON:  None. FINDINGS: RIGHT LOWER EXTREMITY Common Femoral Vein: No evidence of thrombus. Normal compressibility, respiratory phasicity and response to augmentation. Saphenofemoral Junction: No evidence of thrombus. Normal compressibility and flow on color Doppler imaging. Profunda Femoral Vein: No evidence of thrombus. Normal compressibility and flow on color Doppler imaging. Femoral Vein: No evidence of thrombus. Normal compressibility, respiratory phasicity and response to augmentation. Popliteal Vein: No evidence of thrombus. Normal compressibility, respiratory phasicity and response to augmentation. Calf Veins: No evidence of thrombus. Normal compressibility and flow on color Doppler imaging. Superficial Great Saphenous Vein: No evidence of thrombus. Normal compressibility. LEFT LOWER EXTREMITY Common Femoral Vein: No evidence of thrombus. Normal  compressibility, respiratory phasicity and response to augmentation. Saphenofemoral Junction: No evidence of thrombus. Normal compressibility and flow on color Doppler imaging. Profunda Femoral Vein: No evidence of thrombus. Normal compressibility and flow on color Doppler imaging. Femoral Vein: No evidence of thrombus. Normal compressibility, respiratory phasicity and response to augmentation. Popliteal Vein: No evidence of thrombus. Normal compressibility, respiratory phasicity and response to augmentation. Calf Veins: No evidence of thrombus. Normal compressibility and flow on color Doppler imaging. Superficial Great Saphenous Vein: No evidence of thrombus. Normal compressibility. IMPRESSION: No evidence of deep venous thrombosis. Electronically Signed   By: Donavan Foil M.D.   On: 02/13/2019 19:49

## 2019-03-15 ENCOUNTER — Inpatient Hospital Stay: Payer: BC Managed Care – PPO

## 2019-03-15 ENCOUNTER — Telehealth: Payer: Self-pay | Admitting: Hematology

## 2019-03-15 ENCOUNTER — Other Ambulatory Visit: Payer: Self-pay

## 2019-03-15 ENCOUNTER — Encounter: Payer: Self-pay | Admitting: Hematology

## 2019-03-15 ENCOUNTER — Encounter: Payer: Self-pay | Admitting: Hematology & Oncology

## 2019-03-15 ENCOUNTER — Inpatient Hospital Stay: Payer: BC Managed Care – PPO | Attending: Hematology | Admitting: Hematology

## 2019-03-15 VITALS — BP 154/87 | HR 86 | Temp 98.2°F | Resp 18 | Wt 194.0 lb

## 2019-03-15 DIAGNOSIS — C8338 Diffuse large B-cell lymphoma, lymph nodes of multiple sites: Secondary | ICD-10-CM

## 2019-03-15 DIAGNOSIS — N179 Acute kidney failure, unspecified: Secondary | ICD-10-CM | POA: Diagnosis not present

## 2019-03-15 DIAGNOSIS — Z95828 Presence of other vascular implants and grafts: Secondary | ICD-10-CM

## 2019-03-15 DIAGNOSIS — M79671 Pain in right foot: Secondary | ICD-10-CM | POA: Diagnosis not present

## 2019-03-15 DIAGNOSIS — B370 Candidal stomatitis: Secondary | ICD-10-CM

## 2019-03-15 DIAGNOSIS — Z5112 Encounter for antineoplastic immunotherapy: Secondary | ICD-10-CM | POA: Diagnosis not present

## 2019-03-15 DIAGNOSIS — D6481 Anemia due to antineoplastic chemotherapy: Secondary | ICD-10-CM

## 2019-03-15 DIAGNOSIS — T451X5A Adverse effect of antineoplastic and immunosuppressive drugs, initial encounter: Secondary | ICD-10-CM

## 2019-03-15 DIAGNOSIS — M79604 Pain in right leg: Secondary | ICD-10-CM

## 2019-03-15 DIAGNOSIS — Z5189 Encounter for other specified aftercare: Secondary | ICD-10-CM | POA: Diagnosis not present

## 2019-03-15 DIAGNOSIS — Z5111 Encounter for antineoplastic chemotherapy: Secondary | ICD-10-CM | POA: Diagnosis present

## 2019-03-15 LAB — CBC WITH DIFFERENTIAL (CANCER CENTER ONLY)
Abs Immature Granulocytes: 0.04 10*3/uL (ref 0.00–0.07)
Basophils Absolute: 0 10*3/uL (ref 0.0–0.1)
Basophils Relative: 0 %
Eosinophils Absolute: 0 10*3/uL (ref 0.0–0.5)
Eosinophils Relative: 0 %
HCT: 28.6 % — ABNORMAL LOW (ref 39.0–52.0)
Hemoglobin: 9.1 g/dL — ABNORMAL LOW (ref 13.0–17.0)
Immature Granulocytes: 1 %
Lymphocytes Relative: 4 %
Lymphs Abs: 0.3 10*3/uL — ABNORMAL LOW (ref 0.7–4.0)
MCH: 27 pg (ref 26.0–34.0)
MCHC: 31.8 g/dL (ref 30.0–36.0)
MCV: 84.9 fL (ref 80.0–100.0)
Monocytes Absolute: 0.1 10*3/uL (ref 0.1–1.0)
Monocytes Relative: 1 %
Neutro Abs: 7.1 10*3/uL (ref 1.7–7.7)
Neutrophils Relative %: 94 %
Platelet Count: 180 10*3/uL (ref 150–400)
RBC: 3.37 MIL/uL — ABNORMAL LOW (ref 4.22–5.81)
RDW: 23.8 % — ABNORMAL HIGH (ref 11.5–15.5)
WBC Count: 7.5 10*3/uL (ref 4.0–10.5)
nRBC: 0 % (ref 0.0–0.2)

## 2019-03-15 LAB — CMP (CANCER CENTER ONLY)
ALT: 41 U/L (ref 0–44)
AST: 15 U/L (ref 15–41)
Albumin: 4.3 g/dL (ref 3.5–5.0)
Alkaline Phosphatase: 70 U/L (ref 38–126)
Anion gap: 11 (ref 5–15)
BUN: 35 mg/dL — ABNORMAL HIGH (ref 6–20)
CO2: 24 mmol/L (ref 22–32)
Calcium: 8.6 mg/dL — ABNORMAL LOW (ref 8.9–10.3)
Chloride: 102 mmol/L (ref 98–111)
Creatinine: 1.8 mg/dL — ABNORMAL HIGH (ref 0.61–1.24)
GFR, Est AFR Am: 49 mL/min — ABNORMAL LOW (ref >60)
GFR, Estimated: 42 mL/min — ABNORMAL LOW (ref >60)
Glucose, Bld: 247 mg/dL — ABNORMAL HIGH (ref 70–99)
Potassium: 4.6 mmol/L (ref 3.5–5.1)
Sodium: 137 mmol/L (ref 135–145)
Total Bilirubin: 0.3 mg/dL (ref 0.3–1.2)
Total Protein: 6.5 g/dL (ref 6.5–8.1)

## 2019-03-15 MED ORDER — SODIUM CHLORIDE 0.9% FLUSH
10.0000 mL | Freq: Once | INTRAVENOUS | Status: AC
  Administered 2019-03-15: 10 mL via INTRAVENOUS
  Filled 2019-03-15: qty 10

## 2019-03-15 MED ORDER — HEPARIN SOD (PORK) LOCK FLUSH 100 UNIT/ML IV SOLN
500.0000 [IU] | Freq: Once | INTRAVENOUS | Status: AC | PRN
  Administered 2019-03-15: 500 [IU]
  Filled 2019-03-15: qty 5

## 2019-03-15 MED ORDER — SODIUM CHLORIDE 0.9 % IV SOLN
INTRAVENOUS | Status: DC
  Administered 2019-03-15: 11:00:00 via INTRAVENOUS
  Filled 2019-03-15 (×2): qty 250

## 2019-03-15 MED ORDER — SODIUM CHLORIDE 0.9% FLUSH
10.0000 mL | Freq: Once | INTRAVENOUS | Status: AC | PRN
  Administered 2019-03-15: 14:00:00 10 mL
  Filled 2019-03-15: qty 10

## 2019-03-15 NOTE — Patient Instructions (Signed)

## 2019-03-15 NOTE — Patient Instructions (Signed)
Dehydration, Adult  Dehydration is a condition in which there is not enough fluid or water in the body. This happens when you lose more fluids than you take in. Important organs, such as the kidneys, brain, and heart, cannot function without a proper amount of fluids. Any loss of fluids from the body can lead to dehydration. Dehydration can range from mild to severe. This condition should be treated right away to prevent it from becoming severe. What are the causes? This condition may be caused by:  Vomiting.  Diarrhea.  Excessive sweating, such as from heat exposure or exercise.  Not drinking enough fluid, especially: ? When ill. ? While doing activity that requires a lot of energy.  Excessive urination.  Fever.  Infection.  Certain medicines, such as medicines that cause the body to lose excess fluid (diuretics).  Inability to access safe drinking water.  Reduced physical ability to get adequate water and food. What increases the risk? This condition is more likely to develop in people:  Who have a poorly controlled long-term (chronic) illness, such as diabetes, heart disease, or kidney disease.  Who are age 65 or older.  Who are disabled.  Who live in a place with high altitude.  Who play endurance sports. What are the signs or symptoms? Symptoms of mild dehydration may include:  Thirst.  Dry lips.  Slightly dry mouth.  Dry, warm skin.  Dizziness. Symptoms of moderate dehydration may include:  Very dry mouth.  Muscle cramps.  Dark urine. Urine may be the color of tea.  Decreased urine production.  Decreased tear production.  Heartbeat that is irregular or faster than normal (palpitations).  Headache.  Light-headedness, especially when you stand up from a sitting position.  Fainting (syncope). Symptoms of severe dehydration may include:  Changes in skin, such as: ? Cold and clammy skin. ? Blotchy (mottled) or pale skin. ? Skin that does  not quickly return to normal after being lightly pinched and released (poor skin turgor).  Changes in body fluids, such as: ? Extreme thirst. ? No tear production. ? Inability to sweat when body temperature is high, such as in hot weather. ? Very little urine production.  Changes in vital signs, such as: ? Weak pulse. ? Pulse that is more than 100 beats a minute when sitting still. ? Rapid breathing. ? Low blood pressure.  Other changes, such as: ? Sunken eyes. ? Cold hands and feet. ? Confusion. ? Lack of energy (lethargy). ? Difficulty waking up from sleep. ? Short-term weight loss. ? Unconsciousness. How is this diagnosed? This condition is diagnosed based on your symptoms and a physical exam. Blood and urine tests may be done to help confirm the diagnosis. How is this treated? Treatment for this condition depends on the severity. Mild or moderate dehydration can often be treated at home. Treatment should be started right away. Do not wait until dehydration becomes severe. Severe dehydration is an emergency and it needs to be treated in a hospital. Treatment for mild dehydration may include:  Drinking more fluids.  Replacing salts and minerals in your blood (electrolytes) that you may have lost. Treatment for moderate dehydration may include:  Drinking an oral rehydration solution (ORS). This is a drink that helps you replace fluids and electrolytes (rehydrate). It can be found at pharmacies and retail stores. Treatment for severe dehydration may include:  Receiving fluids through an IV tube.  Receiving an electrolyte solution through a feeding tube that is passed through your nose and   into your stomach (nasogastric tube, or NG tube).  Correcting any abnormalities in electrolytes.  Treating the underlying cause of dehydration. Follow these instructions at home:  If directed by your health care provider, drink an ORS: ? Make an ORS by following instructions on the  package. ? Start by drinking small amounts, about  cup (120 mL) every 5-10 minutes. ? Slowly increase how much you drink until you have taken the amount recommended by your health care provider.  Drink enough clear fluid to keep your urine clear or pale yellow. If you were told to drink an ORS, finish the ORS first, then start slowly drinking other clear fluids. Drink fluids such as: ? Water. Do not drink only water. Doing that can lead to having too little salt (sodium) in the body (hyponatremia). ? Ice chips. ? Fruit juice that you have added water to (diluted fruit juice). ? Low-calorie sports drinks.  Avoid: ? Alcohol. ? Drinks that contain a lot of sugar. These include high-calorie sports drinks, fruit juice that is not diluted, and soda. ? Caffeine. ? Foods that are greasy or contain a lot of fat or sugar.  Take over-the-counter and prescription medicines only as told by your health care provider.  Do not take sodium tablets. This can lead to having too much sodium in the body (hypernatremia).  Eat foods that contain a healthy balance of electrolytes, such as bananas, oranges, potatoes, tomatoes, and spinach.  Keep all follow-up visits as told by your health care provider. This is important. Contact a health care provider if:  You have abdominal pain that: ? Gets worse. ? Stays in one area (localizes).  You have a rash.  You have a stiff neck.  You are more irritable than usual.  You are sleepier or more difficult to wake up than usual.  You feel weak or dizzy.  You feel very thirsty.  You have urinated only a small amount of very dark urine over 6-8 hours. Get help right away if:  You have symptoms of severe dehydration.  You cannot drink fluids without vomiting.  Your symptoms get worse with treatment.  You have a fever.  You have a severe headache.  You have vomiting or diarrhea that: ? Gets worse. ? Does not go away.  You have blood or green matter  (bile) in your vomit.  You have blood in your stool. This may cause stool to look black and tarry.  You have not urinated in 6-8 hours.  You faint.  Your heart rate while sitting still is over 100 beats a minute.  You have trouble breathing. This information is not intended to replace advice given to you by your health care provider. Make sure you discuss any questions you have with your health care provider. Document Released: 11/29/2005 Document Revised: 06/25/2016 Document Reviewed: 01/23/2016 Elsevier Interactive Patient Education  2019 [employer]  

## 2019-03-15 NOTE — Telephone Encounter (Signed)
As scheduled per 4/2 los

## 2019-03-16 ENCOUNTER — Inpatient Hospital Stay: Payer: BC Managed Care – PPO

## 2019-03-16 ENCOUNTER — Other Ambulatory Visit: Payer: Self-pay

## 2019-03-16 VITALS — BP 162/89 | HR 81 | Temp 98.1°F | Resp 18

## 2019-03-16 DIAGNOSIS — C8338 Diffuse large B-cell lymphoma, lymph nodes of multiple sites: Secondary | ICD-10-CM

## 2019-03-16 DIAGNOSIS — Z5112 Encounter for antineoplastic immunotherapy: Secondary | ICD-10-CM | POA: Diagnosis not present

## 2019-03-16 MED ORDER — SODIUM CHLORIDE 0.9 % IV SOLN
Freq: Once | INTRAVENOUS | Status: AC
  Administered 2019-03-16: 13:00:00 via INTRAVENOUS
  Filled 2019-03-16: qty 250

## 2019-03-16 MED ORDER — HEPARIN SOD (PORK) LOCK FLUSH 100 UNIT/ML IV SOLN
500.0000 [IU] | Freq: Once | INTRAVENOUS | Status: AC | PRN
  Administered 2019-03-16: 500 [IU]
  Filled 2019-03-16: qty 5

## 2019-03-16 MED ORDER — SODIUM CHLORIDE 0.9% FLUSH
10.0000 mL | Freq: Once | INTRAVENOUS | Status: AC | PRN
  Administered 2019-03-16: 15:00:00 10 mL
  Filled 2019-03-16: qty 10

## 2019-03-16 NOTE — Patient Instructions (Signed)
Dehydration, Adult  Dehydration is a condition in which there is not enough fluid or water in the body. This happens when you lose more fluids than you take in. Important organs, such as the kidneys, brain, and heart, cannot function without a proper amount of fluids. Any loss of fluids from the body can lead to dehydration. Dehydration can range from mild to severe. This condition should be treated right away to prevent it from becoming severe. What are the causes? This condition may be caused by:  Vomiting.  Diarrhea.  Excessive sweating, such as from heat exposure or exercise.  Not drinking enough fluid, especially: ? When ill. ? While doing activity that requires a lot of energy.  Excessive urination.  Fever.  Infection.  Certain medicines, such as medicines that cause the body to lose excess fluid (diuretics).  Inability to access safe drinking water.  Reduced physical ability to get adequate water and food. What increases the risk? This condition is more likely to develop in people:  Who have a poorly controlled long-term (chronic) illness, such as diabetes, heart disease, or kidney disease.  Who are age 65 or older.  Who are disabled.  Who live in a place with high altitude.  Who play endurance sports. What are the signs or symptoms? Symptoms of mild dehydration may include:  Thirst.  Dry lips.  Slightly dry mouth.  Dry, warm skin.  Dizziness. Symptoms of moderate dehydration may include:  Very dry mouth.  Muscle cramps.  Dark urine. Urine may be the color of tea.  Decreased urine production.  Decreased tear production.  Heartbeat that is irregular or faster than normal (palpitations).  Headache.  Light-headedness, especially when you stand up from a sitting position.  Fainting (syncope). Symptoms of severe dehydration may include:  Changes in skin, such as: ? Cold and clammy skin. ? Blotchy (mottled) or pale skin. ? Skin that does  not quickly return to normal after being lightly pinched and released (poor skin turgor).  Changes in body fluids, such as: ? Extreme thirst. ? No tear production. ? Inability to sweat when body temperature is high, such as in hot weather. ? Very little urine production.  Changes in vital signs, such as: ? Weak pulse. ? Pulse that is more than 100 beats a minute when sitting still. ? Rapid breathing. ? Low blood pressure.  Other changes, such as: ? Sunken eyes. ? Cold hands and feet. ? Confusion. ? Lack of energy (lethargy). ? Difficulty waking up from sleep. ? Short-term weight loss. ? Unconsciousness. How is this diagnosed? This condition is diagnosed based on your symptoms and a physical exam. Blood and urine tests may be done to help confirm the diagnosis. How is this treated? Treatment for this condition depends on the severity. Mild or moderate dehydration can often be treated at home. Treatment should be started right away. Do not wait until dehydration becomes severe. Severe dehydration is an emergency and it needs to be treated in a hospital. Treatment for mild dehydration may include:  Drinking more fluids.  Replacing salts and minerals in your blood (electrolytes) that you may have lost. Treatment for moderate dehydration may include:  Drinking an oral rehydration solution (ORS). This is a drink that helps you replace fluids and electrolytes (rehydrate). It can be found at pharmacies and retail stores. Treatment for severe dehydration may include:  Receiving fluids through an IV tube.  Receiving an electrolyte solution through a feeding tube that is passed through your nose and   into your stomach (nasogastric tube, or NG tube).  Correcting any abnormalities in electrolytes.  Treating the underlying cause of dehydration. Follow these instructions at home:  If directed by your health care provider, drink an ORS: ? Make an ORS by following instructions on the  package. ? Start by drinking small amounts, about  cup (120 mL) every 5-10 minutes. ? Slowly increase how much you drink until you have taken the amount recommended by your health care provider.  Drink enough clear fluid to keep your urine clear or pale yellow. If you were told to drink an ORS, finish the ORS first, then start slowly drinking other clear fluids. Drink fluids such as: ? Water. Do not drink only water. Doing that can lead to having too little salt (sodium) in the body (hyponatremia). ? Ice chips. ? Fruit juice that you have added water to (diluted fruit juice). ? Low-calorie sports drinks.  Avoid: ? Alcohol. ? Drinks that contain a lot of sugar. These include high-calorie sports drinks, fruit juice that is not diluted, and soda. ? Caffeine. ? Foods that are greasy or contain a lot of fat or sugar.  Take over-the-counter and prescription medicines only as told by your health care provider.  Do not take sodium tablets. This can lead to having too much sodium in the body (hypernatremia).  Eat foods that contain a healthy balance of electrolytes, such as bananas, oranges, potatoes, tomatoes, and spinach.  Keep all follow-up visits as told by your health care provider. This is important. Contact a health care provider if:  You have abdominal pain that: ? Gets worse. ? Stays in one area (localizes).  You have a rash.  You have a stiff neck.  You are more irritable than usual.  You are sleepier or more difficult to wake up than usual.  You feel weak or dizzy.  You feel very thirsty.  You have urinated only a small amount of very dark urine over 6-8 hours. Get help right away if:  You have symptoms of severe dehydration.  You cannot drink fluids without vomiting.  Your symptoms get worse with treatment.  You have a fever.  You have a severe headache.  You have vomiting or diarrhea that: ? Gets worse. ? Does not go away.  You have blood or green matter  (bile) in your vomit.  You have blood in your stool. This may cause stool to look black and tarry.  You have not urinated in 6-8 hours.  You faint.  Your heart rate while sitting still is over 100 beats a minute.  You have trouble breathing. This information is not intended to replace advice given to you by your health care provider. Make sure you discuss any questions you have with your health care provider. Document Released: 11/29/2005 Document Revised: 06/25/2016 Document Reviewed: 01/23/2016 Elsevier Interactive Patient Education  2019 [employer]  

## 2019-03-17 LAB — METHOTREXATE: Methotrexate: <0.02 umol/L — ABNORMAL LOW (ref 0.02–5.00)

## 2019-03-17 LAB — MISC LABCORP TEST (SEND OUT)

## 2019-03-19 ENCOUNTER — Other Ambulatory Visit: Payer: Self-pay

## 2019-03-19 ENCOUNTER — Inpatient Hospital Stay: Payer: BC Managed Care – PPO

## 2019-03-19 VITALS — BP 152/82 | HR 86 | Temp 98.3°F | Resp 18

## 2019-03-19 DIAGNOSIS — Z5112 Encounter for antineoplastic immunotherapy: Secondary | ICD-10-CM | POA: Diagnosis not present

## 2019-03-19 DIAGNOSIS — C8338 Diffuse large B-cell lymphoma, lymph nodes of multiple sites: Secondary | ICD-10-CM

## 2019-03-19 MED ORDER — SODIUM CHLORIDE 0.9% FLUSH
10.0000 mL | Freq: Once | INTRAVENOUS | Status: AC | PRN
  Administered 2019-03-19: 15:00:00 10 mL
  Filled 2019-03-19: qty 10

## 2019-03-19 MED ORDER — SODIUM CHLORIDE 0.9 % IV SOLN
Freq: Once | INTRAVENOUS | Status: AC
  Administered 2019-03-19: 13:00:00 via INTRAVENOUS
  Filled 2019-03-19: qty 250

## 2019-03-19 MED ORDER — HEPARIN SOD (PORK) LOCK FLUSH 100 UNIT/ML IV SOLN
500.0000 [IU] | Freq: Once | INTRAVENOUS | Status: AC | PRN
  Administered 2019-03-19: 500 [IU]
  Filled 2019-03-19: qty 5

## 2019-03-19 NOTE — Patient Instructions (Signed)

## 2019-03-20 ENCOUNTER — Other Ambulatory Visit: Payer: Self-pay

## 2019-03-20 ENCOUNTER — Other Ambulatory Visit: Payer: Self-pay | Admitting: Hematology

## 2019-03-20 ENCOUNTER — Inpatient Hospital Stay: Payer: BC Managed Care – PPO

## 2019-03-20 VITALS — BP 173/87 | HR 93 | Temp 98.8°F | Resp 19

## 2019-03-20 DIAGNOSIS — C8338 Diffuse large B-cell lymphoma, lymph nodes of multiple sites: Secondary | ICD-10-CM

## 2019-03-20 DIAGNOSIS — Z5112 Encounter for antineoplastic immunotherapy: Secondary | ICD-10-CM | POA: Diagnosis not present

## 2019-03-20 LAB — CBC WITH DIFFERENTIAL (CANCER CENTER ONLY)
Abs Immature Granulocytes: 0.03 10*3/uL (ref 0.00–0.07)
Basophils Absolute: 0 10*3/uL (ref 0.0–0.1)
Basophils Relative: 0 %
Eosinophils Absolute: 0 10*3/uL (ref 0.0–0.5)
Eosinophils Relative: 0 %
HCT: 32.6 % — ABNORMAL LOW (ref 39.0–52.0)
Hemoglobin: 10.5 g/dL — ABNORMAL LOW (ref 13.0–17.0)
Immature Granulocytes: 0 %
Lymphocytes Relative: 2 %
Lymphs Abs: 0.2 10*3/uL — ABNORMAL LOW (ref 0.7–4.0)
MCH: 27.5 pg (ref 26.0–34.0)
MCHC: 32.2 g/dL (ref 30.0–36.0)
MCV: 85.3 fL (ref 80.0–100.0)
Monocytes Absolute: 0 10*3/uL — ABNORMAL LOW (ref 0.1–1.0)
Monocytes Relative: 1 %
Neutro Abs: 7.5 10*3/uL (ref 1.7–7.7)
Neutrophils Relative %: 97 %
Platelet Count: 207 10*3/uL (ref 150–400)
RBC: 3.82 MIL/uL — ABNORMAL LOW (ref 4.22–5.81)
RDW: 23.9 % — ABNORMAL HIGH (ref 11.5–15.5)
WBC Count: 7.7 10*3/uL (ref 4.0–10.5)
nRBC: 0 % (ref 0.0–0.2)

## 2019-03-20 LAB — CMP (CANCER CENTER ONLY)
ALT: 18 U/L (ref 0–44)
AST: 11 U/L — ABNORMAL LOW (ref 15–41)
Albumin: 4.6 g/dL (ref 3.5–5.0)
Alkaline Phosphatase: 64 U/L (ref 38–126)
Anion gap: 12 (ref 5–15)
BUN: 32 mg/dL — ABNORMAL HIGH (ref 6–20)
CO2: 24 mmol/L (ref 22–32)
Calcium: 8.8 mg/dL — ABNORMAL LOW (ref 8.9–10.3)
Chloride: 102 mmol/L (ref 98–111)
Creatinine: 1.48 mg/dL — ABNORMAL HIGH (ref 0.61–1.24)
GFR, Est AFR Am: >60 mL/min (ref >60)
GFR, Estimated: 54 mL/min — ABNORMAL LOW (ref >60)
Glucose, Bld: 261 mg/dL — ABNORMAL HIGH (ref 70–99)
Potassium: 4.8 mmol/L (ref 3.5–5.1)
Sodium: 138 mmol/L (ref 135–145)
Total Bilirubin: 0.3 mg/dL (ref 0.3–1.2)
Total Protein: 6.8 g/dL (ref 6.5–8.1)

## 2019-03-20 MED ORDER — HEPARIN SOD (PORK) LOCK FLUSH 100 UNIT/ML IV SOLN
500.0000 [IU] | Freq: Once | INTRAVENOUS | Status: AC | PRN
  Administered 2019-03-20: 500 [IU]
  Filled 2019-03-20: qty 5

## 2019-03-20 MED ORDER — SODIUM CHLORIDE 0.9% FLUSH
10.0000 mL | Freq: Once | INTRAVENOUS | Status: AC | PRN
  Administered 2019-03-20: 10 mL
  Filled 2019-03-20: qty 10

## 2019-03-20 MED ORDER — SODIUM CHLORIDE 0.9 % IV SOLN
Freq: Once | INTRAVENOUS | Status: AC
  Administered 2019-03-20: 13:00:00 via INTRAVENOUS
  Filled 2019-03-20: qty 250

## 2019-03-20 NOTE — Patient Instructions (Addendum)
Dehydration, Adult  Dehydration is when there is not enough fluid or water in your body. This happens when you lose more fluids than you take in. Dehydration can range from mild to very bad. It should be treated right away to keep it from getting very bad. Symptoms of mild dehydration may include:  Thirst.  Dry lips.  Slightly dry mouth.  Dry, warm skin.  Dizziness. Symptoms of moderate dehydration may include:  Very dry mouth.  Muscle cramps.  Dark pee (urine). Pee may be the color of tea.  Your body making less pee.  Your eyes making fewer tears.  Heartbeat that is uneven or faster than normal (palpitations).  Headache.  Light-headedness, especially when you stand up from sitting.  Fainting (syncope). Symptoms of very bad dehydration may include:  Changes in skin, such as: ? Cold and clammy skin. ? Blotchy (mottled) or pale skin. ? Skin that does not quickly return to normal after being lightly pinched and let go (poor skin turgor).  Changes in body fluids, such as: ? Feeling very thirsty. ? Your eyes making fewer tears. ? Not sweating when body temperature is high, such as in hot weather. ? Your body making very little pee.  Changes in vital signs, such as: ? Weak pulse. ? Pulse that is more than 100 beats a minute when you are sitting still. ? Fast breathing. ? Low blood pressure.  Other changes, such as: ? Sunken eyes. ? Cold hands and feet. ? Confusion. ? Lack of energy (lethargy). ? Trouble waking up from sleep. ? Short-term weight loss. ? Unconsciousness. Follow these instructions at home:   If told by your doctor, drink an ORS: ? Make an ORS by using instructions on the package. ? Start by drinking small amounts, about  cup (120 mL) every 5-10 minutes. ? Slowly drink more until you have had the amount that your doctor said to have.  Drink enough clear fluid to keep your pee clear or pale yellow. If you were told to drink an ORS, finish the  ORS first, then start slowly drinking clear fluids. Drink fluids such as: ? Water. Do not drink only water by itself. Doing that can make the salt (sodium) level in your body get too low (hyponatremia). ? Ice chips. ? Fruit juice that you have added water to (diluted). ? Low-calorie sports drinks.  Avoid: ? Alcohol. ? Drinks that have a lot of sugar. These include high-calorie sports drinks, fruit juice that does not have water added, and soda. ? Caffeine. ? Foods that are greasy or have a lot of fat or sugar.  Take over-the-counter and prescription medicines only as told by your doctor.  Do not take salt tablets. Doing that can make the salt level in your body get too high (hypernatremia).  Eat foods that have minerals (electrolytes). Examples include bananas, oranges, potatoes, tomatoes, and spinach.  Keep all follow-up visits as told by your doctor. This is important. Contact a doctor if:  You have belly (abdominal) pain that: ? Gets worse. ? Stays in one area (localizes).  You have a rash.  You have a stiff neck.  You get angry or annoyed more easily than normal (irritability).  You are more sleepy than normal.  You have a harder time waking up than normal.  You feel: ? Weak. ? Dizzy. ? Very thirsty.  You have peed (urinated) only a small amount of very dark pee during 6-8 hours. Get help right away if:  You have   symptoms of very bad dehydration.  You cannot drink fluids without throwing up (vomiting).  Your symptoms get worse with treatment.  You have a fever.  You have a very bad headache.  You are throwing up or having watery poop (diarrhea) and it: ? Gets worse. ? Does not go away.  You have blood or something green (bile) in your throw-up.  You have blood in your poop (stool). This may cause poop to look black and tarry.  You have not peed in 6-8 hours.  You pass out (faint).  Your heart rate when you are sitting still is more than 100 beats a  minute.  You have trouble breathing. This information is not intended to replace advice given to you by your health care provider. Make sure you discuss any questions you have with your health care provider. Document Released: 09/25/2009 Document Revised: 06/18/2016 Document Reviewed: 01/23/2016 Elsevier Interactive Patient Education  2019 [employer]  Dehydration, Adult  Dehydration is when there is not enough fluid or water in your body. This happens when you lose more fluids than you take in. Dehydration can range from mild to very bad. It should be treated right away to keep it from getting very bad. Symptoms of mild dehydration may include:  Thirst.  Dry lips.  Slightly dry mouth.  Dry, warm skin.  Dizziness. Symptoms of moderate dehydration may include:  Very dry mouth.  Muscle cramps.  Dark pee (urine). Pee may be the color of tea.  Your body making less pee.  Your eyes making fewer tears.  Heartbeat that is uneven or faster than normal (palpitations).  Headache.  Light-headedness, especially when you stand up from sitting.  Fainting (syncope). Symptoms of very bad dehydration may include:  Changes in skin, such as: ? Cold and clammy skin. ? Blotchy (mottled) or pale skin. ? Skin that does not quickly return to normal after being lightly pinched and let go (poor skin turgor).  Changes in body fluids, such as: ? Feeling very thirsty. ? Your eyes making fewer tears. ? Not sweating when body temperature is high, such as in hot weather. ? Your body making very little pee.  Changes in vital signs, such as: ? Weak pulse. ? Pulse that is more than 100 beats a minute when you are sitting still. ? Fast breathing. ? Low blood pressure.  Other changes, such as: ? Sunken eyes. ? Cold hands and feet. ? Confusion. ? Lack of energy (lethargy). ? Trouble waking up from sleep. ? Short-term weight loss. ? Unconsciousness. Follow these instructions at home:    If told by your doctor, drink an ORS: ? Make an ORS by using instructions on the package. ? Start by drinking small amounts, about  cup (120 mL) every 5-10 minutes. ? Slowly drink more until you have had the amount that your doctor said to have.  Drink enough clear fluid to keep your pee clear or pale yellow. If you were told to drink an ORS, finish the ORS first, then start slowly drinking clear fluids. Drink fluids such as: ? Water. Do not drink only water by itself. Doing that can make the salt (sodium) level in your body get too low (hyponatremia). ? Ice chips. ? Fruit juice that you have added water to (diluted). ? Low-calorie sports drinks.  Avoid: ? Alcohol. ? Drinks that have a lot of sugar. These include high-calorie sports drinks, fruit juice that does not have water added, and soda. ? Caffeine. ? Foods that  are greasy or have a lot of fat or sugar.  Take over-the-counter and prescription medicines only as told by your doctor.  Do not take salt tablets. Doing that can make the salt level in your body get too high (hypernatremia).  Eat foods that have minerals (electrolytes). Examples include bananas, oranges, potatoes, tomatoes, and spinach.  Keep all follow-up visits as told by your doctor. This is important. Contact a doctor if:  You have belly (abdominal) pain that: ? Gets worse. ? Stays in one area (localizes).  You have a rash.  You have a stiff neck.  You get angry or annoyed more easily than normal (irritability).  You are more sleepy than normal.  You have a harder time waking up than normal.  You feel: ? Weak. ? Dizzy. ? Very thirsty.  You have peed (urinated) only a small amount of very dark pee during 6-8 hours. Get help right away if:  You have symptoms of very bad dehydration.  You cannot drink fluids without throwing up (vomiting).  Your symptoms get worse with treatment.  You have a fever.  You have a very bad headache.  You are  throwing up or having watery poop (diarrhea) and it: ? Gets worse. ? Does not go away.  You have blood or something green (bile) in your throw-up.  You have blood in your poop (stool). This may cause poop to look black and tarry.  You have not peed in 6-8 hours.  You pass out (faint).  Your heart rate when you are sitting still is more than 100 beats a minute.  You have trouble breathing. This information is not intended to replace advice given to you by your health care provider. Make sure you discuss any questions you have with your health care provider. Document Released: 09/25/2009 Document Revised: 06/18/2016 Document Reviewed: 01/23/2016 Elsevier Interactive Patient Education  2019 [employer].

## 2019-03-22 ENCOUNTER — Encounter: Payer: Self-pay | Admitting: Hematology

## 2019-03-22 ENCOUNTER — Inpatient Hospital Stay: Payer: BC Managed Care – PPO

## 2019-03-22 ENCOUNTER — Other Ambulatory Visit: Payer: Self-pay

## 2019-03-22 ENCOUNTER — Telehealth: Payer: Self-pay | Admitting: Hematology

## 2019-03-22 ENCOUNTER — Inpatient Hospital Stay (HOSPITAL_BASED_OUTPATIENT_CLINIC_OR_DEPARTMENT_OTHER): Payer: BC Managed Care – PPO | Admitting: Hematology

## 2019-03-22 VITALS — BP 143/91 | HR 72

## 2019-03-22 VITALS — BP 161/90 | HR 85 | Temp 97.7°F | Resp 18 | Wt 196.0 lb

## 2019-03-22 DIAGNOSIS — B370 Candidal stomatitis: Secondary | ICD-10-CM

## 2019-03-22 DIAGNOSIS — C8338 Diffuse large B-cell lymphoma, lymph nodes of multiple sites: Secondary | ICD-10-CM

## 2019-03-22 DIAGNOSIS — D6481 Anemia due to antineoplastic chemotherapy: Secondary | ICD-10-CM | POA: Diagnosis not present

## 2019-03-22 DIAGNOSIS — Z5112 Encounter for antineoplastic immunotherapy: Secondary | ICD-10-CM | POA: Diagnosis not present

## 2019-03-22 DIAGNOSIS — N179 Acute kidney failure, unspecified: Secondary | ICD-10-CM

## 2019-03-22 DIAGNOSIS — M79604 Pain in right leg: Secondary | ICD-10-CM

## 2019-03-22 DIAGNOSIS — T451X5A Adverse effect of antineoplastic and immunosuppressive drugs, initial encounter: Secondary | ICD-10-CM | POA: Diagnosis not present

## 2019-03-22 DIAGNOSIS — M25571 Pain in right ankle and joints of right foot: Secondary | ICD-10-CM

## 2019-03-22 LAB — CMP (CANCER CENTER ONLY)
ALT: 17 U/L (ref 0–44)
AST: 11 U/L — ABNORMAL LOW (ref 15–41)
Albumin: 4.4 g/dL (ref 3.5–5.0)
Alkaline Phosphatase: 66 U/L (ref 38–126)
Anion gap: 9 (ref 5–15)
BUN: 30 mg/dL — ABNORMAL HIGH (ref 6–20)
CO2: 25 mmol/L (ref 22–32)
Calcium: 8.5 mg/dL — ABNORMAL LOW (ref 8.9–10.3)
Chloride: 105 mmol/L (ref 98–111)
Creatinine: 1.28 mg/dL — ABNORMAL HIGH (ref 0.61–1.24)
GFR, Est AFR Am: >60 mL/min (ref >60)
GFR, Estimated: >60 mL/min (ref >60)
Glucose, Bld: 116 mg/dL — ABNORMAL HIGH (ref 70–99)
Potassium: 4.2 mmol/L (ref 3.5–5.1)
Sodium: 139 mmol/L (ref 135–145)
Total Bilirubin: 0.3 mg/dL (ref 0.3–1.2)
Total Protein: 6.4 g/dL — ABNORMAL LOW (ref 6.5–8.1)

## 2019-03-22 LAB — CBC WITH DIFFERENTIAL (CANCER CENTER ONLY)
Abs Immature Granulocytes: 0.03 10*3/uL (ref 0.00–0.07)
Basophils Absolute: 0 10*3/uL (ref 0.0–0.1)
Basophils Relative: 0 %
Eosinophils Absolute: 0 10*3/uL (ref 0.0–0.5)
Eosinophils Relative: 0 %
HCT: 30.3 % — ABNORMAL LOW (ref 39.0–52.0)
Hemoglobin: 9.6 g/dL — ABNORMAL LOW (ref 13.0–17.0)
Immature Granulocytes: 0 %
Lymphocytes Relative: 6 %
Lymphs Abs: 0.4 10*3/uL — ABNORMAL LOW (ref 0.7–4.0)
MCH: 27.4 pg (ref 26.0–34.0)
MCHC: 31.7 g/dL (ref 30.0–36.0)
MCV: 86.6 fL (ref 80.0–100.0)
Monocytes Absolute: 0.2 10*3/uL (ref 0.1–1.0)
Monocytes Relative: 3 %
Neutro Abs: 6.5 10*3/uL (ref 1.7–7.7)
Neutrophils Relative %: 91 %
Platelet Count: 209 10*3/uL (ref 150–400)
RBC: 3.5 MIL/uL — ABNORMAL LOW (ref 4.22–5.81)
RDW: 23.9 % — ABNORMAL HIGH (ref 11.5–15.5)
WBC Count: 7.2 10*3/uL (ref 4.0–10.5)
nRBC: 0 % (ref 0.0–0.2)

## 2019-03-22 MED ORDER — DEXAMETHASONE SODIUM PHOSPHATE 10 MG/ML IJ SOLN
INTRAMUSCULAR | Status: AC
  Filled 2019-03-22: qty 1

## 2019-03-22 MED ORDER — HEPARIN SOD (PORK) LOCK FLUSH 100 UNIT/ML IV SOLN
500.0000 [IU] | Freq: Once | INTRAVENOUS | Status: AC | PRN
  Administered 2019-03-22: 500 [IU]
  Filled 2019-03-22: qty 5

## 2019-03-22 MED ORDER — SODIUM CHLORIDE 0.9% FLUSH
10.0000 mL | INTRAVENOUS | Status: DC | PRN
  Administered 2019-03-22: 10 mL
  Filled 2019-03-22: qty 10

## 2019-03-22 MED ORDER — SODIUM CHLORIDE 0.9 % IV SOLN
Freq: Once | INTRAVENOUS | Status: AC
  Administered 2019-03-22: 10:00:00 via INTRAVENOUS
  Filled 2019-03-22: qty 250

## 2019-03-22 MED ORDER — PALONOSETRON HCL INJECTION 0.25 MG/5ML
0.2500 mg | Freq: Once | INTRAVENOUS | Status: AC
  Administered 2019-03-22: 0.25 mg via INTRAVENOUS

## 2019-03-22 MED ORDER — DEXAMETHASONE SODIUM PHOSPHATE 10 MG/ML IJ SOLN
10.0000 mg | Freq: Once | INTRAMUSCULAR | Status: AC
  Administered 2019-03-22: 10:00:00 10 mg via INTRAVENOUS

## 2019-03-22 MED ORDER — ACETAMINOPHEN 325 MG PO TABS
ORAL_TABLET | ORAL | Status: AC
  Filled 2019-03-22: qty 2

## 2019-03-22 MED ORDER — DICLOFENAC SODIUM 1 % TD GEL: 2.0000 g | Freq: Four times a day (QID) | TRANSDERMAL | 5 refills | Status: AC

## 2019-03-22 MED ORDER — VINCRISTINE SULFATE CHEMO INJECTION 1 MG/ML
2.0000 mg | Freq: Once | INTRAVENOUS | Status: AC
  Administered 2019-03-22: 2 mg via INTRAVENOUS
  Filled 2019-03-22: qty 2

## 2019-03-22 MED ORDER — SODIUM CHLORIDE 0.9 % IV SOLN
375.0000 mg/m2 | Freq: Once | INTRAVENOUS | Status: AC
  Administered 2019-03-22: 800 mg via INTRAVENOUS
  Filled 2019-03-22: qty 50

## 2019-03-22 MED ORDER — DIPHENHYDRAMINE HCL 25 MG PO CAPS
50.0000 mg | ORAL_CAPSULE | Freq: Once | ORAL | Status: AC
  Administered 2019-03-22: 50 mg via ORAL

## 2019-03-22 MED ORDER — DOXORUBICIN HCL CHEMO IV INJECTION 2 MG/ML
49.0000 mg/m2 | Freq: Once | INTRAVENOUS | Status: AC
  Administered 2019-03-22: 13:00:00 100 mg via INTRAVENOUS
  Filled 2019-03-22: qty 50

## 2019-03-22 MED ORDER — DIPHENHYDRAMINE HCL 25 MG PO CAPS
ORAL_CAPSULE | ORAL | Status: AC
  Filled 2019-03-22: qty 2

## 2019-03-22 MED ORDER — PALONOSETRON HCL INJECTION 0.25 MG/5ML
INTRAVENOUS | Status: AC
  Filled 2019-03-22: qty 5

## 2019-03-22 MED ORDER — ACETAMINOPHEN 325 MG PO TABS
650.0000 mg | ORAL_TABLET | Freq: Once | ORAL | Status: AC
  Administered 2019-03-22: 650 mg via ORAL

## 2019-03-22 MED ORDER — SODIUM CHLORIDE 0.9 % IV SOLN
750.0000 mg/m2 | Freq: Once | INTRAVENOUS | Status: AC
  Administered 2019-03-22: 1540 mg via INTRAVENOUS
  Filled 2019-03-22: qty 77

## 2019-03-22 MED ORDER — PREDNISONE 20 MG PO TABS: 100.0000 mg | ORAL_TABLET | Freq: Every day | ORAL | 4 refills | Status: AC

## 2019-03-22 NOTE — Progress Notes (Signed)
[place] OFFICE PROGRESS NOTE  Patient Care Team: Maury Dus, MD as PCP - General (Family Medicine)  HEME/ONC OVERVIEW: 1. 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, GCB subtype; poor prognosis by R-IPI; intermediate risk by CNS-IPI  -12/2018: CT showed cervical lymphadenopathy (left supraclavicular LN ~3 x 1.5cm), abnormal soft tissue masses surrounding bilateral kidneys (R 4.0 x 3.2cm; L 3 x 2.5cm) encasing the ureters, RP adenopathy (largest 3.4cm), and R internal iliac lymphadenopathy (conglomerate 4.5 x 2.5cm) -01/2019:   PET showed extensive FDG-avid adenopathy in the cervical, paratracheal, peri-aortic, mesenteric and iliac LN's, as well as extensive skeletal involvement throughout the axial and appendicular skeleton; expansile soft tissue lesions in the left scapula and right sacrum  L supraclavicular LN bx showed DLBCL, Ki-67 70-80%, GCB subtype; FISH positive for Bcl-2 rearrangement (41.5%) but negative for Bcl-6 and Myc rearrangement   Normal LVEF on TTE   MRI brain negative for any CNS involvement -Mid-01/2019 - present: R-CHOP with Udenyca, plan for 6 cycles  -02/2019 - present: HD MTX with Cycles 2, 4 and 6 of R-CHOP at [facility]   2. Port placement in 01/2019   TREATMENT REGIMEN:  02/01/2019 - present: R-CHOP with Udenyca, plan for 6 cycles  IT MTX with Cycle 1; HD MTX with Cycles 2, 4 and 6 of chemotherapy at [place]:   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; GCB subtype  -S/p 2 cycles of R-CHOP with CNS prophylaxis -Labs adequate today, proceed with Cycle 3 of chemotherapy -I have ordered PET scan after the 3rd cycle of R-CHOP to assess interim response, currently scheduled on 04/03/2019  -PRN anti-emetics: Zofran, Compazine, Ativan and dexamethasone -Prophylaxis: allopurinol,   CNS prophylaxis -S/p 1 cycle of IT and HD MTX  -Due to the recent renal injury from HD MTX, I have discussed the case with  Dr. Cassell Clement at [facility], who likely reduce the dose of subsequent HD MTX   AKI -Secondary to HD MTX -MTX level < 0.02 last week -Cr peaked at 1.8, and has improved with IV fluid hydration -Cr 1.28 today, electrolytes normal -As discussed above, Dr. Cassell Clement plans to reduce the dose of subsequent high-dose methotrexate -I verified with pharmacy, who confirmed that no dose adjustment is needed for chemotherapy today -I also encouraged patient to maintain adequate oral hydration  Chemotherapy-associated anemia -Secondary to chemotherapy -Hgb 9.6, stable -Patient denies any symptom of bleeding -We will monitor for now; no indication for dose adjustment -If anemia worsens in the future, we will consider delaying chemotherapy or adjusting chemotherapy dose  R lower extremity pain  -No acute abnormality on recent CT -Overall improving with chemotherapy -I recommended maintaining ROM exercises to improve mobility and PRN ACE wraps for swelling -In the absence of clinical evidence for infection or impending fracture, imaging studies are unlikely to change management while receiving chemotherapy for high-grade lymphoma; therefore, we will defer further imaging studies for now  -Continue gabapentin and PRN tramadol  Oral candidiasis -On maintenance fluconazole during treatment due to recurrent oral candidiasis   No orders of the defined types were placed in this encounter.  All questions were answered. The patient knows to call the clinic with any problems, questions or concerns. No barriers to learning was detected.  Return in 3 weeks for labs, port flush, imaging results and clinic appointment prior to Cycle 4 of R-CHOP.  Tish Men, MD 03/22/2019 9:40 AM  CHIEF COMPLAINT: "I am doing okay today"  INTERVAL HISTORY:  Paul Mason returns to clinic for follow-up of DLBCL on 1st line R-CHOP.  He reports that he has been drinking plenty of water to maintain adequate hydration since last week.   He has been urinating without any difficulty.  He had 2 episodes of sporadic night sweats over the last 2 weeks, but overall the frequency of night sweats has decreased significantly since starting treatment.  He still has mild swelling in the right ankle that limits his mobility, but overall the pain in the right lower extremity and swallowing have improved significantly.  He denies any fever, chill, night sweats, weight change, chest pain, dyspnea, abdominal pain, nausea, vomiting, or diarrhea.  SUMMARY OF ONCOLOGIC HISTORY:   Diffuse large B-cell lymphoma ([place])   12/31/2018 Imaging    CT neck: IMPRESSION: Enlarged cervical lymph nodes in the neck as above. Most prominent nodes are in the left supraclavicular region. This may be due to metastatic disease or lymphoma. No primary head and neck cancer identified.    12/31/2018 Imaging    CTA chest: IMPRESSION: 1. 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 Abnormal soft tissue associated with both kidneys and surrounding the proximal left ureter is also likely due to lymphoma. This soft tissue associated with the kidneys and left ureter results in mild bilateral hydronephrosis, left greater than right. 2. Small pleural effusions, mild pulmonary edema, ascites, and fluid and stranding in the mesentery is all likely due to third spacing of fluid. The mesenteric findings surrounds the pancreas but is probably part of the broader process of fluid third-spacing. Recommend clinical correlation to exclude signs of pancreatitis. If there is any concern for pancreatitis, recommend obtaining a lipase. 3. Small pulmonary nodules as above. 4. No pulmonary emboli. 5. Coronary artery calcifications.    12/31/2018 Imaging    CT abdomen/pelvis: IMPRESSION: 1. 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 Abnormal soft  tissue associated with both kidneys and surrounding the proximal left ureter is also likely due to lymphoma. This soft tissue associated with the kidneys and left ureter results in mild bilateral hydronephrosis, left greater than right. 2. Small pleural effusions, mild pulmonary edema, ascites, and fluid and stranding in the mesentery is all likely due to third spacing of fluid. The mesenteric findings surrounds the pancreas but is probably part of the broader process of fluid third-spacing. Recommend clinical correlation to exclude signs of pancreatitis. If there is any concern for pancreatitis, recommend obtaining a lipase. 3. Small pulmonary nodules as above. 4. No pulmonary emboli. 5. Coronary artery calcifications.    01/01/2019 Initial Diagnosis    Diffuse large B-cell lymphoma ([place])    01/12/2019 Procedure    Left supraclavicular LN biopsy by Dr. Dalbert Batman    01/12/2019 Pathology Results    Accession: [id number]  Tissue-Flow Cytometry - MONOCLONAL B-CELL POPULATION WITH EXPRESSION OF CD10 COMPRISES 80% OF ALL LYMPHOCYTES - SEE COMMENT    01/12/2019 Pathology Results    Accession: [id number]  Lymph node for lymphoma, Left supraclavicular - DIFFUSE LARGE B-CELL LYMPHOMA - SEE COMMENT Microscopic Comment The excisional biopsy material is composed diffuse lymphoid proliferation. The lymphocytes are medium to large with vesicular chromatin, inconspicuous to prominent nucleoli, scant cytoplasm and admixed small benign lymphocytes. The atypical cells are B-cells by CD20 immunohistochemistry with expression of CD10, bcl-6, and bcl-2. The proliferative rate is high by ki-67 (70-80%). The neoplastic cells do not express CD5 or EBV by  in-situ hybridization. CD3 highlights background T-cells. Flow cytometry performed on the sample identified a kappa-restricted B-cell population with expression of CD10 that comprised 80% of all lymphocytes (See [id number]).    01/15/2019 Imaging     PET: IMPRESSION: 1. Extensive hypermetabolic adenopathy in the cervical lymph nodes, paratracheal nodes, periaortic nodes and iliac nodes. 2. Hypermetabolic nodal metastasis within the mesentery of the small bowel colon. 3. Extensive skeletal metastasis within the axillary and appendicular skeleton with intense metabolic activity. Expansile soft tissue lesions in the superior aspect of the LEFT scapula and RIGHT sacrum. These expansile soft tissue bone metastasis may be best targets for biopsy. 4. Normal spleen.     02/01/2019 -  Chemotherapy    The patient had DOXOrubicin (ADRIAMYCIN) chemo injection 100 mg, 49.5 mg/m2 = 102 mg, Intravenous,  Once, 2 of 6 cycles Administration: 100 mg (02/01/2019), 100 mg (02/22/2019) palonosetron (ALOXI) injection 0.25 mg, 0.25 mg, Intravenous,  Once, 2 of 6 cycles Administration: 0.25 mg (02/01/2019), 0.25 mg (02/22/2019) pegfilgrastim-cbqv (UDENYCA) injection 6 mg, 6 mg, Subcutaneous, Once, 2 of 6 cycles Administration: 6 mg (02/02/2019), 6 mg (02/23/2019) vinCRIStine (ONCOVIN) 2 mg in sodium chloride 0.9 % 50 mL chemo infusion, 2 mg, Intravenous,  Once, 2 of 6 cycles Administration: 2 mg (02/01/2019), 2 mg (02/22/2019) methotrexate (PF) 12 mg in sodium chloride (PF) 0.9 % INTRATHECAL chemo injection, , Intrathecal,  Once, 1 of 1 cycle Administration:  (02/05/2019) riTUXimab (RITUXAN) 800 mg in sodium chloride 0.9 % 250 mL (2.4242 mg/mL) infusion, 375 mg/m2 = 800 mg, Intravenous,  Once, 1 of 1 cycle Administration: 800 mg (02/01/2019) cyclophosphamide (CYTOXAN) 1,540 mg in sodium chloride 0.9 % 250 mL chemo infusion, 750 mg/m2 = 1,540 mg, Intravenous,  Once, 2 of 6 cycles Administration: 1,540 mg (02/01/2019), 1,540 mg (02/22/2019) riTUXimab (RITUXAN) 800 mg in sodium chloride 0.9 % 170 mL infusion, 375 mg/m2 = 800 mg (100 % of original dose 375 mg/m2), Intravenous,  Once, 1 of 5 cycles Dose modification: 375 mg/m2 (original dose 375 mg/m2, Cycle 2, Reason:  Provider Judgment, Comment: Changing to rapid infusion, tolerated 1st dose well) Administration: 800 mg (02/22/2019)  for chemotherapy treatment.      REVIEW OF SYSTEMS:   Constitutional: ( - ) fevers, ( - )  chills , ( + ) night sweats Eyes: ( - ) blurriness of vision, ( - ) double vision, ( - ) watery eyes Ears, nose, mouth, throat, and face: ( - ) mucositis, ( - ) sore throat Respiratory: ( - ) cough, ( - ) dyspnea, ( - ) wheezes Cardiovascular: ( - ) palpitation, ( - ) chest discomfort, ( - ) lower extremity swelling Gastrointestinal:  ( - ) nausea, ( - ) heartburn, ( - ) change in bowel habits Skin: ( - ) abnormal skin rashes Lymphatics: ( - ) new lymphadenopathy, ( - ) easy bruising Neurological: ( - ) numbness, ( - ) tingling, ( - ) new weaknesses Behavioral/Psych: ( - ) mood change, ( - ) new changes  All other systems were reviewed with the patient and are negative.  I have reviewed the past medical history, past surgical history, social history and family history with the patient and they are unchanged from previous note.  ALLERGIES:  is allergic to penicillins.  MEDICATIONS:  Current Outpatient Medications  Medication Sig Dispense Refill  . acetaminophen (TYLENOL) 325 MG tablet Take 975 mg by mouth every 6 (six) hours as needed for mild pain.    Marland Kitchen acyclovir (ZOVIRAX) 400  MG tablet Take 1 tablet (400 mg total) by mouth 2 (two) times daily. 180 tablet 3  . allopurinol (ZYLOPRIM) 100 MG tablet Take 1 tablet (100 mg total) by mouth daily for 30 days. 30 tablet 5  . diclofenac sodium (VOLTAREN) 1 % GEL Apply 2 g topically 4 (four) times daily for 30 days. 1 Tube 5  . docusate sodium (COLACE) 100 MG capsule Take 100 mg by mouth 2 (two) times daily.    . ferrous sulfate 325 (65 FE) MG EC tablet Take 325 mg by mouth daily.     . fluconazole (DIFLUCAN) 100 MG tablet Take 1 tablet (100 mg total) by mouth daily for 30 days. Take 2 tabs on Day 1, and then 1 tab daily x 14 days 30 tablet  3  . gabapentin (NEURONTIN) 300 MG capsule Take 1 capsule (300 mg total) by mouth 2 (two) times daily for 30 days. 60 capsule 3  . lidocaine-prilocaine (EMLA) cream Apply to affected area once 30 g 3  . LORazepam (ATIVAN) 0.5 MG tablet Take 1 tablet (0.5 mg total) by mouth every 6 (six) hours as needed (Nausea or vomiting). 30 tablet 0  . magic mouthwash w/lidocaine SOLN Take 5 mLs by mouth 4 (four) times daily as needed for mouth pain. 200 mL 5  . morphine (MSIR) 15 MG tablet Take 1 tablet (15 mg total) by mouth every 4 (four) hours as needed for up to 30 days for severe pain. 90 tablet 0  . ondansetron (ZOFRAN) 8 MG tablet Take 1 tablet (8 mg total) by mouth 2 (two) times daily as needed for refractory nausea / vomiting. Start on day 3 after cyclophosphamide chemotherapy. 30 tablet 1  . predniSONE (DELTASONE) 20 MG tablet Take 5 tablets (100 mg total) by mouth daily at 2 PM for 5 days. 25 tablet 4  . prochlorperazine (COMPAZINE) 10 MG tablet Take 1 tablet (10 mg total) by mouth every 6 (six) hours as needed (Nausea or vomiting). 30 tablet 6  . traMADol (ULTRAM) 50 MG tablet Take 1 tablet (50 mg total) by mouth every 8 (eight) hours as needed. 90 tablet 0   No current facility-administered medications for this visit.     PHYSICAL EXAMINATION: ECOG PERFORMANCE STATUS: 1 - Symptomatic but completely ambulatory  Today's Vitals   03/22/19 0924  BP: (!) 161/90  Pulse: 85  Resp: 18  Temp: 97.7 F (36.5 C)  SpO2: 100%  Weight: 196 lb (88.9 kg)  PainSc: 3    Body mass index is 28.94 kg/m.  Filed Weights   03/22/19 0924  Weight: 196 lb (88.9 kg)    GENERAL: alert, no distress and comfortable SKIN: skin color, texture, turgor are normal, no rashes or significant lesions EYES: conjunctiva are pink and non-injected, sclera clear OROPHARYNX: no exudate, no erythema; lips, buccal mucosa, and tongue normal  NECK: supple, non-tender LYMPH:  no palpable lymphadenopathy in the cervical   LUNGS: clear to auscultation with normal breathing effort HEART: regular rate & rhythm and no murmurs and 1+ right ankle edema ABDOMEN: soft, non-tender, non-distended, normal bowel sounds Musculoskeletal: no cyanosis of digits and no clubbing  PSYCH: alert & oriented x 3, fluent speech NEURO: no focal motor/sensory deficits  LABORATORY DATA:  I have reviewed the data as listed    Component Value Date/Time   NA 138 03/20/2019 1255   K 4.8 03/20/2019 1255   CL 102 03/20/2019 1255   CO2 24 03/20/2019 1255   GLUCOSE 261 (H) 03/20/2019  1255   BUN 32 (H) 03/20/2019 1255   CREATININE 1.48 (H) 03/20/2019 1255   CALCIUM 8.8 (L) 03/20/2019 1255   PROT 6.8 03/20/2019 1255   ALBUMIN 4.6 03/20/2019 1255   AST 11 (L) 03/20/2019 1255   ALT 18 03/20/2019 1255   ALKPHOS 64 03/20/2019 1255   BILITOT 0.3 03/20/2019 1255   GFRNONAA 54 (L) 03/20/2019 1255   GFRAA >60 03/20/2019 1255    No results found for: SPEP, UPEP  Lab Results  Component Value Date   WBC 7.2 03/22/2019   NEUTROABS 6.5 03/22/2019   HGB 9.6 (L) 03/22/2019   HCT 30.3 (L) 03/22/2019   MCV 86.6 03/22/2019   PLT 209 03/22/2019      Chemistry      Component Value Date/Time   NA 138 03/20/2019 1255   K 4.8 03/20/2019 1255   CL 102 03/20/2019 1255   CO2 24 03/20/2019 1255   BUN 32 (H) 03/20/2019 1255   CREATININE 1.48 (H) 03/20/2019 1255      Component Value Date/Time   CALCIUM 8.8 (L) 03/20/2019 1255   ALKPHOS 64 03/20/2019 1255   AST 11 (L) 03/20/2019 1255   ALT 18 03/20/2019 1255   BILITOT 0.3 03/20/2019 1255

## 2019-03-22 NOTE — Telephone Encounter (Signed)
No change in appts per 4/9 los

## 2019-03-22 NOTE — Patient Instructions (Signed)

## 2019-03-22 NOTE — Patient Instructions (Signed)
Cyclophosphamide injection What is this medicine? CYCLOPHOSPHAMIDE (sye kloe FOSS fa mide) is a chemotherapy drug. It slows the growth of cancer cells. This medicine is used to treat many types of cancer like lymphoma, myeloma, leukemia, breast cancer, and ovarian cancer, to name a few. This medicine may be used for other purposes; ask your health care provider or pharmacist if you have questions. COMMON BRAND NAME(S): Cytoxan, Neosar What should I tell my health care provider before I take this medicine? They need to know if you have any of these conditions: -blood disorders -history of other chemotherapy -infection -kidney disease -liver disease -recent or ongoing radiation therapy -tumors in the bone marrow -an unusual or allergic reaction to cyclophosphamide, other chemotherapy, other medicines, foods, dyes, or preservatives -pregnant or trying to get pregnant -breast-feeding How should I use this medicine? This drug is usually given as an injection into a vein or muscle or by infusion into a vein. It is administered in a hospital or clinic by a specially trained health care professional. Talk to your pediatrician regarding the use of this medicine in children. Special care may be needed. Overdosage: If you think you have taken too much of this medicine contact a poison control center or emergency room at once. NOTE: This medicine is only for you. Do not share this medicine with others. What if I miss a dose? It is important not to miss your dose. Call your doctor or health care professional if you are unable to keep an appointment. What may interact with this medicine? This medicine may interact with the following medications: -amiodarone -amphotericin B -azathioprine -certain antiviral medicines for HIV or AIDS such as protease inhibitors (e.g., indinavir, ritonavir) and zidovudine -certain blood pressure medications such as benazepril, captopril, enalapril, fosinopril,  lisinopril, moexipril, monopril, perindopril, quinapril, ramipril, trandolapril -certain cancer medications such as anthracyclines (e.g., daunorubicin, doxorubicin), busulfan, cytarabine, paclitaxel, pentostatin, tamoxifen, trastuzumab -certain diuretics such as chlorothiazide, chlorthalidone, hydrochlorothiazide, indapamide, metolazone -certain medicines that treat or prevent blood clots like warfarin -certain muscle relaxants such as succinylcholine -cyclosporine -etanercept -indomethacin -medicines to increase blood counts like filgrastim, pegfilgrastim, sargramostim -medicines used as general anesthesia -metronidazole -natalizumab This list may not describe all possible interactions. Give your health care provider a list of all the medicines, herbs, non-prescription drugs, or dietary supplements you use. Also tell them if you smoke, drink alcohol, or use illegal drugs. Some items may interact with your medicine. What should I watch for while using this medicine? Visit your doctor for checks on your progress. This drug may make you feel generally unwell. This is not uncommon, as chemotherapy can affect healthy cells as well as cancer cells. Report any side effects. Continue your course of treatment even though you feel ill unless your doctor tells you to stop. Drink water or other fluids as directed. Urinate often, even at night. In some cases, you may be given additional medicines to help with side effects. Follow all directions for their use. Call your doctor or health care professional for advice if you get a fever, chills or sore throat, or other symptoms of a cold or flu. Do not treat yourself. This drug decreases your body's ability to fight infections. Try to avoid being around people who are sick. This medicine may increase your risk to bruise or bleed. Call your doctor or health care professional if you notice any unusual bleeding. Be careful brushing and flossing your teeth or using a  toothpick because you may get an infection or bleed   more easily. If you have any dental work done, tell your dentist you are receiving this medicine. You may get drowsy or dizzy. Do not drive, use machinery, or do anything that needs mental alertness until you know how this medicine affects you. Do not become pregnant while taking this medicine or for 1 year after stopping it. Women should inform their doctor if they wish to become pregnant or think they might be pregnant. Men should not father a child while taking this medicine and for 4 months after stopping it. There is a potential for serious side effects to an unborn child. Talk to your health care professional or pharmacist for more information. Do not breast-feed an infant while taking this medicine. This medicine may interfere with the ability to have a child. This medicine has caused ovarian failure in some women. This medicine has caused reduced sperm counts in some men. You should talk with your doctor or health care professional if you are concerned about your fertility. If you are going to have surgery, tell your doctor or health care professional that you have taken this medicine. What side effects may I notice from receiving this medicine? Side effects that you should report to your doctor or health care professional as soon as possible: -allergic reactions like skin rash, itching or hives, swelling of the face, lips, or tongue -low blood counts - this medicine may decrease the number of white blood cells, red blood cells and platelets. You may be at increased risk for infections and bleeding. -signs of infection - fever or chills, cough, sore throat, pain or difficulty passing urine -signs of decreased platelets or bleeding - bruising, pinpoint red spots on the skin, black, tarry stools, blood in the urine -signs of decreased red blood cells - unusually weak or tired, fainting spells, lightheadedness -breathing problems -dark  urine -dizziness -palpitations -swelling of the ankles, feet, hands -trouble passing urine or change in the amount of urine -weight gain -yellowing of the eyes or skin Side effects that usually do not require medical attention (report to your doctor or health care professional if they continue or are bothersome): -changes in nail or skin color -hair loss -missed menstrual periods -mouth sores -nausea, vomiting This list may not describe all possible side effects. Call your doctor for medical advice about side effects. You may report side effects to FDA at 1-800-FDA-1088. Where should I keep my medicine? This drug is given in a hospital or clinic and will not be stored at home. NOTE: This sheet is a summary. It may not cover all possible information. If you have questions about this medicine, talk to your doctor, pharmacist, or health care provider.  2019 Elsevier/Gold Standard (2012-10-13 16:22:58) Vincristine injection What is this medicine? VINCRISTINE (vin KRIS teen) is a chemotherapy drug. It slows the growth of cancer cells. This medicine is used to treat many types of cancer like Hodgkin's disease, leukemia, non-Hodgkin's lymphoma, neuroblastoma (brain cancer), rhabdomyosarcoma, and Wilms' tumor. This medicine may be used for other purposes; ask your health care provider or pharmacist if you have questions. COMMON BRAND NAME(S): Oncovin, Vincasar PFS What should I tell my health care provider before I take this medicine? They need to know if you have any of these conditions: -blood disorders -gout -infection (especially chickenpox, cold sores, or herpes) -kidney disease -liver disease -lung disease -nervous system disease like Charcot-Marie-Tooth (CMT) -recent or ongoing radiation therapy -an unusual or allergic reaction to vincristine, other chemotherapy agents, other medicines, foods, dyes, or  preservatives -pregnant or trying to get pregnant -breast-feeding How should I  use this medicine? This drug is given as an infusion into a vein. It is administered in a hospital or clinic by a specially trained health care professional. If you have pain, swelling, burning, or any unusual feeling around the site of your injection, tell your health care professional right away. Talk to your pediatrician regarding the use of this medicine in children. While this drug may be prescribed for selected conditions, precautions do apply. Overdosage: If you think you have taken too much of this medicine contact a poison control center or emergency room at once. NOTE: This medicine is only for you. Do not share this medicine with others. What if I miss a dose? It is important not to miss your dose. Call your doctor or health care professional if you are unable to keep an appointment. What may interact with this medicine? Do not take this medicine with any of the following medications: -itraconazole -mibefradil -voriconazole This medicine may also interact with the following medications: -cyclosporine -erythromycin -fluconazole -ketoconazole -medicines for HIV like delavirdine, efavirenz, nevirapine -medicines for seizures like ethotoin, fosphenotoin, phenytoin -medicines to increase blood counts like filgrastim, pegfilgrastim, sargramostim -other chemotherapy drugs like cisplatin, L-asparaginase, methotrexate, mitomycin, paclitaxel -pegaspargase -vaccines -zalcitabine, ddC Talk to your doctor or health care professional before taking any of these medicines: -acetaminophen -aspirin -ibuprofen -ketoprofen -naproxen This list may not describe all possible interactions. Give your health care provider a list of all the medicines, herbs, non-prescription drugs, or dietary supplements you use. Also tell them if you smoke, drink alcohol, or use illegal drugs. Some items may interact with your medicine. What should I watch for while using this medicine? Your condition will be  monitored carefully while you are receiving this medicine. You will need important blood work done while you are taking this medicine. This drug may make you feel generally unwell. This is not uncommon, as chemotherapy can affect healthy cells as well as cancer cells. Report any side effects. Continue your course of treatment even though you feel ill unless your doctor tells you to stop. In some cases, you may be given additional medicines to help with side effects. Follow all directions for their use. Call your doctor or health care professional for advice if you get a fever, chills or sore throat, or other symptoms of a cold or flu. Do not treat yourself. Avoid taking products that contain aspirin, acetaminophen, ibuprofen, naproxen, or ketoprofen unless instructed by your doctor. These medicines may hide a fever. Do not become pregnant while taking this medicine. Women should inform their doctor if they wish to become pregnant or think they might be pregnant. There is a potential for serious side effects to an unborn child. Talk to your health care professional or pharmacist for more information. Do not breast-feed an infant while taking this medicine. Men may have a lower sperm count while taking this medicine. Talk to your doctor if you plan to father a child. What side effects may I notice from receiving this medicine? Side effects that you should report to your doctor or health care professional as soon as possible: -allergic reactions like skin rash, itching or hives, swelling of the face, lips, or tongue -breathing problems -confusion or changes in emotions or moods -constipation -cough -mouth sores -muscle weakness -nausea and vomiting -pain, swelling, redness or irritation at the injection site -pain, tingling, numbness in the hands or feet -problems with balance, talking, walking -seizures -stomach   pain -trouble passing urine or change in the amount of urine Side effects that  usually do not require medical attention (report to your doctor or health care professional if they continue or are bothersome): -diarrhea -hair loss -jaw pain -loss of appetite This list may not describe all possible side effects. Call your doctor for medical advice about side effects. You may report side effects to FDA at 1-800-FDA-1088. Where should I keep my medicine? This drug is given in a hospital or clinic and will not be stored at home. NOTE: This sheet is a summary. It may not cover all possible information. If you have questions about this medicine, talk to your doctor, pharmacist, or health care provider.  2019 Elsevier/Gold Standard (2008-08-26 17:17:13) Doxorubicin injection What is this medicine? DOXORUBICIN (dox oh ROO bi sin) is a chemotherapy drug. It is used to treat many kinds of cancer like leukemia, lymphoma, neuroblastoma, sarcoma, and Wilms' tumor. It is also used to treat bladder cancer, breast cancer, lung cancer, ovarian cancer, stomach cancer, and thyroid cancer. This medicine may be used for other purposes; ask your health care provider or pharmacist if you have questions. COMMON BRAND NAME(S): Adriamycin, Adriamycin PFS, Adriamycin RDF, Rubex What should I tell my health care provider before I take this medicine? They need to know if you have any of these conditions: -heart disease -history of low blood counts caused by a medicine -liver disease -recent or ongoing radiation therapy -an unusual or allergic reaction to doxorubicin, other chemotherapy agents, other medicines, foods, dyes, or preservatives -pregnant or trying to get pregnant -breast-feeding How should I use this medicine? This drug is given as an infusion into a vein. It is administered in a hospital or clinic by a specially trained health care professional. If you have pain, swelling, burning or any unusual feeling around the site of your injection, tell your health care professional right  away. Talk to your pediatrician regarding the use of this medicine in children. Special care may be needed. Overdosage: If you think you have taken too much of this medicine contact a poison control center or emergency room at once. NOTE: This medicine is only for you. Do not share this medicine with others. What if I miss a dose? It is important not to miss your dose. Call your doctor or health care professional if you are unable to keep an appointment. What may interact with this medicine? This medicine may interact with the following medications: -6-mercaptopurine -paclitaxel -phenytoin -St. John's Wort -trastuzumab -verapamil This list may not describe all possible interactions. Give your health care provider a list of all the medicines, herbs, non-prescription drugs, or dietary supplements you use. Also tell them if you smoke, drink alcohol, or use illegal drugs. Some items may interact with your medicine. What should I watch for while using this medicine? This drug may make you feel generally unwell. This is not uncommon, as chemotherapy can affect healthy cells as well as cancer cells. Report any side effects. Continue your course of treatment even though you feel ill unless your doctor tells you to stop. There is a maximum amount of this medicine you should receive throughout your life. The amount depends on the medical condition being treated and your overall health. Your doctor will watch how much of this medicine you receive in your lifetime. Tell your doctor if you have taken this medicine before. You may need blood work done while you are taking this medicine. Your urine may turn red for a  few days after your dose. This is not blood. If your urine is dark or brown, call your doctor. In some cases, you may be given additional medicines to help with side effects. Follow all directions for their use. Call your doctor or health care professional for advice if you get a fever, chills or  sore throat, or other symptoms of a cold or flu. Do not treat yourself. This drug decreases your body's ability to fight infections. Try to avoid being around people who are sick. This medicine may increase your risk to bruise or bleed. Call your doctor or health care professional if you notice any unusual bleeding. Talk to your doctor about your risk of cancer. You may be more at risk for certain types of cancers if you take this medicine. Do not become pregnant while taking this medicine or for 6 months after stopping it. Women should inform their doctor if they wish to become pregnant or think they might be pregnant. Men should not father a child while taking this medicine and for 6 months after stopping it. There is a potential for serious side effects to an unborn child. Talk to your health care professional or pharmacist for more information. Do not breast-feed an infant while taking this medicine. This medicine has caused ovarian failure in some women and reduced sperm counts in some men This medicine may interfere with the ability to have a child. Talk with your doctor or health care professional if you are concerned about your fertility. This medicine may cause a decrease in Co-Enzyme Q-10. You should make sure that you get enough Co-Enzyme Q-10 while you are taking this medicine. Discuss the foods you eat and the vitamins you take with your health care professional. What side effects may I notice from receiving this medicine? Side effects that you should report to your doctor or health care professional as soon as possible: -allergic reactions like skin rash, itching or hives, swelling of the face, lips, or tongue -breathing problems -chest pain -fast or irregular heartbeat -low blood counts - this medicine may decrease the number of white blood cells, red blood cells and platelets. You may be at increased risk for infections and bleeding. -pain, redness, or irritation at site where  injected -signs of infection - fever or chills, cough, sore throat, pain or difficulty passing urine -signs of decreased platelets or bleeding - bruising, pinpoint red spots on the skin, black, tarry stools, blood in the urine -swelling of the ankles, feet, hands -tiredness -weakness Side effects that usually do not require medical attention (report to your doctor or health care professional if they continue or are bothersome): -diarrhea -hair loss -mouth sores -nail discoloration or damage -nausea -red colored urine -vomiting This list may not describe all possible side effects. Call your doctor for medical advice about side effects. You may report side effects to FDA at 1-800-FDA-1088. Where should I keep my medicine? This drug is given in a hospital or clinic and will not be stored at home. NOTE: This sheet is a summary. It may not cover all possible information. If you have questions about this medicine, talk to your doctor, pharmacist, or health care provider.  2019 Elsevier/Gold Standard (2017-07-13 11:01:26) Rituximab injection What is this medicine? RITUXIMAB (ri TUX i mab) is a monoclonal antibody. It is used to treat certain types of cancer like non-Hodgkin lymphoma and chronic lymphocytic leukemia. It is also used to treat rheumatoid arthritis, granulomatosis with polyangiitis (or Wegener's granulomatosis), microscopic polyangiitis, and  pemphigus vulgaris. This medicine may be used for other purposes; ask your health care provider or pharmacist if you have questions. COMMON BRAND NAME(S): Rituxan What should I tell my health care provider before I take this medicine? They need to know if you have any of these conditions: -heart disease -infection (especially a virus infection such as hepatitis B, chickenpox, cold sores, or herpes) -immune system problems -irregular heartbeat -kidney disease -low blood counts, like low white cell, platelet, or red cell counts -lung or  breathing disease, like asthma -recently received or scheduled to receive a vaccine -an unusual or allergic reaction to rituximab, other medicines, foods, dyes, or preservatives -pregnant or trying to get pregnant -breast-feeding How should I use this medicine? This medicine is for infusion into a vein. It is administered in a hospital or clinic by a specially trained health care professional. A special MedGuide will be given to you by the pharmacist with each prescription and refill. Be sure to read this information carefully each time. Talk to your pediatrician regarding the use of this medicine in children. This medicine is not approved for use in children. Overdosage: If you think you have taken too much of this medicine contact a poison control center or emergency room at once. NOTE: This medicine is only for you. Do not share this medicine with others. What if I miss a dose? It is important not to miss a dose. Call your doctor or health care professional if you are unable to keep an appointment. What may interact with this medicine? -cisplatin -live virus vaccines This list may not describe all possible interactions. Give your health care provider a list of all the medicines, herbs, non-prescription drugs, or dietary supplements you use. Also tell them if you smoke, drink alcohol, or use illegal drugs. Some items may interact with your medicine. What should I watch for while using this medicine? Your condition will be monitored carefully while you are receiving this medicine. You may need blood work done while you are taking this medicine. This medicine can cause serious allergic reactions. To reduce your risk you may need to take medicine before treatment with this medicine. Take your medicine as directed. In some patients, this medicine may cause a serious brain infection that may cause death. If you have any problems seeing, thinking, speaking, walking, or standing, tell your healthcare  professional right away. If you cannot reach your healthcare professional, urgently seek other source of medical care. Call your doctor or health care professional for advice if you get a fever, chills or sore throat, or other symptoms of a cold or flu. Do not treat yourself. This drug decreases your body's ability to fight infections. Try to avoid being around people who are sick. Do not become pregnant while taking this medicine or for 12 months after stopping it. Women should inform their doctor if they wish to become pregnant or think they might be pregnant. There is a potential for serious side effects to an unborn child. Talk to your health care professional or pharmacist for more information. Do not breast-feed an infant while taking this medicine or for 6 months after stopping it. What side effects may I notice from receiving this medicine? Side effects that you should report to your doctor or health care professional as soon as possible: -allergic reactions like skin rash, itching or hives; swelling of the face, lips, or tongue -breathing problems -chest pain -changes in vision -diarrhea -headache with fever, neck stiffness, sensitivity to light, nausea,  or confusion -fast, irregular heartbeat -loss of memory -low blood counts - this medicine may decrease the number of white blood cells, red blood cells and platelets. You may be at increased risk for infections and bleeding. -mouth sores -problems with balance, talking, or walking -redness, blistering, peeling or loosening of the skin, including inside the mouth -signs of infection - fever or chills, cough, sore throat, pain or difficulty passing urine -signs and symptoms of kidney injury like trouble passing urine or change in the amount of urine -signs and symptoms of liver injury like dark yellow or brown urine; general ill feeling or flu-like symptoms; light-colored stools; loss of appetite; nausea; right upper belly pain; unusually  weak or tired; yellowing of the eyes or skin -signs and symptoms of low blood pressure like dizziness; feeling faint or lightheaded, falls; unusually weak or tired -stomach pain -swelling of the ankles, feet, hands -unusual bleeding or bruising -vomiting Side effects that usually do not require medical attention (report to your doctor or health care professional if they continue or are bothersome): -headache -joint pain -muscle cramps or muscle pain -nausea -tiredness This list may not describe all possible side effects. Call your doctor for medical advice about side effects. You may report side effects to FDA at 1-800-FDA-1088. Where should I keep my medicine? This drug is given in a hospital or clinic and will not be stored at home. NOTE: This sheet is a summary. It may not cover all possible information. If you have questions about this medicine, talk to your doctor, pharmacist, or health care provider.  2019 [employer] (2017-11-11 13:04:32)

## 2019-03-23 ENCOUNTER — Inpatient Hospital Stay: Payer: BC Managed Care – PPO

## 2019-03-23 VITALS — BP 162/83 | Temp 98.1°F | Resp 17

## 2019-03-23 DIAGNOSIS — Z5112 Encounter for antineoplastic immunotherapy: Secondary | ICD-10-CM | POA: Diagnosis not present

## 2019-03-23 DIAGNOSIS — C8338 Diffuse large B-cell lymphoma, lymph nodes of multiple sites: Secondary | ICD-10-CM

## 2019-03-23 MED ORDER — PEGFILGRASTIM-CBQV 6 MG/0.6ML ~~LOC~~ SOSY
PREFILLED_SYRINGE | SUBCUTANEOUS | Status: AC
  Filled 2019-03-23: qty 0.6

## 2019-03-23 MED ORDER — PEGFILGRASTIM-CBQV 6 MG/0.6ML ~~LOC~~ SOSY
6.0000 mg | PREFILLED_SYRINGE | Freq: Once | SUBCUTANEOUS | Status: AC
  Administered 2019-03-23: 6 mg via SUBCUTANEOUS

## 2019-03-23 NOTE — Patient Instructions (Signed)
Pegfilgrastim injection  What is this medicine?  PEGFILGRASTIM (PEG fil gra stim) is a long-acting granulocyte colony-stimulating factor that stimulates the growth of neutrophils, a type of white blood cell important in the body's fight against infection. It is used to reduce the incidence of fever and infection in patients with certain types of cancer who are receiving chemotherapy that affects the bone marrow, and to increase survival after being exposed to high doses of radiation.  This medicine may be used for other purposes; ask your health care provider or pharmacist if you have questions.  COMMON BRAND NAME(S): Fulphila, Neulasta, UDENYCA  What should I tell my health care provider before I take this medicine?  They need to know if you have any of these conditions:  -kidney disease  -latex allergy  -ongoing radiation therapy  -sickle cell disease  -skin reactions to acrylic adhesives (On-Body Injector only)  -an unusual or allergic reaction to pegfilgrastim, filgrastim, other medicines, foods, dyes, or preservatives  -pregnant or trying to get pregnant  -breast-feeding  How should I use this medicine?  This medicine is for injection under the skin. If you get this medicine at home, you will be taught how to prepare and give the pre-filled syringe or how to use the On-body Injector. Refer to the patient Instructions for Use for detailed instructions. Use exactly as directed. Tell your healthcare provider immediately if you suspect that the On-body Injector may not have performed as intended or if you suspect the use of the On-body Injector resulted in a missed or partial dose.  It is important that you put your used needles and syringes in a special sharps container. Do not put them in a trash can. If you do not have a sharps container, call your pharmacist or healthcare provider to get one.  Talk to your pediatrician regarding the use of this medicine in children. While this drug may be prescribed for  selected conditions, precautions do apply.  Overdosage: If you think you have taken too much of this medicine contact a poison control center or emergency room at once.  NOTE: This medicine is only for you. Do not share this medicine with others.  What if I miss a dose?  It is important not to miss your dose. Call your doctor or health care professional if you miss your dose. If you miss a dose due to an On-body Injector failure or leakage, a new dose should be administered as soon as possible using a single prefilled syringe for manual use.  What may interact with this medicine?  Interactions have not been studied.  Give your health care provider a list of all the medicines, herbs, non-prescription drugs, or dietary supplements you use. Also tell them if you smoke, drink alcohol, or use illegal drugs. Some items may interact with your medicine.  This list may not describe all possible interactions. Give your health care provider a list of all the medicines, herbs, non-prescription drugs, or dietary supplements you use. Also tell them if you smoke, drink alcohol, or use illegal drugs. Some items may interact with your medicine.  What should I watch for while using this medicine?  You may need blood work done while you are taking this medicine.  If you are going to need a MRI, CT scan, or other procedure, tell your doctor that you are using this medicine (On-Body Injector only).  What side effects may I notice from receiving this medicine?  Side effects that you should report to   your doctor or health care professional as soon as possible:  -allergic reactions like skin rash, itching or hives, swelling of the face, lips, or tongue  -back pain  -dizziness  -fever  -pain, redness, or irritation at site where injected  -pinpoint red spots on the skin  -red or dark-brown urine  -shortness of breath or breathing problems  -stomach or side pain, or pain at the shoulder  -swelling  -tiredness  -trouble passing urine or  change in the amount of urine  Side effects that usually do not require medical attention (report to your doctor or health care professional if they continue or are bothersome):  -bone pain  -muscle pain  This list may not describe all possible side effects. Call your doctor for medical advice about side effects. You may report side effects to FDA at 1-800-FDA-1088.  Where should I keep my medicine?  Keep out of the reach of children.  If you are using this medicine at home, you will be instructed on how to store it. Throw away any unused medicine after the expiration date on the label.  NOTE: This sheet is a summary. It may not cover all possible information. If you have questions about this medicine, talk to your doctor, pharmacist, or health care provider.   2019 Elsevier/Gold Standard (2018-03-06 16:57:08)

## 2019-03-29 ENCOUNTER — Other Ambulatory Visit: Payer: Self-pay

## 2019-03-29 ENCOUNTER — Encounter (HOSPITAL_COMMUNITY)
Admission: RE | Admit: 2019-03-29 | Discharge: 2019-03-29 | Disposition: A | Discharge status: Home / Self Care | Payer: BC Managed Care – PPO | Source: Ambulatory Visit | Attending: Hematology | Admitting: Hematology

## 2019-03-29 DIAGNOSIS — C8338 Diffuse large B-cell lymphoma, lymph nodes of multiple sites: Secondary | ICD-10-CM | POA: Diagnosis not present

## 2019-03-29 LAB — GLUCOSE, CAPILLARY: Glucose-Capillary: 121 mg/dL — ABNORMAL HIGH (ref 70–99)

## 2019-03-29 MED ORDER — FLUDEOXYGLUCOSE F - 18 (FDG) INJECTION
9.8400 | Freq: Once | INTRAVENOUS | Status: AC | PRN
  Administered 2019-03-29: 9.84 via INTRAVENOUS

## 2019-04-03 ENCOUNTER — Encounter (HOSPITAL_COMMUNITY): Payer: BC Managed Care – PPO

## 2019-04-05 ENCOUNTER — Other Ambulatory Visit: Payer: BC Managed Care – PPO

## 2019-04-05 ENCOUNTER — Ambulatory Visit: Payer: BC Managed Care – PPO

## 2019-04-05 ENCOUNTER — Ambulatory Visit: Payer: BC Managed Care – PPO | Admitting: Hematology

## 2019-04-06 ENCOUNTER — Ambulatory Visit: Payer: BC Managed Care – PPO

## 2019-04-06 ENCOUNTER — Telehealth: Payer: Self-pay | Admitting: *Deleted

## 2019-04-06 NOTE — Telephone Encounter (Signed)
Returned patient's phone call regarding his eyes. Patient stated,"I think I have pink eye. I used a warm washcloth on my left eye, and then used the same washcloth on the right eye. I didn't think about spreading infection from one eye to the other. I have an appointment at [facility] at 12:15pm today."

## 2019-04-12 ENCOUNTER — Inpatient Hospital Stay (HOSPITAL_BASED_OUTPATIENT_CLINIC_OR_DEPARTMENT_OTHER): Payer: BC Managed Care – PPO | Admitting: Hematology

## 2019-04-12 ENCOUNTER — Inpatient Hospital Stay: Payer: BC Managed Care – PPO

## 2019-04-12 ENCOUNTER — Encounter: Payer: Self-pay | Admitting: Hematology

## 2019-04-12 ENCOUNTER — Other Ambulatory Visit: Payer: Self-pay

## 2019-04-12 DIAGNOSIS — B370 Candidal stomatitis: Secondary | ICD-10-CM

## 2019-04-12 DIAGNOSIS — M25471 Effusion, right ankle: Secondary | ICD-10-CM | POA: Diagnosis not present

## 2019-04-12 DIAGNOSIS — D6481 Anemia due to antineoplastic chemotherapy: Secondary | ICD-10-CM | POA: Diagnosis not present

## 2019-04-12 DIAGNOSIS — C8338 Diffuse large B-cell lymphoma, lymph nodes of multiple sites: Secondary | ICD-10-CM | POA: Diagnosis not present

## 2019-04-12 DIAGNOSIS — M79604 Pain in right leg: Secondary | ICD-10-CM

## 2019-04-12 DIAGNOSIS — T451X5A Adverse effect of antineoplastic and immunosuppressive drugs, initial encounter: Secondary | ICD-10-CM

## 2019-04-12 DIAGNOSIS — Z5112 Encounter for antineoplastic immunotherapy: Secondary | ICD-10-CM | POA: Diagnosis not present

## 2019-04-12 LAB — CBC WITH DIFFERENTIAL (CANCER CENTER ONLY)
Abs Immature Granulocytes: 0.04 10*3/uL (ref 0.00–0.07)
Basophils Absolute: 0 10*3/uL (ref 0.0–0.1)
Basophils Relative: 0 %
Eosinophils Absolute: 0 10*3/uL (ref 0.0–0.5)
Eosinophils Relative: 0 %
HCT: 31.9 % — ABNORMAL LOW (ref 39.0–52.0)
Hemoglobin: 10.6 g/dL — ABNORMAL LOW (ref 13.0–17.0)
Immature Granulocytes: 0 %
Lymphocytes Relative: 7 %
Lymphs Abs: 0.6 10*3/uL — ABNORMAL LOW (ref 0.7–4.0)
MCH: 29.2 pg (ref 26.0–34.0)
MCHC: 33.2 g/dL (ref 30.0–36.0)
MCV: 87.9 fL (ref 80.0–100.0)
Monocytes Absolute: 0.4 10*3/uL (ref 0.1–1.0)
Monocytes Relative: 4 %
Neutro Abs: 7.8 10*3/uL — ABNORMAL HIGH (ref 1.7–7.7)
Neutrophils Relative %: 89 %
Platelet Count: 228 10*3/uL (ref 150–400)
RBC: 3.63 MIL/uL — ABNORMAL LOW (ref 4.22–5.81)
RDW: 19.2 % — ABNORMAL HIGH (ref 11.5–15.5)
WBC Count: 8.9 10*3/uL (ref 4.0–10.5)
nRBC: 0 % (ref 0.0–0.2)

## 2019-04-12 LAB — CMP (CANCER CENTER ONLY)
ALT: 21 U/L (ref 0–44)
AST: 13 U/L — ABNORMAL LOW (ref 15–41)
Albumin: 4.4 g/dL (ref 3.5–5.0)
Alkaline Phosphatase: 73 U/L (ref 38–126)
Anion gap: 9 (ref 5–15)
BUN: 24 mg/dL — ABNORMAL HIGH (ref 6–20)
CO2: 29 mmol/L (ref 22–32)
Calcium: 9.5 mg/dL (ref 8.9–10.3)
Chloride: 102 mmol/L (ref 98–111)
Creatinine: 0.95 mg/dL (ref 0.61–1.24)
GFR, Est AFR Am: >60 mL/min (ref >60)
GFR, Estimated: >60 mL/min (ref >60)
Glucose, Bld: 136 mg/dL — ABNORMAL HIGH (ref 70–99)
Potassium: 4.1 mmol/L (ref 3.5–5.1)
Sodium: 140 mmol/L (ref 135–145)
Total Bilirubin: 0.3 mg/dL (ref 0.3–1.2)
Total Protein: 6.6 g/dL (ref 6.5–8.1)

## 2019-04-12 MED ORDER — DIPHENHYDRAMINE HCL 25 MG PO CAPS
ORAL_CAPSULE | ORAL | Status: AC
  Filled 2019-04-12: qty 1

## 2019-04-12 MED ORDER — SODIUM CHLORIDE 0.9 % IV SOLN
750.0000 mg/m2 | Freq: Once | INTRAVENOUS | Status: AC
  Administered 2019-04-12: 12:00:00 1540 mg via INTRAVENOUS
  Filled 2019-04-12: qty 77

## 2019-04-12 MED ORDER — ACETAMINOPHEN 325 MG PO TABS
650.0000 mg | ORAL_TABLET | Freq: Once | ORAL | Status: AC
  Administered 2019-04-12: 650 mg via ORAL

## 2019-04-12 MED ORDER — VINCRISTINE SULFATE CHEMO INJECTION 1 MG/ML
2.0000 mg | Freq: Once | INTRAVENOUS | Status: AC
  Administered 2019-04-12: 2 mg via INTRAVENOUS
  Filled 2019-04-12: qty 2

## 2019-04-12 MED ORDER — DEXAMETHASONE SODIUM PHOSPHATE 10 MG/ML IJ SOLN
10.0000 mg | Freq: Once | INTRAMUSCULAR | Status: AC
  Administered 2019-04-12: 10 mg via INTRAVENOUS

## 2019-04-12 MED ORDER — PALONOSETRON HCL INJECTION 0.25 MG/5ML
0.2500 mg | Freq: Once | INTRAVENOUS | Status: AC
  Administered 2019-04-12: 0.25 mg via INTRAVENOUS

## 2019-04-12 MED ORDER — ACETAMINOPHEN 325 MG PO TABS
ORAL_TABLET | ORAL | Status: AC
  Filled 2019-04-12: qty 2

## 2019-04-12 MED ORDER — PALONOSETRON HCL INJECTION 0.25 MG/5ML
INTRAVENOUS | Status: AC
  Filled 2019-04-12: qty 5

## 2019-04-12 MED ORDER — SODIUM CHLORIDE 0.9 % IV SOLN
Freq: Once | INTRAVENOUS | Status: AC
  Administered 2019-04-12: 10:00:00 via INTRAVENOUS
  Filled 2019-04-12: qty 250

## 2019-04-12 MED ORDER — DOXORUBICIN HCL CHEMO IV INJECTION 2 MG/ML
49.0000 mg/m2 | Freq: Once | INTRAVENOUS | Status: AC
  Administered 2019-04-12: 100 mg via INTRAVENOUS
  Filled 2019-04-12: qty 50

## 2019-04-12 MED ORDER — HEPARIN SOD (PORK) LOCK FLUSH 100 UNIT/ML IV SOLN
500.0000 [IU] | Freq: Once | INTRAVENOUS | Status: AC | PRN
  Administered 2019-04-12: 14:00:00 500 [IU]
  Filled 2019-04-12: qty 5

## 2019-04-12 MED ORDER — DIPHENHYDRAMINE HCL 25 MG PO CAPS
50.0000 mg | ORAL_CAPSULE | Freq: Once | ORAL | Status: AC
  Administered 2019-04-12: 10:00:00 50 mg via ORAL

## 2019-04-12 MED ORDER — SODIUM CHLORIDE 0.9% FLUSH
10.0000 mL | INTRAVENOUS | Status: DC | PRN
  Administered 2019-04-12: 14:00:00 10 mL
  Filled 2019-04-12: qty 10

## 2019-04-12 MED ORDER — SODIUM CHLORIDE 0.9 % IV SOLN
375.0000 mg/m2 | Freq: Once | INTRAVENOUS | Status: AC
  Administered 2019-04-12: 800 mg via INTRAVENOUS
  Filled 2019-04-12: qty 30

## 2019-04-12 MED ORDER — DEXAMETHASONE SODIUM PHOSPHATE 10 MG/ML IJ SOLN
INTRAMUSCULAR | Status: AC
  Filled 2019-04-12: qty 1

## 2019-04-12 NOTE — Patient Instructions (Signed)
Vincristine injection What is this medicine? VINCRISTINE (vin KRIS teen) is a chemotherapy drug. It slows the growth of cancer cells. This medicine is used to treat many types of cancer like Hodgkin's disease, leukemia, non-Hodgkin's lymphoma, neuroblastoma (brain cancer), rhabdomyosarcoma, and Wilms' tumor. This medicine may be used for other purposes; ask your health care provider or pharmacist if you have questions. COMMON BRAND NAME(S): Oncovin, Vincasar PFS What should I tell my health care provider before I take this medicine? They need to know if you have any of these conditions: -blood disorders -gout -infection (especially chickenpox, cold sores, or herpes) -kidney disease -liver disease -lung disease -nervous system disease like Charcot-Marie-Tooth (CMT) -recent or ongoing radiation therapy -an unusual or allergic reaction to vincristine, other chemotherapy agents, other medicines, foods, dyes, or preservatives -pregnant or trying to get pregnant -breast-feeding How should I use this medicine? This drug is given as an infusion into a vein. It is administered in a hospital or clinic by a specially trained health care professional. If you have pain, swelling, burning, or any unusual feeling around the site of your injection, tell your health care professional right away. Talk to your pediatrician regarding the use of this medicine in children. While this drug may be prescribed for selected conditions, precautions do apply. Overdosage: If you think you have taken too much of this medicine contact a poison control center or emergency room at once. NOTE: This medicine is only for you. Do not share this medicine with others. What if I miss a dose? It is important not to miss your dose. Call your doctor or health care professional if you are unable to keep an appointment. What may interact with this medicine? Do not take this medicine with any of the following  medications: -itraconazole -mibefradil -voriconazole This medicine may also interact with the following medications: -cyclosporine -erythromycin -fluconazole -ketoconazole -medicines for HIV like delavirdine, efavirenz, nevirapine -medicines for seizures like ethotoin, fosphenotoin, phenytoin -medicines to increase blood counts like filgrastim, pegfilgrastim, sargramostim -other chemotherapy drugs like cisplatin, L-asparaginase, methotrexate, mitomycin, paclitaxel -pegaspargase -vaccines -zalcitabine, ddC Talk to your doctor or health care professional before taking any of these medicines: -acetaminophen -aspirin -ibuprofen -ketoprofen -naproxen This list may not describe all possible interactions. Give your health care provider a list of all the medicines, herbs, non-prescription drugs, or dietary supplements you use. Also tell them if you smoke, drink alcohol, or use illegal drugs. Some items may interact with your medicine. What should I watch for while using this medicine? Your condition will be monitored carefully while you are receiving this medicine. You will need important blood work done while you are taking this medicine. This drug may make you feel generally unwell. This is not uncommon, as chemotherapy can affect healthy cells as well as cancer cells. Report any side effects. Continue your course of treatment even though you feel ill unless your doctor tells you to stop. In some cases, you may be given additional medicines to help with side effects. Follow all directions for their use. Call your doctor or health care professional for advice if you get a fever, chills or sore throat, or other symptoms of a cold or flu. Do not treat yourself. Avoid taking products that contain aspirin, acetaminophen, ibuprofen, naproxen, or ketoprofen unless instructed by your doctor. These medicines may hide a fever. Do not become pregnant while taking this medicine. Women should inform their  doctor if they wish to become pregnant or think they might be pregnant. There is a  potential for serious side effects to an unborn child. Talk to your health care professional or pharmacist for more information. Do not breast-feed an infant while taking this medicine. Men may have a lower sperm count while taking this medicine. Talk to your doctor if you plan to father a child. What side effects may I notice from receiving this medicine? Side effects that you should report to your doctor or health care professional as soon as possible: -allergic reactions like skin rash, itching or hives, swelling of the face, lips, or tongue -breathing problems -confusion or changes in emotions or moods -constipation -cough -mouth sores -muscle weakness -nausea and vomiting -pain, swelling, redness or irritation at the injection site -pain, tingling, numbness in the hands or feet -problems with balance, talking, walking -seizures -stomach pain -trouble passing urine or change in the amount of urine Side effects that usually do not require medical attention (report to your doctor or health care professional if they continue or are bothersome): -diarrhea -hair loss -jaw pain -loss of appetite This list may not describe all possible side effects. Call your doctor for medical advice about side effects. You may report side effects to FDA at 1-800-FDA-1088. Where should I keep my medicine? This drug is given in a hospital or clinic and will not be stored at home. NOTE: This sheet is a summary. It may not cover all possible information. If you have questions about this medicine, talk to your doctor, pharmacist, or health care provider.  2019 Elsevier/Gold Standard (2008-08-26 17:17:13) Doxorubicin injection What is this medicine? DOXORUBICIN (dox oh ROO bi sin) is a chemotherapy drug. It is used to treat many kinds of cancer like leukemia, lymphoma, neuroblastoma, sarcoma, and Wilms' tumor. It is also used  to treat bladder cancer, breast cancer, lung cancer, ovarian cancer, stomach cancer, and thyroid cancer. This medicine may be used for other purposes; ask your health care provider or pharmacist if you have questions. COMMON BRAND NAME(S): Adriamycin, Adriamycin PFS, Adriamycin RDF, Rubex What should I tell my health care provider before I take this medicine? They need to know if you have any of these conditions: -heart disease -history of low blood counts caused by a medicine -liver disease -recent or ongoing radiation therapy -an unusual or allergic reaction to doxorubicin, other chemotherapy agents, other medicines, foods, dyes, or preservatives -pregnant or trying to get pregnant -breast-feeding How should I use this medicine? This drug is given as an infusion into a vein. It is administered in a hospital or clinic by a specially trained health care professional. If you have pain, swelling, burning or any unusual feeling around the site of your injection, tell your health care professional right away. Talk to your pediatrician regarding the use of this medicine in children. Special care may be needed. Overdosage: If you think you have taken too much of this medicine contact a poison control center or emergency room at once. NOTE: This medicine is only for you. Do not share this medicine with others. What if I miss a dose? It is important not to miss your dose. Call your doctor or health care professional if you are unable to keep an appointment. What may interact with this medicine? This medicine may interact with the following medications: -6-mercaptopurine -paclitaxel -phenytoin -St. John's Wort -trastuzumab -verapamil This list may not describe all possible interactions. Give your health care provider a list of all the medicines, herbs, non-prescription drugs, or dietary supplements you use. Also tell them if you smoke, drink alcohol,  or use illegal drugs. Some items may interact  with your medicine. What should I watch for while using this medicine? This drug may make you feel generally unwell. This is not uncommon, as chemotherapy can affect healthy cells as well as cancer cells. Report any side effects. Continue your course of treatment even though you feel ill unless your doctor tells you to stop. There is a maximum amount of this medicine you should receive throughout your life. The amount depends on the medical condition being treated and your overall health. Your doctor will watch how much of this medicine you receive in your lifetime. Tell your doctor if you have taken this medicine before. You may need blood work done while you are taking this medicine. Your urine may turn red for a few days after your dose. This is not blood. If your urine is dark or brown, call your doctor. In some cases, you may be given additional medicines to help with side effects. Follow all directions for their use. Call your doctor or health care professional for advice if you get a fever, chills or sore throat, or other symptoms of a cold or flu. Do not treat yourself. This drug decreases your body's ability to fight infections. Try to avoid being around people who are sick. This medicine may increase your risk to bruise or bleed. Call your doctor or health care professional if you notice any unusual bleeding. Talk to your doctor about your risk of cancer. You may be more at risk for certain types of cancers if you take this medicine. Do not become pregnant while taking this medicine or for 6 months after stopping it. Women should inform their doctor if they wish to become pregnant or think they might be pregnant. Men should not father a child while taking this medicine and for 6 months after stopping it. There is a potential for serious side effects to an unborn child. Talk to your health care professional or pharmacist for more information. Do not breast-feed an infant while taking this  medicine. This medicine has caused ovarian failure in some women and reduced sperm counts in some men This medicine may interfere with the ability to have a child. Talk with your doctor or health care professional if you are concerned about your fertility. This medicine may cause a decrease in Co-Enzyme Q-10. You should make sure that you get enough Co-Enzyme Q-10 while you are taking this medicine. Discuss the foods you eat and the vitamins you take with your health care professional. What side effects may I notice from receiving this medicine? Side effects that you should report to your doctor or health care professional as soon as possible: -allergic reactions like skin rash, itching or hives, swelling of the face, lips, or tongue -breathing problems -chest pain -fast or irregular heartbeat -low blood counts - this medicine may decrease the number of white blood cells, red blood cells and platelets. You may be at increased risk for infections and bleeding. -pain, redness, or irritation at site where injected -signs of infection - fever or chills, cough, sore throat, pain or difficulty passing urine -signs of decreased platelets or bleeding - bruising, pinpoint red spots on the skin, black, tarry stools, blood in the urine -swelling of the ankles, feet, hands -tiredness -weakness Side effects that usually do not require medical attention (report to your doctor or health care professional if they continue or are bothersome): -diarrhea -hair loss -mouth sores -nail discoloration or damage -nausea -red colored  urine -vomiting This list may not describe all possible side effects. Call your doctor for medical advice about side effects. You may report side effects to FDA at 1-800-FDA-1088. Where should I keep my medicine? This drug is given in a hospital or clinic and will not be stored at home. NOTE: This sheet is a summary. It may not cover all possible information. If you have questions  about this medicine, talk to your doctor, pharmacist, or health care provider.  2019 Elsevier/Gold Standard (2017-07-13 11:01:26) Cyclophosphamide injection What is this medicine? CYCLOPHOSPHAMIDE (sye kloe FOSS fa mide) is a chemotherapy drug. It slows the growth of cancer cells. This medicine is used to treat many types of cancer like lymphoma, myeloma, leukemia, breast cancer, and ovarian cancer, to name a few. This medicine may be used for other purposes; ask your health care provider or pharmacist if you have questions. COMMON BRAND NAME(S): Cytoxan, Neosar What should I tell my health care provider before I take this medicine? They need to know if you have any of these conditions: -blood disorders -history of other chemotherapy -infection -kidney disease -liver disease -recent or ongoing radiation therapy -tumors in the bone marrow -an unusual or allergic reaction to cyclophosphamide, other chemotherapy, other medicines, foods, dyes, or preservatives -pregnant or trying to get pregnant -breast-feeding How should I use this medicine? This drug is usually given as an injection into a vein or muscle or by infusion into a vein. It is administered in a hospital or clinic by a specially trained health care professional. Talk to your pediatrician regarding the use of this medicine in children. Special care may be needed. Overdosage: If you think you have taken too much of this medicine contact a poison control center or emergency room at once. NOTE: This medicine is only for you. Do not share this medicine with others. What if I miss a dose? It is important not to miss your dose. Call your doctor or health care professional if you are unable to keep an appointment. What may interact with this medicine? This medicine may interact with the following medications: -amiodarone -amphotericin B -azathioprine -certain antiviral medicines for HIV or AIDS such as protease inhibitors (e.g.,  indinavir, ritonavir) and zidovudine -certain blood pressure medications such as benazepril, captopril, enalapril, fosinopril, lisinopril, moexipril, monopril, perindopril, quinapril, ramipril, trandolapril -certain cancer medications such as anthracyclines (e.g., daunorubicin, doxorubicin), busulfan, cytarabine, paclitaxel, pentostatin, tamoxifen, trastuzumab -certain diuretics such as chlorothiazide, chlorthalidone, hydrochlorothiazide, indapamide, metolazone -certain medicines that treat or prevent blood clots like warfarin -certain muscle relaxants such as succinylcholine -cyclosporine -etanercept -indomethacin -medicines to increase blood counts like filgrastim, pegfilgrastim, sargramostim -medicines used as general anesthesia -metronidazole -natalizumab This list may not describe all possible interactions. Give your health care provider a list of all the medicines, herbs, non-prescription drugs, or dietary supplements you use. Also tell them if you smoke, drink alcohol, or use illegal drugs. Some items may interact with your medicine. What should I watch for while using this medicine? Visit your doctor for checks on your progress. This drug may make you feel generally unwell. This is not uncommon, as chemotherapy can affect healthy cells as well as cancer cells. Report any side effects. Continue your course of treatment even though you feel ill unless your doctor tells you to stop. Drink water or other fluids as directed. Urinate often, even at night. In some cases, you may be given additional medicines to help with side effects. Follow all directions for their use. Call your doctor or  health care professional for advice if you get a fever, chills or sore throat, or other symptoms of a cold or flu. Do not treat yourself. This drug decreases your body's ability to fight infections. Try to avoid being around people who are sick. This medicine may increase your risk to bruise or bleed. Call  your doctor or health care professional if you notice any unusual bleeding. Be careful brushing and flossing your teeth or using a toothpick because you may get an infection or bleed more easily. If you have any dental work done, tell your dentist you are receiving this medicine. You may get drowsy or dizzy. Do not drive, use machinery, or do anything that needs mental alertness until you know how this medicine affects you. Do not become pregnant while taking this medicine or for 1 year after stopping it. Women should inform their doctor if they wish to become pregnant or think they might be pregnant. Men should not father a child while taking this medicine and for 4 months after stopping it. There is a potential for serious side effects to an unborn child. Talk to your health care professional or pharmacist for more information. Do not breast-feed an infant while taking this medicine. This medicine may interfere with the ability to have a child. This medicine has caused ovarian failure in some women. This medicine has caused reduced sperm counts in some men. You should talk with your doctor or health care professional if you are concerned about your fertility. If you are going to have surgery, tell your doctor or health care professional that you have taken this medicine. What side effects may I notice from receiving this medicine? Side effects that you should report to your doctor or health care professional as soon as possible: -allergic reactions like skin rash, itching or hives, swelling of the face, lips, or tongue -low blood counts - this medicine may decrease the number of white blood cells, red blood cells and platelets. You may be at increased risk for infections and bleeding. -signs of infection - fever or chills, cough, sore throat, pain or difficulty passing urine -signs of decreased platelets or bleeding - bruising, pinpoint red spots on the skin, black, tarry stools, blood in the  urine -signs of decreased red blood cells - unusually weak or tired, fainting spells, lightheadedness -breathing problems -dark urine -dizziness -palpitations -swelling of the ankles, feet, hands -trouble passing urine or change in the amount of urine -weight gain -yellowing of the eyes or skin Side effects that usually do not require medical attention (report to your doctor or health care professional if they continue or are bothersome): -changes in nail or skin color -hair loss -missed menstrual periods -mouth sores -nausea, vomiting This list may not describe all possible side effects. Call your doctor for medical advice about side effects. You may report side effects to FDA at 1-800-FDA-1088. Where should I keep my medicine? This drug is given in a hospital or clinic and will not be stored at home. NOTE: This sheet is a summary. It may not cover all possible information. If you have questions about this medicine, talk to your doctor, pharmacist, or health care provider.  2019 Elsevier/Gold Standard (2012-10-13 16:22:58) Rituximab injection What is this medicine? RITUXIMAB (ri TUX i mab) is a monoclonal antibody. It is used to treat certain types of cancer like non-Hodgkin lymphoma and chronic lymphocytic leukemia. It is also used to treat rheumatoid arthritis, granulomatosis with polyangiitis (or Wegener's granulomatosis), microscopic polyangiitis, and  pemphigus vulgaris. This medicine may be used for other purposes; ask your health care provider or pharmacist if you have questions. COMMON BRAND NAME(S): Rituxan What should I tell my health care provider before I take this medicine? They need to know if you have any of these conditions: -heart disease -infection (especially a virus infection such as hepatitis B, chickenpox, cold sores, or herpes) -immune system problems -irregular heartbeat -kidney disease -low blood counts, like low white cell, platelet, or red cell  counts -lung or breathing disease, like asthma -recently received or scheduled to receive a vaccine -an unusual or allergic reaction to rituximab, other medicines, foods, dyes, or preservatives -pregnant or trying to get pregnant -breast-feeding How should I use this medicine? This medicine is for infusion into a vein. It is administered in a hospital or clinic by a specially trained health care professional. A special MedGuide will be given to you by the pharmacist with each prescription and refill. Be sure to read this information carefully each time. Talk to your pediatrician regarding the use of this medicine in children. This medicine is not approved for use in children. Overdosage: If you think you have taken too much of this medicine contact a poison control center or emergency room at once. NOTE: This medicine is only for you. Do not share this medicine with others. What if I miss a dose? It is important not to miss a dose. Call your doctor or health care professional if you are unable to keep an appointment. What may interact with this medicine? -cisplatin -live virus vaccines This list may not describe all possible interactions. Give your health care provider a list of all the medicines, herbs, non-prescription drugs, or dietary supplements you use. Also tell them if you smoke, drink alcohol, or use illegal drugs. Some items may interact with your medicine. What should I watch for while using this medicine? Your condition will be monitored carefully while you are receiving this medicine. You may need blood work done while you are taking this medicine. This medicine can cause serious allergic reactions. To reduce your risk you may need to take medicine before treatment with this medicine. Take your medicine as directed. In some patients, this medicine may cause a serious brain infection that may cause death. If you have any problems seeing, thinking, speaking, walking, or standing, tell  your healthcare professional right away. If you cannot reach your healthcare professional, urgently seek other source of medical care. Call your doctor or health care professional for advice if you get a fever, chills or sore throat, or other symptoms of a cold or flu. Do not treat yourself. This drug decreases your body's ability to fight infections. Try to avoid being around people who are sick. Do not become pregnant while taking this medicine or for 12 months after stopping it. Women should inform their doctor if they wish to become pregnant or think they might be pregnant. There is a potential for serious side effects to an unborn child. Talk to your health care professional or pharmacist for more information. Do not breast-feed an infant while taking this medicine or for 6 months after stopping it. What side effects may I notice from receiving this medicine? Side effects that you should report to your doctor or health care professional as soon as possible: -allergic reactions like skin rash, itching or hives; swelling of the face, lips, or tongue -breathing problems -chest pain -changes in vision -diarrhea -headache with fever, neck stiffness, sensitivity to light, nausea,  or confusion -fast, irregular heartbeat -loss of memory -low blood counts - this medicine may decrease the number of white blood cells, red blood cells and platelets. You may be at increased risk for infections and bleeding. -mouth sores -problems with balance, talking, or walking -redness, blistering, peeling or loosening of the skin, including inside the mouth -signs of infection - fever or chills, cough, sore throat, pain or difficulty passing urine -signs and symptoms of kidney injury like trouble passing urine or change in the amount of urine -signs and symptoms of liver injury like dark yellow or brown urine; general ill feeling or flu-like symptoms; light-colored stools; loss of appetite; nausea; right upper belly  pain; unusually weak or tired; yellowing of the eyes or skin -signs and symptoms of low blood pressure like dizziness; feeling faint or lightheaded, falls; unusually weak or tired -stomach pain -swelling of the ankles, feet, hands -unusual bleeding or bruising -vomiting Side effects that usually do not require medical attention (report to your doctor or health care professional if they continue or are bothersome): -headache -joint pain -muscle cramps or muscle pain -nausea -tiredness This list may not describe all possible side effects. Call your doctor for medical advice about side effects. You may report side effects to FDA at 1-800-FDA-1088. Where should I keep my medicine? This drug is given in a hospital or clinic and will not be stored at home. NOTE: This sheet is a summary. It may not cover all possible information. If you have questions about this medicine, talk to your doctor, pharmacist, or health care provider.  2019 Elsevier/Gold Standard (2017-11-11 13:04:32)

## 2019-04-12 NOTE — Progress Notes (Signed)
[place] OFFICE PROGRESS NOTE  Patient Care Team: Maury Dus, MD as PCP - General (Family Medicine)  HEME/ONC OVERVIEW: 1. 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, GCB subtype; poor prognosis by R-IPI; intermediate risk by CNS-IPI  -12/2018: CT showed cervical lymphadenopathy (left supraclavicular LN ~3 x 1.5cm), abnormal soft tissue masses surrounding bilateral kidneys (R 4.0 x 3.2cm; L 3 x 2.5cm) encasing the ureters, RP adenopathy (largest 3.4cm), and R internal iliac lymphadenopathy (conglomerate 4.5 x 2.5cm) -01/2019:   PET showed extensive FDG-avid adenopathy in the cervical, paratracheal, peri-aortic, mesenteric and iliac LN's, as well as extensive skeletal involvement throughout the axial and appendicular skeleton; expansile soft tissue lesions in the left scapula and right sacrum  L supraclavicular LN bx showed DLBCL, Ki-67 70-80%, GCB subtype; FISH positive for Bcl-2 rearrangement (41.5%) but negative for Bcl-6 and Myc rearrangement   Normal LVEF on TTE   MRI brain negative for any CNS involvement -Mid-01/2019 - present: R-CHOP with Udenyca, plan for 6 cycles   HD MTX added with Cycles 2, 4 and 6 of R-CHOP at [facility]   PET after 3 cycles of chemotherapy showed marked response   TREATMENT REGIMEN:  02/01/2019 - present: R-CHOP with Udenyca, plan for 6 cycles  IT MTX with Cycle 1; HD MTX with Cycles 2, 4 and 6 of chemotherapy at [place]:   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; GCB subtype  -S/p 3 cycles of R-CHOP with CNS prophylaxis -I independently reviewed the his radiologic images of recent PET, and agree with findings as documented; in summary, PET after 3 cycles of chemotherapy showed marked response, including improvement in the lymphadenopathy and bony lesions -Labs adequate today, proceed with Cycle 4 of chemotherapy -2nd dose of HD MTX due at [facility], date TBD; I discussed the case  with Dr. Cassell Clement, who plans to reduce the dose of MTX after recent AKI following HD MTX with Cycle 2 of R-CHOP -PRN anti-emetics: Zofran, Compazine, Ativan and dexamethasone -Prophylaxis: allopurinol, acyclovir   CNS prophylaxis -On HD MTX with Cycles 2, 4 and 6 of R-CHOP at [facility] under the guidance of Dr. Cassell Clement   Chemotherapy-associated anemia -Secondary to chemotherapy -Hgb 10.6, stable -Patient denies any symptom of bleeding -We will monitor for now; no indication for dose adjustment -If anemia worsens in the future, we will consider delaying chemotherapy or adjusting chemotherapy dose  R lower extremity pain  -Overall improving with chemotherapy -Continue ROM exercises to improve mobility and PRN ACE wraps for swelling -Unless the patient develops worsening pain or other concerning symptoms, we will defer further imaging studies for now -On gabapentin and PRN tramadol   Oral candidiasis -On maintenance fluconazole during chemotherapy due to recurrent oral candidiasis   No orders of the defined types were placed in this encounter.  All questions were answered. The patient knows to call the clinic with any problems, questions or concerns. No barriers to learning was detected.  Return in 3 weeks for port flush, labs, clinic appt and Cycle 5 of R-CHOP.   Return in 3 w Tish Men, MD 04/12/2019 12:40 PM  CHIEF COMPLAINT: "I am doing fine"  INTERVAL HISTORY: Patient reports that he has been doing relatively well since the last treatment.  He still has some mild intermittent pain in the right ankle with some swelling, but overall the pain and swelling are improving.  He has not been using Ace wrap for the ankle swelling.  He  takes occasional medication for pain in the right ankle.  He denies any other complaint today.  SUMMARY OF ONCOLOGIC HISTORY:   Diffuse large B-cell lymphoma ([place])   12/31/2018 Imaging    CT neck: IMPRESSION: Enlarged cervical lymph nodes in the neck  as above. Most prominent nodes are in the left supraclavicular region. This may be due to metastatic disease or lymphoma. No primary head and neck cancer identified.    12/31/2018 Imaging    CTA chest: IMPRESSION: 1. 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 Abnormal soft tissue associated with both kidneys and surrounding the proximal left ureter is also likely due to lymphoma. This soft tissue associated with the kidneys and left ureter results in mild bilateral hydronephrosis, left greater than right. 2. Small pleural effusions, mild pulmonary edema, ascites, and fluid and stranding in the mesentery is all likely due to third spacing of fluid. The mesenteric findings surrounds the pancreas but is probably part of the broader process of fluid third-spacing. Recommend clinical correlation to exclude signs of pancreatitis. If there is any concern for pancreatitis, recommend obtaining a lipase. 3. Small pulmonary nodules as above. 4. No pulmonary emboli. 5. Coronary artery calcifications.    12/31/2018 Imaging    CT abdomen/pelvis: IMPRESSION: 1. 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 Abnormal soft tissue associated with both kidneys and surrounding the proximal left ureter is also likely due to lymphoma. This soft tissue associated with the kidneys and left ureter results in mild bilateral hydronephrosis, left greater than right. 2. Small pleural effusions, mild pulmonary edema, ascites, and fluid and stranding in the mesentery is all likely due to third spacing of fluid. The mesenteric findings surrounds the pancreas but is probably part of the broader process of fluid third-spacing. Recommend clinical correlation to exclude signs of pancreatitis. If there is any concern for pancreatitis, recommend obtaining a lipase. 3. Small pulmonary nodules  as above. 4. No pulmonary emboli. 5. Coronary artery calcifications.    01/01/2019 Initial Diagnosis    Diffuse large B-cell lymphoma ([place])    01/12/2019 Procedure    Left supraclavicular LN biopsy by Dr. Dalbert Batman    01/12/2019 Pathology Results    Accession: [id number]  Tissue-Flow Cytometry - MONOCLONAL B-CELL POPULATION WITH EXPRESSION OF CD10 COMPRISES 80% OF ALL LYMPHOCYTES - SEE COMMENT    01/12/2019 Pathology Results    Accession: [id number]  Lymph node for lymphoma, Left supraclavicular - DIFFUSE LARGE B-CELL LYMPHOMA - SEE COMMENT Microscopic Comment The excisional biopsy material is composed diffuse lymphoid proliferation. The lymphocytes are medium to large with vesicular chromatin, inconspicuous to prominent nucleoli, scant cytoplasm and admixed small benign lymphocytes. The atypical cells are B-cells by CD20 immunohistochemistry with expression of CD10, bcl-6, and bcl-2. The proliferative rate is high by ki-67 (70-80%). The neoplastic cells do not express CD5 or EBV by in-situ hybridization. CD3 highlights background T-cells. Flow cytometry performed on the sample identified a kappa-restricted B-cell population with expression of CD10 that comprised 80% of all lymphocytes (See [id number]).    01/15/2019 Imaging    PET: IMPRESSION: 1. Extensive hypermetabolic adenopathy in the cervical lymph nodes, paratracheal nodes, periaortic nodes and iliac nodes. 2. Hypermetabolic nodal metastasis within the mesentery of the small bowel colon. 3. Extensive skeletal metastasis within the axillary and appendicular skeleton with intense metabolic activity. Expansile soft tissue lesions in the superior aspect of the LEFT scapula and RIGHT sacrum. These  expansile soft tissue bone metastasis may be best targets for biopsy. 4. Normal spleen.     02/01/2019 -  Chemotherapy    The patient had DOXOrubicin (ADRIAMYCIN) chemo injection 100 mg, 49.5 mg/m2 = 102 mg, Intravenous,  Once, 4 of  6 cycles Administration: 100 mg (02/01/2019), 100 mg (02/22/2019), 100 mg (03/22/2019) palonosetron (ALOXI) injection 0.25 mg, 0.25 mg, Intravenous,  Once, 4 of 6 cycles Administration: 0.25 mg (02/01/2019), 0.25 mg (02/22/2019), 0.25 mg (03/22/2019) pegfilgrastim-cbqv (UDENYCA) injection 6 mg, 6 mg, Subcutaneous, Once, 4 of 6 cycles Administration: 6 mg (02/02/2019), 6 mg (02/23/2019), 6 mg (03/23/2019) vinCRIStine (ONCOVIN) 2 mg in sodium chloride 0.9 % 50 mL chemo infusion, 2 mg, Intravenous,  Once, 4 of 6 cycles Administration: 2 mg (02/01/2019), 2 mg (02/22/2019), 2 mg (03/22/2019) methotrexate (PF) 12 mg in sodium chloride (PF) 0.9 % INTRATHECAL chemo injection, , Intrathecal,  Once, 1 of 1 cycle Administration:  (02/05/2019) riTUXimab (RITUXAN) 800 mg in sodium chloride 0.9 % 250 mL (2.4242 mg/mL) infusion, 375 mg/m2 = 800 mg, Intravenous,  Once, 1 of 1 cycle Administration: 800 mg (02/01/2019) cyclophosphamide (CYTOXAN) 1,540 mg in sodium chloride 0.9 % 250 mL chemo infusion, 750 mg/m2 = 1,540 mg, Intravenous,  Once, 4 of 6 cycles Administration: 1,540 mg (02/01/2019), 1,540 mg (02/22/2019), 1,540 mg (03/22/2019) riTUXimab (RITUXAN) 800 mg in sodium chloride 0.9 % 170 mL infusion, 375 mg/m2 = 800 mg (100 % of original dose 375 mg/m2), Intravenous,  Once, 3 of 5 cycles Dose modification: 375 mg/m2 (original dose 375 mg/m2, Cycle 2, Reason: Provider Judgment, Comment: Changing to rapid infusion, tolerated 1st dose well) Administration: 800 mg (02/22/2019), 800 mg (03/22/2019)  for chemotherapy treatment.     03/29/2019 Imaging    PET (after 3 cycles of R-CHOP): IMPRESSION: 1. Marked response to therapy of lymphoma, including significant improvement and resolution of nodal disease within the neck, chest, abdomen, and pelvis. ([place] 3). 2. The dominant left scapular and right sacral lesions are significantly improved/resolved. There is increased heterogeneous marrow activity throughout, favored to be due  to stimulation by chemotherapy. 3. Age advanced coronary artery atherosclerosis. Recommend assessment of coronary risk factors and consideration of medical therapy. 4.  Aortic Atherosclerosis (ICD10-I70.0).     REVIEW OF SYSTEMS:   Constitutional: ( - ) fevers, ( - )  chills , ( - ) night sweats Eyes: ( - ) blurriness of vision, ( - ) double vision, ( - ) watery eyes Ears, nose, mouth, throat, and face: ( - ) mucositis, ( - ) sore throat Respiratory: ( - ) cough, ( - ) dyspnea, ( - ) wheezes Cardiovascular: ( - ) palpitation, ( - ) chest discomfort, ( - ) lower extremity swelling Gastrointestinal:  ( - ) nausea, ( - ) heartburn, ( - ) change in bowel habits Skin: ( - ) abnormal skin rashes Lymphatics: ( - ) new lymphadenopathy, ( - ) easy bruising Neurological: ( - ) numbness, ( - ) tingling, ( - ) new weaknesses Behavioral/Psych: ( - ) mood change, ( - ) new changes  All other systems were reviewed with the patient and are negative.  I have reviewed the past medical history, past surgical history, social history and family history with the patient and they are unchanged from previous note.  ALLERGIES:  is allergic to penicillins.  MEDICATIONS:  Current Outpatient Medications  Medication Sig Dispense Refill  . acetaminophen (TYLENOL) 325 MG tablet Take 975 mg by mouth every 6 (six) hours as needed for  mild pain.    Marland Kitchen acyclovir (ZOVIRAX) 400 MG tablet Take 1 tablet (400 mg total) by mouth 2 (two) times daily. 180 tablet 3  . diclofenac sodium (VOLTAREN) 1 % GEL Apply 2 g topically 4 (four) times daily for 30 days. 1 Tube 5  . docusate sodium (COLACE) 100 MG capsule Take 100 mg by mouth 2 (two) times daily.    . ferrous sulfate 325 (65 FE) MG EC tablet Take 325 mg by mouth daily.     Marland Kitchen lidocaine-prilocaine (EMLA) cream Apply to affected area once 30 g 3  . LORazepam (ATIVAN) 0.5 MG tablet Take 1 tablet (0.5 mg total) by mouth every 6 (six) hours as needed (Nausea or vomiting). 30  tablet 0  . magic mouthwash w/lidocaine SOLN Take 5 mLs by mouth 4 (four) times daily as needed for mouth pain. 200 mL 5  . ondansetron (ZOFRAN) 8 MG tablet Take 1 tablet (8 mg total) by mouth 2 (two) times daily as needed for refractory nausea / vomiting. Start on day 3 after cyclophosphamide chemotherapy. 30 tablet 1  . prochlorperazine (COMPAZINE) 10 MG tablet Take 1 tablet (10 mg total) by mouth every 6 (six) hours as needed (Nausea or vomiting). 30 tablet 6  . traMADol (ULTRAM) 50 MG tablet Take 1 tablet (50 mg total) by mouth every 8 (eight) hours as needed. 90 tablet 0  . allopurinol (ZYLOPRIM) 100 MG tablet Take 1 tablet (100 mg total) by mouth daily for 30 days. 30 tablet 5  . gabapentin (NEURONTIN) 300 MG capsule Take 1 capsule (300 mg total) by mouth 2 (two) times daily for 30 days. 60 capsule 3   No current facility-administered medications for this visit.    Facility-Administered Medications Ordered in Other Visits  Medication Dose Route Frequency Provider Last Rate Last Dose  . heparin lock flush 100 unit/mL  500 Units Intracatheter Once PRN Tish Men, MD      . sodium chloride flush (NS) 0.9 % injection 10 mL  10 mL Intracatheter PRN Tish Men, MD        PHYSICAL EXAMINATION: ECOG PERFORMANCE STATUS: 1 - Symptomatic but completely ambulatory  Today's Vitals   04/12/19 0938 04/12/19 0958  PainSc: 0-No pain 0-No pain   There is no height or weight on file to calculate BMI.  There were no vitals filed for this visit.  GENERAL: alert, no distress and comfortable SKIN: skin color, texture, turgor are normal, no rashes or significant lesions EYES: conjunctiva are pink and non-injected, sclera clear OROPHARYNX: no exudate, no erythema; lips, buccal mucosa, and tongue normal  NECK: supple, non-tender LYMPH:  no palpable lymphadenopathy in the cervical LUNGS: clear to auscultation with normal breathing effort HEART: regular rate & rhythm and no murmurs, trace right ankle  edema ABDOMEN: soft, non-tender, non-distended, normal bowel sounds Musculoskeletal: no cyanosis of digits and no clubbing  PSYCH: alert & oriented x 3, fluent speech NEURO: no focal motor/sensory deficits  LABORATORY DATA:  I have reviewed the data as listed    Component Value Date/Time   NA 140 04/12/2019 0920   K 4.1 04/12/2019 0920   CL 102 04/12/2019 0920   CO2 29 04/12/2019 0920   GLUCOSE 136 (H) 04/12/2019 0920   BUN 24 (H) 04/12/2019 0920   CREATININE 0.95 04/12/2019 0920   CALCIUM 9.5 04/12/2019 0920   PROT 6.6 04/12/2019 0920   ALBUMIN 4.4 04/12/2019 0920   AST 13 (L) 04/12/2019 0920   ALT 21 04/12/2019 0920  ALKPHOS 73 04/12/2019 0920   BILITOT 0.3 04/12/2019 0920   GFRNONAA >60 04/12/2019 0920   GFRAA >60 04/12/2019 0920    No results found for: SPEP, UPEP  Lab Results  Component Value Date   WBC 8.9 04/12/2019   NEUTROABS 7.8 (H) 04/12/2019   HGB 10.6 (L) 04/12/2019   HCT 31.9 (L) 04/12/2019   MCV 87.9 04/12/2019   PLT 228 04/12/2019      Chemistry      Component Value Date/Time   NA 140 04/12/2019 0920   K 4.1 04/12/2019 0920   CL 102 04/12/2019 0920   CO2 29 04/12/2019 0920   BUN 24 (H) 04/12/2019 0920   CREATININE 0.95 04/12/2019 0920      Component Value Date/Time   CALCIUM 9.5 04/12/2019 0920   ALKPHOS 73 04/12/2019 0920   AST 13 (L) 04/12/2019 0920   ALT 21 04/12/2019 0920   BILITOT 0.3 04/12/2019 0920       RADIOGRAPHIC STUDIES: I have personally reviewed the radiological images as listed below and agreed with the findings in the report. Nm Pet Image Restag (ps) Skull Base To Thigh  Result Date: 03/29/2019 CLINICAL DATA:  Subsequent treatment strategy for restaging of diffuse B-cell lymphoma. EXAM: NUCLEAR MEDICINE PET SKULL BASE TO THIGH TECHNIQUE: 9.8 mCi F-18 FDG was injected intravenously. Full-ring PET imaging was performed from the skull base to thigh after the radiotracer. CT data was obtained and used for attenuation  correction and anatomic localization. Fasting blood glucose: 121 mg/dl COMPARISON:  PET of 01/15/2019. Most recent abdominopelvic CT 02/15/2019. FINDINGS: Mediastinal blood pool activity: SUV max 2.2 Hepatic activity:  S.U.V. max 3.2 NECK: Hypermetabolism about the posterior aspect left maxillary sinus measures a S.U.V. max of 7.4 today versus a S.U.V. max of 11.2 on the prior. Significant improvement to resolution of cervical nodal hypermetabolism. Right level 2 hypermetabolism measures a S.U.V. max of 2.4 today versus a S.U.V. max of 9.5 on the prior exam. Incidental CT findings: No cervical adenopathy. CHEST: The hypermetabolic high paratracheal adenopathy has resolved. No pulmonary parenchymal or thoracic nodal hypermetabolism. Incidental CT findings: Right Port-A-Cath tip at high right atrium. Lad coronary artery calcification. ABDOMEN/PELVIS: Retrocaval nodal tissue measures 8 mm and a S.U.V. max of 2.4 today versus 2.3 cm and a S.U.V. max of 16.7 on the prior exam. The small bowel mesenteric nodal mass has resolved. Right posterior pelvic nodal tissue measures 1.9 cm and a S.U.V. max of 2.0 on image 182/4. Compare 2.6 cm and a S.U.V. max of 12.9 on the prior exam (when remeasured). Incidental CT findings: Aortic atherosclerosis. Resolved hydronephrosis. Interpolar left renal cyst. SKELETON: The right sacral hypermetabolic lesion on the prior is identified on image 178/4 today. No longer hypermetabolic. Similarly, left scapular hypermetabolism measures a S.U.V. max of 5.1 today versus a S.U.V. max of 25.6 on the prior exam. The osseous and surrounding soft tissue mass has resolved. There is a pattern of diffuse marrow hypermetabolism which is felt to be increased throughout the thoracolumbar spine. Incidental CT findings: none IMPRESSION: 1. Marked response to therapy of lymphoma, including significant improvement and resolution of nodal disease within the neck, chest, abdomen, and pelvis. ([place] 3). 2.  The dominant left scapular and right sacral lesions are significantly improved/resolved. There is increased heterogeneous marrow activity throughout, favored to be due to stimulation by chemotherapy. 3. Age advanced coronary artery atherosclerosis. Recommend assessment of coronary risk factors and consideration of medical therapy. 4.  Aortic Atherosclerosis (ICD10-I70.0). Electronically Signed  By: Abigail Miyamoto M.D.   On: 03/29/2019 13:59

## 2019-04-13 ENCOUNTER — Inpatient Hospital Stay: Payer: BC Managed Care – PPO | Attending: Hematology

## 2019-04-13 VITALS — BP 144/85 | HR 77 | Temp 98.0°F | Resp 16

## 2019-04-13 DIAGNOSIS — Z5189 Encounter for other specified aftercare: Secondary | ICD-10-CM | POA: Insufficient documentation

## 2019-04-13 DIAGNOSIS — Z5111 Encounter for antineoplastic chemotherapy: Secondary | ICD-10-CM | POA: Insufficient documentation

## 2019-04-13 DIAGNOSIS — C8338 Diffuse large B-cell lymphoma, lymph nodes of multiple sites: Secondary | ICD-10-CM | POA: Diagnosis present

## 2019-04-13 DIAGNOSIS — Z5112 Encounter for antineoplastic immunotherapy: Secondary | ICD-10-CM | POA: Insufficient documentation

## 2019-04-13 MED ORDER — PEGFILGRASTIM-CBQV 6 MG/0.6ML ~~LOC~~ SOSY
6.0000 mg | PREFILLED_SYRINGE | Freq: Once | SUBCUTANEOUS | Status: AC
  Administered 2019-04-13: 14:00:00 6 mg via SUBCUTANEOUS

## 2019-04-13 MED ORDER — PEGFILGRASTIM-CBQV 6 MG/0.6ML ~~LOC~~ SOSY
PREFILLED_SYRINGE | SUBCUTANEOUS | Status: AC
  Filled 2019-04-13: qty 0.6

## 2019-04-13 NOTE — Patient Instructions (Signed)
Neutropenia Neutropenia is a condition that occurs when you have a lower-than-normal level of a type of white blood cell (neutrophil) in your body. Neutrophils are made in the spongy center of large bones (bone marrow) and they fight infections. Neutrophils are your body's main defense against bacterial and fungal infections. The fewer neutrophils you have and the longer your body remains without them, the greater your risk of getting a severe infection. What are the causes? This condition can occur if your body uses up or destroys neutrophils faster than your bone marrow can make them. This problem may happen because of:  Bacterial or fungal infection.  Allergic disorders.  Reactions to some medicines.  Autoimmune disease.  An enlarged spleen. This condition can also occur if your bone marrow does not produce enough neutrophils. This problem may be caused by:  Cancer.  Cancer treatments, such as radiation or chemotherapy.  Viral infections.  Medicines, such as phenytoin.  Vitamin B12 deficiency.  Diseases of the bone marrow.  Environmental toxins, such as insecticides. What are the signs or symptoms? This condition does not usually cause symptoms. If symptoms are present, they are usually caused by an underlying infection. Symptoms of an infection may include:  Fever.  Chills.  Swollen glands.  Oral or anal ulcers.  Cough and shortness of breath.  Rash.  Skin infection.  Fatigue. How is this diagnosed? Your health care provider may suspect neutropenia if you have:  A condition that may cause neutropenia.  Symptoms of infection, especially fever.  Frequent and unusual infections. You will have a medical history and physical exam. Tests will also be done, such as:  A complete blood count (CBC).  A procedure to collect a sample of bone marrow for examination (bone marrow biopsy).  A chest X-ray.  A urine culture.  A blood culture. How is this treated?  Treatment depends on the underlying cause and severity of your condition. Mild neutropenia may not require treatment. Treatment may include medicines, such as:  Antibiotic medicine given through an IV tube.  Antiviral medicines.  Antifungal medicines.  A medicine to increase neutrophil production (colony-stimulating factor). You may get this drug through an IV tube or by injection.  Steroids given through an IV tube. If an underlying condition is causing neutropenia, you may need treatment for that condition. If medicines you are taking are causing neutropenia, your health care provider may have you stop taking those medicines. Follow these instructions at home: Medicines   Take over-the-counter and prescription medicines only as told by your health care provider.  Get a seasonal flu shot (influenza vaccine). Lifestyle  Do not eat unpasteurized foods.Do not eat unwashed raw fruits or vegetables.  Avoid exposure to groups of people or children.  Avoid being around people who are sick.  Avoid being around dirt or dust, such as in construction areas or gardens.  Do not provide direct care for pets. Avoid animal droppings. Do not clean litter boxes and bird cages. Hygiene   Bathe daily.  Clean the area between the genitals and the anus (perineal area) after you urinate or have a bowel movement. If you are male, wipe from front to back.  Brush your teeth with a soft toothbrush before and after meals.  Do not use a razor that has a blade. Use an electric razor to remove hair.  Wash your hands often. Make sure others who come in contact with you also wash their hands. If soap and water are not available, use hand  sanitizer. General instructions  Do not have sex unless your health care provider has approved.  Take actions to avoid cuts and burns. For example: ? Be cautious when you use knives. Always cut away from yourself. ? Keep knives in protective sheaths or guards when  not in use. ? Use oven mitts when you cook with a hot stove, oven, or grill. ? Stand a safe distance away from open fires.  Avoid people who received a vaccine in the past 30 days if that vaccine contained a live version of the germ (live vaccine). You should not get a live vaccine. Common live vaccines are varicella, measles, mumps, and rubella.  Do not share food utensils.  Do not use tampons, enemas, or rectal suppositories unless your health care provider has approved.  Keep all appointments as told by your health care provider. This is important. Contact a health care provider if:  You have a fever.  You have chills or you start to shake.  You have: ? A sore throat. ? A warm, red, or tender area on your skin. ? A cough. ? Frequent or painful urination. ? Vaginal discharge or itching.  You develop: ? Sores in your mouth or anus. ? Swollen lymph nodes. ? Red streaks on the skin. ? A rash.  You feel: ? Nauseous or you vomit. ? Very fatigued. ? Short of breath. This information is not intended to replace advice given to you by your health care provider. Make sure you discuss any questions you have with your health care provider. Document Released: 05/21/2002 Document Revised: 07/26/2018 Document Reviewed: 06/11/2015 Elsevier Interactive Patient Education  2019 [employer].

## 2019-04-17 ENCOUNTER — Other Ambulatory Visit: Payer: Self-pay | Admitting: *Deleted

## 2019-04-17 DIAGNOSIS — M79604 Pain in right leg: Secondary | ICD-10-CM

## 2019-04-17 DIAGNOSIS — C8338 Diffuse large B-cell lymphoma, lymph nodes of multiple sites: Secondary | ICD-10-CM

## 2019-04-17 MED ORDER — GABAPENTIN 600 MG PO TABS: 600.0000 mg | ORAL_TABLET | Freq: Two times a day (BID) | ORAL | 1 refills | Status: AC

## 2019-04-20 ENCOUNTER — Encounter: Payer: Self-pay | Admitting: *Deleted

## 2019-05-01 NOTE — Progress Notes (Signed)
[place] OFFICE PROGRESS NOTE  Patient Care Team: Maury Dus, MD as PCP - General (Family Medicine)  HEME/ONC OVERVIEW: 1. 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, GCB subtype; poor prognosis by R-IPI; intermediate risk by CNS-IPI  -12/2018: CT showed cervical lymphadenopathy (left supraclavicular LN ~3 x 1.5cm), abnormal soft tissue masses surrounding bilateral kidneys (R 4.0 x 3.2cm; L 3 x 2.5cm) encasing the ureters, RP adenopathy (largest 3.4cm), and R internal iliac lymphadenopathy (conglomerate 4.5 x 2.5cm) -01/2019:   PET showed extensive FDG-avid adenopathy in the cervical, paratracheal, peri-aortic, mesenteric and iliac LN's, as well as extensive skeletal involvement throughout the axial and appendicular skeleton; expansile soft tissue lesions in the left scapula and right sacrum  L supraclavicular LN bx showed DLBCL, Ki-67 70-80%, GCB subtype; FISH positive for Bcl-2 rearrangement (41.5%) but negative for Bcl-6 and Myc rearrangement   Normal LVEF on TTE   MRI brain negative for any CNS involvement -Mid-01/2019 - present: R-CHOP with Udenyca, plan for 6 cycles   HD MTX added with Cycles 2, 4 and 6 of R-CHOP at [facility]   PET after 3 cycles of chemotherapy showed marked response   TREATMENT REGIMEN:  02/01/2019 - present: R-CHOP with Udenyca, plan for 6 cycles  IT MTX with Cycle 1; HD MTX with Cycles 2, 4 and 6 of chemotherapy at [place]:   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 Cassell Clement   Chemotherapy-associated  anemia -Secondary to chemotherapy -Hgb 10.7, stable -Patient denies any symptom of bleeding -We will monitor for now; no indication for dose adjustment -If anemia worsens in the future, we will consider delaying chemotherapy or adjusting chemotherapy dose  R lower extremity pain  -Overall improving with chemotherapy -Continue ROM exercises to improve mobility and ACE wraps for swelling -On gabapentin and PRN tramadol   Oral candidiasis -On maintenance fluconazole during chemotherapy due to recurrent oral candidiasis   No orders of the defined types were placed in this encounter.  All questions were answered. The patient knows to call the clinic with any problems, questions or concerns. No barriers to learning was detected.  Return in 3 weeks for labs, port flush, clinic appt and Cycle 6 of R-CHOP.  Paul Men, MD 05/03/2019 11:56 AM  CHIEF COMPLAINT: "I am doing fine"  INTERVAL HISTORY: Paul Mason returns to clinic for follow-up of diffuse large B-cell lymphoma R-CHOP.  Patient reports that he tolerated his second dose of high-dose methotrexate well at the [facility], and he was kept in the hospital for an extra day for additional IV fluid hydration.  He had some left shoulder pain, for which x-ray was done in the hospital and was reportedly normal.  He was prescribed muscle relaxant with improvement in the symptom.  He reports that he still has some right ankle tightness as well as somewhat limited range of motion, but both the tightness and range of motion are improving.  He denies any other complaint today.  SUMMARY OF ONCOLOGIC HISTORY:   Diffuse large B-cell lymphoma ([place])   12/31/2018 Imaging    CT neck: IMPRESSION: Enlarged  cervical lymph nodes in the neck as above. Most prominent nodes are in the left supraclavicular region. This may be due to metastatic disease or lymphoma. No primary head and neck cancer identified.    12/31/2018 Imaging    CTA chest: IMPRESSION: 1.  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 Abnormal soft tissue associated with both kidneys and surrounding the proximal left ureter is also likely due to lymphoma. This soft tissue associated with the kidneys and left ureter results in mild bilateral hydronephrosis, left greater than right. 2. Small pleural effusions, mild pulmonary edema, ascites, and fluid and stranding in the mesentery is all likely due to third spacing of fluid. The mesenteric findings surrounds the pancreas but is probably part of the broader process of fluid third-spacing. Recommend clinical correlation to exclude signs of pancreatitis. If there is any concern for pancreatitis, recommend obtaining a lipase. 3. Small pulmonary nodules as above. 4. No pulmonary emboli. 5. Coronary artery calcifications.    12/31/2018 Imaging    CT abdomen/pelvis: IMPRESSION: 1. 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 Abnormal soft tissue associated with both kidneys and surrounding the proximal left ureter is also likely due to lymphoma. This soft tissue associated with the kidneys and left ureter results in mild bilateral hydronephrosis, left greater than right. 2. Small pleural effusions, mild pulmonary edema, ascites, and fluid and stranding in the mesentery is all likely due to third spacing of fluid. The mesenteric findings surrounds the pancreas but is probably part of the broader process of fluid third-spacing. Recommend clinical correlation to exclude signs of pancreatitis. If there is any concern for pancreatitis, recommend obtaining a lipase. 3. Small pulmonary nodules as above. 4. No pulmonary emboli. 5. Coronary artery calcifications.    01/01/2019 Initial Diagnosis    Diffuse large B-cell lymphoma ([place])    01/12/2019 Procedure    Left supraclavicular LN biopsy by Dr. Dalbert Batman     01/12/2019 Pathology Results    Accession: [id number]  Tissue-Flow Cytometry - MONOCLONAL B-CELL POPULATION WITH EXPRESSION OF CD10 COMPRISES 80% OF ALL LYMPHOCYTES - SEE COMMENT    01/12/2019 Pathology Results    Accession: [id number]  Lymph node for lymphoma, Left supraclavicular - DIFFUSE LARGE B-CELL LYMPHOMA - SEE COMMENT Microscopic Comment The excisional biopsy material is composed diffuse lymphoid proliferation. The lymphocytes are medium to large with vesicular chromatin, inconspicuous to prominent nucleoli, scant cytoplasm and admixed small benign lymphocytes. The atypical cells are B-cells by CD20 immunohistochemistry with expression of CD10, bcl-6, and bcl-2. The proliferative rate is high by ki-67 (70-80%). The neoplastic cells do not express CD5 or EBV by in-situ hybridization. CD3 highlights background T-cells. Flow cytometry performed on the sample identified a kappa-restricted B-cell population with expression of CD10 that comprised 80% of all lymphocytes (See [id number]).    01/15/2019 Imaging    PET: IMPRESSION: 1. Extensive hypermetabolic adenopathy in the cervical lymph nodes, paratracheal nodes, periaortic nodes and iliac nodes. 2. Hypermetabolic nodal metastasis within the mesentery of the small bowel colon. 3. Extensive skeletal metastasis within the axillary and appendicular skeleton with intense metabolic activity. Expansile soft tissue lesions in the superior aspect of the LEFT scapula and RIGHT sacrum. These expansile soft tissue bone metastasis may be best targets for biopsy. 4. Normal spleen.     02/01/2019 -  Chemotherapy    The patient had DOXOrubicin (ADRIAMYCIN) chemo injection 100 mg, 49.5 mg/m2 = 102 mg,  Intravenous,  Once, 4 of 6 cycles Administration: 100 mg (02/01/2019), 100 mg (02/22/2019), 100 mg (03/22/2019), 100 mg (04/12/2019) palonosetron (ALOXI) injection 0.25 mg, 0.25 mg, Intravenous,  Once, 4 of 6 cycles Administration: 0.25 mg  (02/01/2019), 0.25 mg (02/22/2019), 0.25 mg (03/22/2019), 0.25 mg (04/12/2019) pegfilgrastim-cbqv (UDENYCA) injection 6 mg, 6 mg, Subcutaneous, Once, 4 of 6 cycles Administration: 6 mg (02/02/2019), 6 mg (02/23/2019), 6 mg (03/23/2019), 6 mg (04/13/2019) vinCRIStine (ONCOVIN) 2 mg in sodium chloride 0.9 % 50 mL chemo infusion, 2 mg, Intravenous,  Once, 4 of 6 cycles Administration: 2 mg (02/01/2019), 2 mg (02/22/2019), 2 mg (03/22/2019), 2 mg (04/12/2019) methotrexate (PF) 12 mg in sodium chloride (PF) 0.9 % INTRATHECAL chemo injection, , Intrathecal,  Once, 1 of 1 cycle Administration:  (02/05/2019) riTUXimab (RITUXAN) 800 mg in sodium chloride 0.9 % 250 mL (2.4242 mg/mL) infusion, 375 mg/m2 = 800 mg, Intravenous,  Once, 1 of 1 cycle Administration: 800 mg (02/01/2019) cyclophosphamide (CYTOXAN) 1,540 mg in sodium chloride 0.9 % 250 mL chemo infusion, 750 mg/m2 = 1,540 mg, Intravenous,  Once, 4 of 6 cycles Administration: 1,540 mg (02/01/2019), 1,540 mg (02/22/2019), 1,540 mg (03/22/2019), 1,540 mg (04/12/2019) riTUXimab (RITUXAN) 800 mg in sodium chloride 0.9 % 170 mL infusion, 375 mg/m2 = 800 mg (100 % of original dose 375 mg/m2), Intravenous,  Once, 3 of 5 cycles Dose modification: 375 mg/m2 (original dose 375 mg/m2, Cycle 2, Reason: Provider Judgment, Comment: Changing to rapid infusion, tolerated 1st dose well) Administration: 800 mg (02/22/2019), 800 mg (03/22/2019), 800 mg (04/12/2019)  for chemotherapy treatment.     03/29/2019 Imaging    PET (after 3 cycles of R-CHOP): IMPRESSION: 1. Marked response to therapy of lymphoma, including significant improvement and resolution of nodal disease within the neck, chest, abdomen, and pelvis. ([place] 3). 2. The dominant left scapular and right sacral lesions are significantly improved/resolved. There is increased heterogeneous marrow activity throughout, favored to be due to stimulation by chemotherapy. 3. Age advanced coronary artery atherosclerosis.  Recommend assessment of coronary risk factors and consideration of medical therapy. 4.  Aortic Atherosclerosis (ICD10-I70.0).     REVIEW OF SYSTEMS:   Constitutional: ( - ) fevers, ( - )  chills , ( - ) night sweats Eyes: ( - ) blurriness of vision, ( - ) double vision, ( - ) watery eyes Ears, nose, mouth, throat, and face: ( - ) mucositis, ( - ) sore throat Respiratory: ( - ) cough, ( - ) dyspnea, ( - ) wheezes Cardiovascular: ( - ) palpitation, ( - ) chest discomfort, ( - ) lower extremity swelling Gastrointestinal:  ( - ) nausea, ( - ) heartburn, ( - ) change in bowel habits Skin: ( - ) abnormal skin rashes Lymphatics: ( - ) new lymphadenopathy, ( - ) easy bruising Neurological: ( - ) numbness, ( - ) tingling, ( - ) new weaknesses Behavioral/Psych: ( - ) mood change, ( - ) new changes  All other systems were reviewed with the patient and are negative.  I have reviewed the past medical history, past surgical history, social history and family history with the patient and they are unchanged from previous note.  ALLERGIES:  is allergic to penicillins.  MEDICATIONS:  Current Outpatient Medications  Medication Sig Dispense Refill  . acetaminophen (TYLENOL) 325 MG tablet Take 975 mg by mouth every 6 (six) hours as needed for mild pain.    Marland Kitchen acyclovir (ZOVIRAX) 400 MG tablet Take 1 tablet (400 mg total) by mouth 2 (two)  times daily. 180 tablet 3  . allopurinol (ZYLOPRIM) 100 MG tablet Take 1 tablet (100 mg total) by mouth daily for 30 days. 30 tablet 5  . docusate sodium (COLACE) 100 MG capsule Take 100 mg by mouth 2 (two) times daily.    . ferrous sulfate 325 (65 FE) MG EC tablet Take 325 mg by mouth daily.     Marland Kitchen gabapentin (NEURONTIN) 600 MG tablet Take 1 tablet (600 mg total) by mouth 2 (two) times daily. 180 tablet 1  . lidocaine-prilocaine (EMLA) cream Apply to affected area once 30 g 3  . LORazepam (ATIVAN) 0.5 MG tablet Take 1 tablet (0.5 mg total) by mouth every 6 (six) hours as  needed (Nausea or vomiting). 30 tablet 0  . magic mouthwash w/lidocaine SOLN Take 5 mLs by mouth 4 (four) times daily as needed for mouth pain. 200 mL 5  . ondansetron (ZOFRAN) 8 MG tablet Take 1 tablet (8 mg total) by mouth 2 (two) times daily as needed for refractory nausea / vomiting. Start on day 3 after cyclophosphamide chemotherapy. 30 tablet 1  . prochlorperazine (COMPAZINE) 10 MG tablet Take 1 tablet (10 mg total) by mouth every 6 (six) hours as needed (Nausea or vomiting). 30 tablet 6  . traMADol (ULTRAM) 50 MG tablet Take 1 tablet (50 mg total) by mouth every 8 (eight) hours as needed. 90 tablet 0   No current facility-administered medications for this visit.     PHYSICAL EXAMINATION: ECOG PERFORMANCE STATUS: 1 - Symptomatic but completely ambulatory  There were no vitals filed for this visit. There is no height or weight on file to calculate BMI.  There were no vitals filed for this visit.  GENERAL: alert, no distress and comfortable SKIN: skin color, texture, turgor are normal, no rashes or significant lesions EYES: conjunctiva are pink and non-injected, sclera clear OROPHARYNX: no exudate, no erythema; lips, buccal mucosa, and tongue normal  NECK: supple, non-tender LUNGS: clear to auscultation with normal breathing effort HEART: regular rate & rhythm and no murmurs and no lower extremity edema ABDOMEN: soft, non-tender, non-distended, normal bowel sounds Musculoskeletal: no cyanosis of digits and no clubbing  PSYCH: alert & oriented x 3, fluent speech NEURO: no focal motor/sensory deficits  LABORATORY DATA:  I have reviewed the data as listed    Component Value Date/Time   NA 138 05/03/2019 1100   K 4.4 05/03/2019 1100   CL 104 05/03/2019 1100   CO2 26 05/03/2019 1100   GLUCOSE 189 (H) 05/03/2019 1100   BUN 30 (H) 05/03/2019 1100   CREATININE 1.20 05/03/2019 1100   CALCIUM 8.9 05/03/2019 1100   PROT 6.3 (L) 05/03/2019 1100   ALBUMIN 4.5 05/03/2019 1100   AST 13  (L) 05/03/2019 1100   ALT 32 05/03/2019 1100   ALKPHOS 64 05/03/2019 1100   BILITOT 0.3 05/03/2019 1100   GFRNONAA >60 05/03/2019 1100   GFRAA >60 05/03/2019 1100    No results found for: SPEP, UPEP  Lab Results  Component Value Date   WBC 6.1 05/03/2019   NEUTROABS 5.7 05/03/2019   HGB 10.7 (L) 05/03/2019   HCT 32.7 (L) 05/03/2019   MCV 89.6 05/03/2019   PLT 152 05/03/2019      Chemistry      Component Value Date/Time   NA 138 05/03/2019 1100   K 4.4 05/03/2019 1100   CL 104 05/03/2019 1100   CO2 26 05/03/2019 1100   BUN 30 (H) 05/03/2019 1100   CREATININE 1.20 05/03/2019 1100  Component Value Date/Time   CALCIUM 8.9 05/03/2019 1100   ALKPHOS 64 05/03/2019 1100   AST 13 (L) 05/03/2019 1100   ALT 32 05/03/2019 1100   BILITOT 0.3 05/03/2019 1100

## 2019-05-03 ENCOUNTER — Inpatient Hospital Stay: Payer: BC Managed Care – PPO

## 2019-05-03 ENCOUNTER — Encounter: Payer: Self-pay | Admitting: Hematology

## 2019-05-03 ENCOUNTER — Other Ambulatory Visit: Payer: Self-pay

## 2019-05-03 ENCOUNTER — Telehealth: Payer: Self-pay | Admitting: Hematology

## 2019-05-03 ENCOUNTER — Inpatient Hospital Stay (HOSPITAL_BASED_OUTPATIENT_CLINIC_OR_DEPARTMENT_OTHER): Payer: BC Managed Care – PPO | Admitting: Hematology

## 2019-05-03 VITALS — BP 128/80 | HR 95 | Resp 19 | Wt 197.0 lb

## 2019-05-03 VITALS — BP 119/89 | HR 88 | Temp 98.2°F | Resp 20

## 2019-05-03 DIAGNOSIS — M79604 Pain in right leg: Secondary | ICD-10-CM

## 2019-05-03 DIAGNOSIS — D6481 Anemia due to antineoplastic chemotherapy: Secondary | ICD-10-CM | POA: Diagnosis not present

## 2019-05-03 DIAGNOSIS — T451X5A Adverse effect of antineoplastic and immunosuppressive drugs, initial encounter: Secondary | ICD-10-CM

## 2019-05-03 DIAGNOSIS — B370 Candidal stomatitis: Secondary | ICD-10-CM | POA: Diagnosis not present

## 2019-05-03 DIAGNOSIS — C8338 Diffuse large B-cell lymphoma, lymph nodes of multiple sites: Secondary | ICD-10-CM

## 2019-05-03 DIAGNOSIS — Z5112 Encounter for antineoplastic immunotherapy: Secondary | ICD-10-CM | POA: Diagnosis not present

## 2019-05-03 LAB — CBC WITH DIFFERENTIAL (CANCER CENTER ONLY)
Abs Immature Granulocytes: 0.05 10*3/uL (ref 0.00–0.07)
Basophils Absolute: 0 10*3/uL (ref 0.0–0.1)
Basophils Relative: 0 %
Eosinophils Absolute: 0 10*3/uL (ref 0.0–0.5)
Eosinophils Relative: 0 %
HCT: 32.7 % — ABNORMAL LOW (ref 39.0–52.0)
Hemoglobin: 10.7 g/dL — ABNORMAL LOW (ref 13.0–17.0)
Immature Granulocytes: 1 %
Lymphocytes Relative: 4 %
Lymphs Abs: 0.3 10*3/uL — ABNORMAL LOW (ref 0.7–4.0)
MCH: 29.3 pg (ref 26.0–34.0)
MCHC: 32.7 g/dL (ref 30.0–36.0)
MCV: 89.6 fL (ref 80.0–100.0)
Monocytes Absolute: 0.1 10*3/uL (ref 0.1–1.0)
Monocytes Relative: 1 %
Neutro Abs: 5.7 10*3/uL (ref 1.7–7.7)
Neutrophils Relative %: 94 %
Platelet Count: 152 10*3/uL (ref 150–400)
RBC: 3.65 MIL/uL — ABNORMAL LOW (ref 4.22–5.81)
RDW: 16 % — ABNORMAL HIGH (ref 11.5–15.5)
WBC Count: 6.1 10*3/uL (ref 4.0–10.5)
nRBC: 0 % (ref 0.0–0.2)

## 2019-05-03 LAB — CMP (CANCER CENTER ONLY)
ALT: 32 U/L (ref 0–44)
AST: 13 U/L — ABNORMAL LOW (ref 15–41)
Albumin: 4.5 g/dL (ref 3.5–5.0)
Alkaline Phosphatase: 64 U/L (ref 38–126)
Anion gap: 8 (ref 5–15)
BUN: 30 mg/dL — ABNORMAL HIGH (ref 6–20)
CO2: 26 mmol/L (ref 22–32)
Calcium: 8.9 mg/dL (ref 8.9–10.3)
Chloride: 104 mmol/L (ref 98–111)
Creatinine: 1.2 mg/dL (ref 0.61–1.24)
GFR, Est AFR Am: >60 mL/min (ref >60)
GFR, Estimated: >60 mL/min (ref >60)
Glucose, Bld: 189 mg/dL — ABNORMAL HIGH (ref 70–99)
Potassium: 4.4 mmol/L (ref 3.5–5.1)
Sodium: 138 mmol/L (ref 135–145)
Total Bilirubin: 0.3 mg/dL (ref 0.3–1.2)
Total Protein: 6.3 g/dL — ABNORMAL LOW (ref 6.5–8.1)

## 2019-05-03 MED ORDER — VINCRISTINE SULFATE CHEMO INJECTION 1 MG/ML
2.0000 mg | Freq: Once | INTRAVENOUS | Status: AC
  Administered 2019-05-03: 2 mg via INTRAVENOUS
  Filled 2019-05-03: qty 2

## 2019-05-03 MED ORDER — SODIUM CHLORIDE 0.9% FLUSH
10.0000 mL | INTRAVENOUS | Status: DC | PRN
  Administered 2019-05-03: 10 mL
  Filled 2019-05-03: qty 10

## 2019-05-03 MED ORDER — PALONOSETRON HCL INJECTION 0.25 MG/5ML
0.2500 mg | Freq: Once | INTRAVENOUS | Status: AC
  Administered 2019-05-03: 0.25 mg via INTRAVENOUS

## 2019-05-03 MED ORDER — SODIUM CHLORIDE 0.9 % IV SOLN
375.0000 mg/m2 | Freq: Once | INTRAVENOUS | Status: AC
  Administered 2019-05-03: 15:00:00 800 mg via INTRAVENOUS
  Filled 2019-05-03: qty 50

## 2019-05-03 MED ORDER — SODIUM CHLORIDE 0.9 % IV SOLN
750.0000 mg/m2 | Freq: Once | INTRAVENOUS | Status: AC
  Administered 2019-05-03: 1540 mg via INTRAVENOUS
  Filled 2019-05-03: qty 77

## 2019-05-03 MED ORDER — DIPHENHYDRAMINE HCL 25 MG PO CAPS
50.0000 mg | ORAL_CAPSULE | Freq: Once | ORAL | Status: AC
  Administered 2019-05-03: 50 mg via ORAL

## 2019-05-03 MED ORDER — ACETAMINOPHEN 325 MG PO TABS
650.0000 mg | ORAL_TABLET | Freq: Once | ORAL | Status: AC
  Administered 2019-05-03: 650 mg via ORAL

## 2019-05-03 MED ORDER — HEPARIN SOD (PORK) LOCK FLUSH 100 UNIT/ML IV SOLN
500.0000 [IU] | Freq: Once | INTRAVENOUS | Status: AC | PRN
  Administered 2019-05-03: 500 [IU]
  Filled 2019-05-03: qty 5

## 2019-05-03 MED ORDER — SODIUM CHLORIDE 0.9 % IV SOLN
Freq: Once | INTRAVENOUS | Status: AC
  Administered 2019-05-03: 13:00:00 via INTRAVENOUS
  Filled 2019-05-03: qty 250

## 2019-05-03 MED ORDER — DOXORUBICIN HCL CHEMO IV INJECTION 2 MG/ML
49.0000 mg/m2 | Freq: Once | INTRAVENOUS | Status: AC
  Administered 2019-05-03: 100 mg via INTRAVENOUS
  Filled 2019-05-03: qty 50

## 2019-05-03 MED ORDER — DEXAMETHASONE SODIUM PHOSPHATE 10 MG/ML IJ SOLN
10.0000 mg | Freq: Once | INTRAMUSCULAR | Status: AC
  Administered 2019-05-03: 10 mg via INTRAVENOUS

## 2019-05-03 NOTE — Patient Instructions (Signed)
[facility] Discharge Instructions for Patients Receiving Chemotherapy  Today you received the following chemotherapy agents:  Adriamycin, Vincristine, Cytoxan and Rituxan  To help prevent nausea and vomiting after your treatment, we encourage you to take your nausea medication as ordered per MD.    If you develop nausea and vomiting that is not controlled by your nausea medication, call the clinic.   BELOW ARE SYMPTOMS THAT SHOULD BE REPORTED IMMEDIATELY:  *FEVER GREATER THAN 100.5 F  *CHILLS WITH OR WITHOUT FEVER  NAUSEA AND VOMITING THAT IS NOT CONTROLLED WITH YOUR NAUSEA MEDICATION  *UNUSUAL SHORTNESS OF BREATH  *UNUSUAL BRUISING OR BLEEDING  TENDERNESS IN MOUTH AND THROAT WITH OR WITHOUT PRESENCE OF ULCERS  *URINARY PROBLEMS  *BOWEL PROBLEMS  UNUSUAL RASH Items with * indicate a potential emergency and should be followed up as soon as possible.  Feel free to call the clinic should you have any questions or concerns. The clinic phone number is [phone].  Please show the CHEMO ALERT CARD at check-in to the Emergency Department and triage nurse.   

## 2019-05-03 NOTE — Telephone Encounter (Signed)
No change in appts per 5/21 los

## 2019-05-04 ENCOUNTER — Inpatient Hospital Stay: Payer: BC Managed Care – PPO

## 2019-05-04 ENCOUNTER — Other Ambulatory Visit: Payer: Self-pay

## 2019-05-04 VITALS — BP 141/79 | HR 83 | Temp 98.1°F | Resp 19

## 2019-05-04 DIAGNOSIS — C8338 Diffuse large B-cell lymphoma, lymph nodes of multiple sites: Secondary | ICD-10-CM

## 2019-05-04 DIAGNOSIS — Z5112 Encounter for antineoplastic immunotherapy: Secondary | ICD-10-CM | POA: Diagnosis not present

## 2019-05-04 MED ORDER — PEGFILGRASTIM-CBQV 6 MG/0.6ML ~~LOC~~ SOSY
6.0000 mg | PREFILLED_SYRINGE | Freq: Once | SUBCUTANEOUS | Status: AC
  Administered 2019-05-04: 6 mg via SUBCUTANEOUS

## 2019-05-04 MED ORDER — PEGFILGRASTIM-CBQV 6 MG/0.6ML ~~LOC~~ SOSY
PREFILLED_SYRINGE | SUBCUTANEOUS | Status: AC
  Filled 2019-05-04: qty 0.6

## 2019-05-04 NOTE — Patient Instructions (Signed)
Pegfilgrastim injection  What is this medicine?  PEGFILGRASTIM (PEG fil gra stim) is a long-acting granulocyte colony-stimulating factor that stimulates the growth of neutrophils, a type of white blood cell important in the body's fight against infection. It is used to reduce the incidence of fever and infection in patients with certain types of cancer who are receiving chemotherapy that affects the bone marrow, and to increase survival after being exposed to high doses of radiation.  This medicine may be used for other purposes; ask your health care provider or pharmacist if you have questions.  COMMON BRAND NAME(S): Fulphila, Neulasta, UDENYCA  What should I tell my health care provider before I take this medicine?  They need to know if you have any of these conditions:  -kidney disease  -latex allergy  -ongoing radiation therapy  -sickle cell disease  -skin reactions to acrylic adhesives (On-Body Injector only)  -an unusual or allergic reaction to pegfilgrastim, filgrastim, other medicines, foods, dyes, or preservatives  -pregnant or trying to get pregnant  -breast-feeding  How should I use this medicine?  This medicine is for injection under the skin. If you get this medicine at home, you will be taught how to prepare and give the pre-filled syringe or how to use the On-body Injector. Refer to the patient Instructions for Use for detailed instructions. Use exactly as directed. Tell your healthcare provider immediately if you suspect that the On-body Injector may not have performed as intended or if you suspect the use of the On-body Injector resulted in a missed or partial dose.  It is important that you put your used needles and syringes in a special sharps container. Do not put them in a trash can. If you do not have a sharps container, call your pharmacist or healthcare provider to get one.  Talk to your pediatrician regarding the use of this medicine in children. While this drug may be prescribed for  selected conditions, precautions do apply.  Overdosage: If you think you have taken too much of this medicine contact a poison control center or emergency room at once.  NOTE: This medicine is only for you. Do not share this medicine with others.  What if I miss a dose?  It is important not to miss your dose. Call your doctor or health care professional if you miss your dose. If you miss a dose due to an On-body Injector failure or leakage, a new dose should be administered as soon as possible using a single prefilled syringe for manual use.  What may interact with this medicine?  Interactions have not been studied.  Give your health care provider a list of all the medicines, herbs, non-prescription drugs, or dietary supplements you use. Also tell them if you smoke, drink alcohol, or use illegal drugs. Some items may interact with your medicine.  This list may not describe all possible interactions. Give your health care provider a list of all the medicines, herbs, non-prescription drugs, or dietary supplements you use. Also tell them if you smoke, drink alcohol, or use illegal drugs. Some items may interact with your medicine.  What should I watch for while using this medicine?  You may need blood work done while you are taking this medicine.  If you are going to need a MRI, CT scan, or other procedure, tell your doctor that you are using this medicine (On-Body Injector only).  What side effects may I notice from receiving this medicine?  Side effects that you should report to   your doctor or health care professional as soon as possible:  -allergic reactions like skin rash, itching or hives, swelling of the face, lips, or tongue  -back pain  -dizziness  -fever  -pain, redness, or irritation at site where injected  -pinpoint red spots on the skin  -red or dark-brown urine  -shortness of breath or breathing problems  -stomach or side pain, or pain at the shoulder  -swelling  -tiredness  -trouble passing urine or  change in the amount of urine  Side effects that usually do not require medical attention (report to your doctor or health care professional if they continue or are bothersome):  -bone pain  -muscle pain  This list may not describe all possible side effects. Call your doctor for medical advice about side effects. You may report side effects to FDA at 1-800-FDA-1088.  Where should I keep my medicine?  Keep out of the reach of children.  If you are using this medicine at home, you will be instructed on how to store it. Throw away any unused medicine after the expiration date on the label.  NOTE: This sheet is a summary. It may not cover all possible information. If you have questions about this medicine, talk to your doctor, pharmacist, or health care provider.   2019 Elsevier/Gold Standard (2018-03-06 16:57:08)

## 2019-05-23 NOTE — Progress Notes (Signed)
[place] OFFICE PROGRESS NOTE  Patient Care Team: Maury Dus, MD as PCP - General (Family Medicine)  HEME/ONC OVERVIEW: 1. 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, GCB subtype; poor prognosis by R-IPI; intermediate risk by CNS-IPI  -12/2018: CT showed cervical lymphadenopathy (left supraclavicular LN ~3 x 1.5cm), abnormal soft tissue masses surrounding bilateral kidneys (R 4.0 x 3.2cm; L 3 x 2.5cm) encasing the ureters, RP adenopathy (largest 3.4cm), and R internal iliac lymphadenopathy (conglomerate 4.5 x 2.5cm) -01/2019:   PET showed extensive FDG-avid adenopathy in the cervical, paratracheal, peri-aortic, mesenteric and iliac LN's, as well as extensive skeletal involvement throughout the axial and appendicular skeleton; expansile soft tissue lesions in the left scapula and right sacrum  L supraclavicular LN bx showed DLBCL, Ki-67 70-80%, GCB subtype; FISH positive for Bcl-2 rearrangement (41.5%) but negative for Bcl-6 and Myc rearrangement   Normal LVEF on TTE   MRI brain negative for any CNS involvement -Mid-01/2019 - present: R-CHOP with Udenyca, plan for 6 cycles   HD MTX added with Cycles 2, 4 and 6 of R-CHOP at [facility]   PET after 3 cycles of chemotherapy showed marked response   TREATMENT REGIMEN:  02/01/2019 - present: R-CHOP with Udenyca, plan for 6 cycles  IT MTX with Cycle 1; HD MTX with Cycles 2, 4 and 6 of chemotherapy at [place]:   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; GCB subtype  -S/p 5 cycles of R-CHOP and 2 cycles of HD-MTX  -Labs adequate today, proceed with Cycle 6 (and the last cycle) of R-CHOP -Patient will have one more cycle of HD-MTX for CNS ppx (currently scheduled from 6/24-6/28) at [facility] after this last cycle of systemic chemotherapy -We will plan to obtain PET in 3-4 weeks after completion of all chemotherapy to assess end-of-treatment response -Given that he had very  bulky disease on initial presentation, including large scapular soft tissue masses, I believe ISRT should be considered for consolidation treatment -Therefore, I have referred the patient to radiation oncology for consideration of ISRT, pending the end-of-treatment PET results  -Prophylaxis: acyclovir, allopurinol  CNS prophylaxis -On HD MTX with Cycles 2, 4 and 6 of R-CHOP at [facility] under the guidance of Dr. Cassell Clement   Chemotherapy-associated anemia -Secondary to chemotherapy -Hgb 10.5, stable -Patient denies any symptom of bleeding -We will monitor for now; no indication for dose adjustment -If anemia worsens in the future, we will consider delaying chemotherapy or adjusting chemotherapy dose  Leukocytosis -Likely secondary to G-CSF -WBC 11.6 today, slightly higher than last visit -Patient denies any symptoms of infection -We will monitor it for now   R lower extremity pain  -Overall improving -Continue ROM exercises and ACE wraps for swelling  -On gabapentin and PRN tramadol  Oral candidiasis -On suppressive fluconazole due to recurrent oral candidiasis -Continue fluconazole for now -Once he completes the HD-MTX, okay to stop fluconazole in 1-2 weeks afterwards  Orders Placed This Encounter  Procedures  . Ambulatory referral to Radiation Oncology    Referral Priority:   Routine    Referral Type:   Consultation    Referral Reason:   Specialty Services Required    Referred to Provider:   Kyung Rudd, MD    Requested Specialty:   Radiation Oncology    Number of Visits Requested:   1   All questions were answered. The patient knows to call the clinic with any problems, questions or concerns. No barriers to learning  was detected.  Return on 06/21/2019 for labs, port flush and clinic appt.   Tish Men, MD 05/24/2019 10:06 AM  CHIEF COMPLAINT: "I am doing okay"  INTERVAL HISTORY: Mr. Philbrook returns to clinic for follow-up of DLBCL on R-CHOP.  Patient reports that he has  some intermittent left shoulder pain, occasionally radiating to the left and with some tingling sensation, occurring usually in the morning when he first wakes up.  He takes Flexeril as needed, which seems to help, but he has been taking this fairly due to it making him sleepy.  He also takes occasional tramadol with pain relief.  He still has some right ankle stiffness, which is overall improving.  He is scheduled for the third and final cycle of high-dose methotrexate at [facility] from 6/24 to 6/28.  He denies any other complaint today.  SUMMARY OF ONCOLOGIC HISTORY: Oncology History  Diffuse large B-cell lymphoma ([place])  12/31/2018 Imaging   CT neck: IMPRESSION: Enlarged cervical lymph nodes in the neck as above. Most prominent nodes are in the left supraclavicular region. This may be due to metastatic disease or lymphoma. No primary head and neck cancer identified.   12/31/2018 Imaging   CTA chest: IMPRESSION: 1. 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 Abnormal soft tissue associated with both kidneys and surrounding the proximal left ureter is also likely due to lymphoma. This soft tissue associated with the kidneys and left ureter results in mild bilateral hydronephrosis, left greater than right. 2. Small pleural effusions, mild pulmonary edema, ascites, and fluid and stranding in the mesentery is all likely due to third spacing of fluid. The mesenteric findings surrounds the pancreas but is probably part of the broader process of fluid third-spacing. Recommend clinical correlation to exclude signs of pancreatitis. If there is any concern for pancreatitis, recommend obtaining a lipase. 3. Small pulmonary nodules as above. 4. No pulmonary emboli. 5. Coronary artery calcifications.   12/31/2018 Imaging   CT abdomen/pelvis: IMPRESSION: 1. The constellation of findings is consistent with lymphoma with adenopathy  in the chest, abdomen, and pelvis. Abnormal soft tissue associated with both kidneys and surrounding the proximal left ureter is also likely due to lymphoma. This soft tissue associated with the kidneys and left ureter results in mild bilateral hydronephrosis, left greater than right. 2. Small pleural effusions, mild pulmonary edema, ascites, and fluid and stranding in the mesentery is all likely due to third spacing of fluid. The mesenteric findings surrounds the pancreas but is probably part of the broader process of fluid third-spacing. Recommend clinical correlation to exclude signs of pancreatitis. If there is any concern for pancreatitis, recommend obtaining a lipase. 3. Small pulmonary nodules as above. 4. No pulmonary emboli. 5. Coronary artery calcifications.   01/01/2019 Initial Diagnosis   Diffuse large B-cell lymphoma ([place])   01/12/2019 Procedure   Left supraclavicular LN biopsy by Dr. Dalbert Batman   01/12/2019 Pathology Results   Accession: [id number]  Tissue-Flow Cytometry - MONOCLONAL B-CELL POPULATION WITH EXPRESSION OF CD10 COMPRISES 80% OF ALL LYMPHOCYTES - SEE COMMENT   01/12/2019 Pathology Results   Accession: [id number]  Lymph node for lymphoma, Left supraclavicular - DIFFUSE LARGE B-CELL LYMPHOMA - SEE COMMENT Microscopic Comment The excisional biopsy material is composed diffuse lymphoid proliferation. The lymphocytes are medium to large with vesicular chromatin, inconspicuous to prominent nucleoli, scant cytoplasm and admixed small benign lymphocytes. The atypical cells are B-cells by CD20 immunohistochemistry with expression of CD10, bcl-6, and bcl-2.  The proliferative rate is high by ki-67 (70-80%). The neoplastic cells do not express CD5 or EBV by in-situ hybridization. CD3 highlights background T-cells. Flow cytometry performed on the sample identified a kappa-restricted B-cell population with expression of CD10 that comprised 80% of all lymphocytes (See  [id number]).   01/15/2019 Imaging   PET: IMPRESSION: 1. Extensive hypermetabolic adenopathy in the cervical lymph nodes, paratracheal nodes, periaortic nodes and iliac nodes. 2. Hypermetabolic nodal metastasis within the mesentery of the small bowel colon. 3. Extensive skeletal metastasis within the axillary and appendicular skeleton with intense metabolic activity. Expansile soft tissue lesions in the superior aspect of the LEFT scapula and RIGHT sacrum. These expansile soft tissue bone metastasis may be best targets for biopsy. 4. Normal spleen.    02/01/2019 -  Chemotherapy   The patient had DOXOrubicin (ADRIAMYCIN) chemo injection 100 mg, 49.5 mg/m2 = 102 mg, Intravenous,  Once, 5 of 6 cycles Administration: 100 mg (02/01/2019), 100 mg (02/22/2019), 100 mg (03/22/2019), 100 mg (04/12/2019), 100 mg (05/03/2019) palonosetron (ALOXI) injection 0.25 mg, 0.25 mg, Intravenous,  Once, 5 of 6 cycles Administration: 0.25 mg (02/01/2019), 0.25 mg (02/22/2019), 0.25 mg (03/22/2019), 0.25 mg (04/12/2019), 0.25 mg (05/03/2019) pegfilgrastim-cbqv (UDENYCA) injection 6 mg, 6 mg, Subcutaneous, Once, 5 of 6 cycles Administration: 6 mg (02/02/2019), 6 mg (02/23/2019), 6 mg (03/23/2019), 6 mg (04/13/2019), 6 mg (05/04/2019) vinCRIStine (ONCOVIN) 2 mg in sodium chloride 0.9 % 50 mL chemo infusion, 2 mg, Intravenous,  Once, 5 of 6 cycles Administration: 2 mg (02/01/2019), 2 mg (02/22/2019), 2 mg (03/22/2019), 2 mg (04/12/2019), 2 mg (05/03/2019) methotrexate (PF) 12 mg in sodium chloride (PF) 0.9 % INTRATHECAL chemo injection, , Intrathecal,  Once, 1 of 1 cycle Administration:  (02/05/2019) riTUXimab (RITUXAN) 800 mg in sodium chloride 0.9 % 250 mL (2.4242 mg/mL) infusion, 375 mg/m2 = 800 mg, Intravenous,  Once, 1 of 1 cycle Administration: 800 mg (02/01/2019) cyclophosphamide (CYTOXAN) 1,540 mg in sodium chloride 0.9 % 250 mL chemo infusion, 750 mg/m2 = 1,540 mg, Intravenous,  Once, 5 of 6 cycles Administration: 1,540 mg  (02/01/2019), 1,540 mg (02/22/2019), 1,540 mg (03/22/2019), 1,540 mg (04/12/2019), 1,540 mg (05/03/2019) riTUXimab (RITUXAN) 800 mg in sodium chloride 0.9 % 170 mL infusion, 375 mg/m2 = 800 mg (100 % of original dose 375 mg/m2), Intravenous,  Once, 4 of 5 cycles Dose modification: 375 mg/m2 (original dose 375 mg/m2, Cycle 2, Reason: Provider Judgment, Comment: Changing to rapid infusion, tolerated 1st dose well) Administration: 800 mg (02/22/2019), 800 mg (03/22/2019), 800 mg (04/12/2019), 800 mg (05/03/2019)  for chemotherapy treatment.    03/29/2019 Imaging   PET (after 3 cycles of R-CHOP): IMPRESSION: 1. Marked response to therapy of lymphoma, including significant improvement and resolution of nodal disease within the neck, chest, abdomen, and pelvis. ([place] 3). 2. The dominant left scapular and right sacral lesions are significantly improved/resolved. There is increased heterogeneous marrow activity throughout, favored to be due to stimulation by chemotherapy. 3. Age advanced coronary artery atherosclerosis. Recommend assessment of coronary risk factors and consideration of medical therapy. 4.  Aortic Atherosclerosis (ICD10-I70.0).     REVIEW OF SYSTEMS:   Constitutional: ( - ) fevers, ( - )  chills , ( - ) night sweats Eyes: ( - ) blurriness of vision, ( - ) double vision, ( - ) watery eyes Ears, nose, mouth, throat, and face: ( - ) mucositis, ( - ) sore throat Respiratory: ( - ) cough, ( - ) dyspnea, ( - ) wheezes Cardiovascular: ( - ) palpitation, ( - )  chest discomfort, ( - ) lower extremity swelling Gastrointestinal:  ( - ) nausea, ( - ) heartburn, ( - ) change in bowel habits Skin: ( - ) abnormal skin rashes Lymphatics: ( - ) new lymphadenopathy, ( - ) easy bruising Neurological: ( - ) numbness, ( + ) tingling, ( - ) new weaknesses Behavioral/Psych: ( - ) mood change, ( - ) new changes  All other systems were reviewed with the patient and are negative.  I have reviewed the past  medical history, past surgical history, social history and family history with the patient and they are unchanged from previous note.  ALLERGIES:  is allergic to penicillins.  MEDICATIONS:  Current Outpatient Medications  Medication Sig Dispense Refill  . acetaminophen (TYLENOL) 325 MG tablet Take 975 mg by mouth every 6 (six) hours as needed for mild pain.    Marland Kitchen docusate sodium (COLACE) 100 MG capsule Take 100 mg by mouth 2 (two) times daily.    . ferrous sulfate 325 (65 FE) MG EC tablet Take 325 mg by mouth daily.     Marland Kitchen gabapentin (NEURONTIN) 600 MG tablet Take 1 tablet (600 mg total) by mouth 2 (two) times daily. 180 tablet 1  . lidocaine-prilocaine (EMLA) cream Apply to affected area once 30 g 3  . LORazepam (ATIVAN) 0.5 MG tablet Take 1 tablet (0.5 mg total) by mouth every 6 (six) hours as needed (Nausea or vomiting). 30 tablet 0  . magic mouthwash w/lidocaine SOLN Take 5 mLs by mouth 4 (four) times daily as needed for mouth pain. 200 mL 5  . ondansetron (ZOFRAN) 8 MG tablet Take 1 tablet (8 mg total) by mouth 2 (two) times daily as needed for refractory nausea / vomiting. Start on day 3 after cyclophosphamide chemotherapy. 30 tablet 1  . prochlorperazine (COMPAZINE) 10 MG tablet Take 1 tablet (10 mg total) by mouth every 6 (six) hours as needed (Nausea or vomiting). 30 tablet 6  . traMADol (ULTRAM) 50 MG tablet Take 1 tablet (50 mg total) by mouth every 8 (eight) hours as needed. 90 tablet 0  . allopurinol (ZYLOPRIM) 100 MG tablet Take 1 tablet (100 mg total) by mouth daily for 30 days. 30 tablet 5   No current facility-administered medications for this visit.     PHYSICAL EXAMINATION: ECOG PERFORMANCE STATUS: 1 - Symptomatic but completely ambulatory  Today's Vitals   05/24/19 0956  BP: 134/74  Pulse: 89  Resp: 18  Temp: 98.1 F (36.7 C)  TempSrc: Oral  SpO2: 99%  PainSc: 0-No pain   There is no height or weight on file to calculate BMI.  There were no vitals filed for this  visit.  GENERAL: alert, no distress and comfortable SKIN: skin color, texture, turgor are normal, no rashes or significant lesions EYES: conjunctiva are pink and non-injected, sclera clear OROPHARYNX: no exudate, no erythema; lips, buccal mucosa, and tongue normal  NECK: supple, non-tender LYMPH:  no palpable lymphadenopathy in the cervical LUNGS: clear to auscultation with normal breathing effort HEART: regular rate & rhythm and no murmurs and no lower extremity edema ABDOMEN: soft, non-tender, non-distended, normal bowel sounds Musculoskeletal: no cyanosis of digits and no clubbing  PSYCH: alert & oriented x 3, fluent speech NEURO: no focal motor/sensory deficits  LABORATORY DATA:  I have reviewed the data as listed    Component Value Date/Time   NA 141 05/24/2019 0930   K 3.7 05/24/2019 0930   CL 103 05/24/2019 0930   CO2 27 05/24/2019 0930  GLUCOSE 271 (H) 05/24/2019 0930   BUN 25 (H) 05/24/2019 0930   CREATININE 0.98 05/24/2019 0930   CALCIUM 8.8 (L) 05/24/2019 0930   PROT 6.2 (L) 05/24/2019 0930   ALBUMIN 4.2 05/24/2019 0930   AST 14 (L) 05/24/2019 0930   ALT 23 05/24/2019 0930   ALKPHOS 58 05/24/2019 0930   BILITOT 0.3 05/24/2019 0930   GFRNONAA >60 05/24/2019 0930   GFRAA >60 05/24/2019 0930    No results found for: SPEP, UPEP  Lab Results  Component Value Date   WBC 11.6 (H) 05/24/2019   NEUTROABS 10.6 (H) 05/24/2019   HGB 10.5 (L) 05/24/2019   HCT 32.8 (L) 05/24/2019   MCV 94.0 05/24/2019   PLT 199 05/24/2019      Chemistry      Component Value Date/Time   NA 141 05/24/2019 0930   K 3.7 05/24/2019 0930   CL 103 05/24/2019 0930   CO2 27 05/24/2019 0930   BUN 25 (H) 05/24/2019 0930   CREATININE 0.98 05/24/2019 0930      Component Value Date/Time   CALCIUM 8.8 (L) 05/24/2019 0930   ALKPHOS 58 05/24/2019 0930   AST 14 (L) 05/24/2019 0930   ALT 23 05/24/2019 0930   BILITOT 0.3 05/24/2019 0930

## 2019-05-24 ENCOUNTER — Inpatient Hospital Stay: Payer: BC Managed Care – PPO

## 2019-05-24 ENCOUNTER — Other Ambulatory Visit: Payer: Self-pay

## 2019-05-24 ENCOUNTER — Encounter: Payer: Self-pay | Admitting: Hematology

## 2019-05-24 ENCOUNTER — Inpatient Hospital Stay: Payer: BC Managed Care – PPO | Attending: Hematology

## 2019-05-24 ENCOUNTER — Inpatient Hospital Stay (HOSPITAL_BASED_OUTPATIENT_CLINIC_OR_DEPARTMENT_OTHER): Payer: BC Managed Care – PPO | Admitting: Hematology

## 2019-05-24 VITALS — BP 134/74 | HR 89 | Temp 98.1°F | Resp 18

## 2019-05-24 VITALS — BP 125/72 | HR 72 | Temp 97.9°F | Resp 17

## 2019-05-24 DIAGNOSIS — Z5111 Encounter for antineoplastic chemotherapy: Secondary | ICD-10-CM | POA: Insufficient documentation

## 2019-05-24 DIAGNOSIS — D6481 Anemia due to antineoplastic chemotherapy: Secondary | ICD-10-CM | POA: Diagnosis not present

## 2019-05-24 DIAGNOSIS — C8339 Diffuse large B-cell lymphoma, extranodal and solid organ sites: Secondary | ICD-10-CM | POA: Diagnosis not present

## 2019-05-24 DIAGNOSIS — D72829 Elevated white blood cell count, unspecified: Secondary | ICD-10-CM | POA: Diagnosis not present

## 2019-05-24 DIAGNOSIS — Z5189 Encounter for other specified aftercare: Secondary | ICD-10-CM | POA: Insufficient documentation

## 2019-05-24 DIAGNOSIS — C8338 Diffuse large B-cell lymphoma, lymph nodes of multiple sites: Secondary | ICD-10-CM | POA: Diagnosis present

## 2019-05-24 DIAGNOSIS — T451X5A Adverse effect of antineoplastic and immunosuppressive drugs, initial encounter: Secondary | ICD-10-CM

## 2019-05-24 DIAGNOSIS — M79604 Pain in right leg: Secondary | ICD-10-CM

## 2019-05-24 DIAGNOSIS — Z5112 Encounter for antineoplastic immunotherapy: Secondary | ICD-10-CM | POA: Insufficient documentation

## 2019-05-24 DIAGNOSIS — Z95828 Presence of other vascular implants and grafts: Secondary | ICD-10-CM

## 2019-05-24 DIAGNOSIS — B370 Candidal stomatitis: Secondary | ICD-10-CM

## 2019-05-24 LAB — CBC WITH DIFFERENTIAL (CANCER CENTER ONLY)
Abs Immature Granulocytes: 0.23 10*3/uL — ABNORMAL HIGH (ref 0.00–0.07)
Basophils Absolute: 0 10*3/uL (ref 0.0–0.1)
Basophils Relative: 0 %
Eosinophils Absolute: 0 10*3/uL (ref 0.0–0.5)
Eosinophils Relative: 0 %
HCT: 32.8 % — ABNORMAL LOW (ref 39.0–52.0)
Hemoglobin: 10.5 g/dL — ABNORMAL LOW (ref 13.0–17.0)
Immature Granulocytes: 2 %
Lymphocytes Relative: 3 %
Lymphs Abs: 0.4 10*3/uL — ABNORMAL LOW (ref 0.7–4.0)
MCH: 30.1 pg (ref 26.0–34.0)
MCHC: 32 g/dL (ref 30.0–36.0)
MCV: 94 fL (ref 80.0–100.0)
Monocytes Absolute: 0.3 10*3/uL (ref 0.1–1.0)
Monocytes Relative: 3 %
Neutro Abs: 10.6 10*3/uL — ABNORMAL HIGH (ref 1.7–7.7)
Neutrophils Relative %: 92 %
Platelet Count: 199 10*3/uL (ref 150–400)
RBC: 3.49 MIL/uL — ABNORMAL LOW (ref 4.22–5.81)
RDW: 15.9 % — ABNORMAL HIGH (ref 11.5–15.5)
WBC Count: 11.6 10*3/uL — ABNORMAL HIGH (ref 4.0–10.5)
nRBC: 0 % (ref 0.0–0.2)

## 2019-05-24 LAB — CMP (CANCER CENTER ONLY)
ALT: 23 U/L (ref 0–44)
AST: 14 U/L — ABNORMAL LOW (ref 15–41)
Albumin: 4.2 g/dL (ref 3.5–5.0)
Alkaline Phosphatase: 58 U/L (ref 38–126)
Anion gap: 11 (ref 5–15)
BUN: 25 mg/dL — ABNORMAL HIGH (ref 6–20)
CO2: 27 mmol/L (ref 22–32)
Calcium: 8.8 mg/dL — ABNORMAL LOW (ref 8.9–10.3)
Chloride: 103 mmol/L (ref 98–111)
Creatinine: 0.98 mg/dL (ref 0.61–1.24)
GFR, Est AFR Am: >60 mL/min (ref >60)
GFR, Estimated: >60 mL/min (ref >60)
Glucose, Bld: 271 mg/dL — ABNORMAL HIGH (ref 70–99)
Potassium: 3.7 mmol/L (ref 3.5–5.1)
Sodium: 141 mmol/L (ref 135–145)
Total Bilirubin: 0.3 mg/dL (ref 0.3–1.2)
Total Protein: 6.2 g/dL — ABNORMAL LOW (ref 6.5–8.1)

## 2019-05-24 MED ORDER — SODIUM CHLORIDE 0.9% FLUSH
3.0000 mL | INTRAVENOUS | Status: DC | PRN
  Filled 2019-05-24: qty 10

## 2019-05-24 MED ORDER — PALONOSETRON HCL INJECTION 0.25 MG/5ML
INTRAVENOUS | Status: AC
  Filled 2019-05-24: qty 5

## 2019-05-24 MED ORDER — SODIUM CHLORIDE 0.9 % IV SOLN
375.0000 mg/m2 | Freq: Once | INTRAVENOUS | Status: AC
  Administered 2019-05-24: 800 mg via INTRAVENOUS
  Filled 2019-05-24: qty 50

## 2019-05-24 MED ORDER — SODIUM CHLORIDE 0.9% FLUSH
10.0000 mL | Freq: Once | INTRAVENOUS | Status: AC
  Administered 2019-05-24: 10 mL via INTRAVENOUS
  Filled 2019-05-24: qty 10

## 2019-05-24 MED ORDER — ALTEPLASE 2 MG IJ SOLR
2.0000 mg | Freq: Once | INTRAMUSCULAR | Status: DC | PRN
  Filled 2019-05-24: qty 2

## 2019-05-24 MED ORDER — SODIUM CHLORIDE 0.9 % IV SOLN
750.0000 mg/m2 | Freq: Once | INTRAVENOUS | Status: AC
  Administered 2019-05-24: 1540 mg via INTRAVENOUS
  Filled 2019-05-24: qty 77

## 2019-05-24 MED ORDER — ACETAMINOPHEN 325 MG PO TABS
ORAL_TABLET | ORAL | Status: AC
  Filled 2019-05-24: qty 2

## 2019-05-24 MED ORDER — PALONOSETRON HCL INJECTION 0.25 MG/5ML
0.2500 mg | Freq: Once | INTRAVENOUS | Status: AC
  Administered 2019-05-24: 0.25 mg via INTRAVENOUS

## 2019-05-24 MED ORDER — HEPARIN SOD (PORK) LOCK FLUSH 100 UNIT/ML IV SOLN
250.0000 [IU] | Freq: Once | INTRAVENOUS | Status: DC | PRN
  Filled 2019-05-24: qty 5

## 2019-05-24 MED ORDER — SODIUM CHLORIDE 0.9 % IV SOLN
Freq: Once | INTRAVENOUS | Status: AC
  Administered 2019-05-24: 10:00:00 via INTRAVENOUS
  Filled 2019-05-24: qty 250

## 2019-05-24 MED ORDER — DIPHENHYDRAMINE HCL 25 MG PO CAPS
ORAL_CAPSULE | ORAL | Status: AC
  Filled 2019-05-24: qty 2

## 2019-05-24 MED ORDER — ACETAMINOPHEN 325 MG PO TABS
650.0000 mg | ORAL_TABLET | Freq: Once | ORAL | Status: AC
  Administered 2019-05-24: 650 mg via ORAL

## 2019-05-24 MED ORDER — DIPHENHYDRAMINE HCL 25 MG PO CAPS
50.0000 mg | ORAL_CAPSULE | Freq: Once | ORAL | Status: AC
  Administered 2019-05-24: 50 mg via ORAL

## 2019-05-24 MED ORDER — VINCRISTINE SULFATE CHEMO INJECTION 1 MG/ML
2.0000 mg | Freq: Once | INTRAVENOUS | Status: AC
  Administered 2019-05-24: 2 mg via INTRAVENOUS
  Filled 2019-05-24: qty 2

## 2019-05-24 MED ORDER — HEPARIN SOD (PORK) LOCK FLUSH 100 UNIT/ML IV SOLN
500.0000 [IU] | Freq: Once | INTRAVENOUS | Status: AC | PRN
  Administered 2019-05-24: 500 [IU]
  Filled 2019-05-24: qty 5

## 2019-05-24 MED ORDER — DOXORUBICIN HCL CHEMO IV INJECTION 2 MG/ML
49.0000 mg/m2 | Freq: Once | INTRAVENOUS | Status: AC
  Administered 2019-05-24: 100 mg via INTRAVENOUS
  Filled 2019-05-24: qty 50

## 2019-05-24 MED ORDER — DEXAMETHASONE SODIUM PHOSPHATE 10 MG/ML IJ SOLN
INTRAMUSCULAR | Status: AC
  Filled 2019-05-24: qty 1

## 2019-05-24 MED ORDER — DEXAMETHASONE SODIUM PHOSPHATE 10 MG/ML IJ SOLN
10.0000 mg | Freq: Once | INTRAMUSCULAR | Status: AC
  Administered 2019-05-24: 10:00:00 10 mg via INTRAVENOUS

## 2019-05-24 MED ORDER — SODIUM CHLORIDE 0.9% FLUSH
10.0000 mL | INTRAVENOUS | Status: DC | PRN
  Administered 2019-05-24: 10 mL
  Filled 2019-05-24: qty 10

## 2019-05-24 NOTE — Patient Instructions (Signed)
Cyclophosphamide injection What is this medicine? CYCLOPHOSPHAMIDE (sye kloe FOSS fa mide) is a chemotherapy drug. It slows the growth of cancer cells. This medicine is used to treat many types of cancer like lymphoma, myeloma, leukemia, breast cancer, and ovarian cancer, to name a few. This medicine may be used for other purposes; ask your health care provider or pharmacist if you have questions. COMMON BRAND NAME(S): Cytoxan, Neosar What should I tell my health care provider before I take this medicine? They need to know if you have any of these conditions: -blood disorders -history of other chemotherapy -infection -kidney disease -liver disease -recent or ongoing radiation therapy -tumors in the bone marrow -an unusual or allergic reaction to cyclophosphamide, other chemotherapy, other medicines, foods, dyes, or preservatives -pregnant or trying to get pregnant -breast-feeding How should I use this medicine? This drug is usually given as an injection into a vein or muscle or by infusion into a vein. It is administered in a hospital or clinic by a specially trained health care professional. Talk to your pediatrician regarding the use of this medicine in children. Special care may be needed. Overdosage: If you think you have taken too much of this medicine contact a poison control center or emergency room at once. NOTE: This medicine is only for you. Do not share this medicine with others. What if I miss a dose? It is important not to miss your dose. Call your doctor or health care professional if you are unable to keep an appointment. What may interact with this medicine? This medicine may interact with the following medications: -amiodarone -amphotericin B -azathioprine -certain antiviral medicines for HIV or AIDS such as protease inhibitors (e.g., indinavir, ritonavir) and zidovudine -certain blood pressure medications such as benazepril, captopril, enalapril, fosinopril,  lisinopril, moexipril, monopril, perindopril, quinapril, ramipril, trandolapril -certain cancer medications such as anthracyclines (e.g., daunorubicin, doxorubicin), busulfan, cytarabine, paclitaxel, pentostatin, tamoxifen, trastuzumab -certain diuretics such as chlorothiazide, chlorthalidone, hydrochlorothiazide, indapamide, metolazone -certain medicines that treat or prevent blood clots like warfarin -certain muscle relaxants such as succinylcholine -cyclosporine -etanercept -indomethacin -medicines to increase blood counts like filgrastim, pegfilgrastim, sargramostim -medicines used as general anesthesia -metronidazole -natalizumab This list may not describe all possible interactions. Give your health care provider a list of all the medicines, herbs, non-prescription drugs, or dietary supplements you use. Also tell them if you smoke, drink alcohol, or use illegal drugs. Some items may interact with your medicine. What should I watch for while using this medicine? Visit your doctor for checks on your progress. This drug may make you feel generally unwell. This is not uncommon, as chemotherapy can affect healthy cells as well as cancer cells. Report any side effects. Continue your course of treatment even though you feel ill unless your doctor tells you to stop. Drink water or other fluids as directed. Urinate often, even at night. In some cases, you may be given additional medicines to help with side effects. Follow all directions for their use. Call your doctor or health care professional for advice if you get a fever, chills or sore throat, or other symptoms of a cold or flu. Do not treat yourself. This drug decreases your body's ability to fight infections. Try to avoid being around people who are sick. This medicine may increase your risk to bruise or bleed. Call your doctor or health care professional if you notice any unusual bleeding. Be careful brushing and flossing your teeth or using a  toothpick because you may get an infection or bleed   more easily. If you have any dental work done, tell your dentist you are receiving this medicine. You may get drowsy or dizzy. Do not drive, use machinery, or do anything that needs mental alertness until you know how this medicine affects you. Do not become pregnant while taking this medicine or for 1 year after stopping it. Women should inform their doctor if they wish to become pregnant or think they might be pregnant. Men should not father a child while taking this medicine and for 4 months after stopping it. There is a potential for serious side effects to an unborn child. Talk to your health care professional or pharmacist for more information. Do not breast-feed an infant while taking this medicine. This medicine may interfere with the ability to have a child. This medicine has caused ovarian failure in some women. This medicine has caused reduced sperm counts in some men. You should talk with your doctor or health care professional if you are concerned about your fertility. If you are going to have surgery, tell your doctor or health care professional that you have taken this medicine. What side effects may I notice from receiving this medicine? Side effects that you should report to your doctor or health care professional as soon as possible: -allergic reactions like skin rash, itching or hives, swelling of the face, lips, or tongue -low blood counts - this medicine may decrease the number of white blood cells, red blood cells and platelets. You may be at increased risk for infections and bleeding. -signs of infection - fever or chills, cough, sore throat, pain or difficulty passing urine -signs of decreased platelets or bleeding - bruising, pinpoint red spots on the skin, black, tarry stools, blood in the urine -signs of decreased red blood cells - unusually weak or tired, fainting spells, lightheadedness -breathing problems -dark  urine -dizziness -palpitations -swelling of the ankles, feet, hands -trouble passing urine or change in the amount of urine -weight gain -yellowing of the eyes or skin Side effects that usually do not require medical attention (report to your doctor or health care professional if they continue or are bothersome): -changes in nail or skin color -hair loss -missed menstrual periods -mouth sores -nausea, vomiting This list may not describe all possible side effects. Call your doctor for medical advice about side effects. You may report side effects to FDA at 1-800-FDA-1088. Where should I keep my medicine? This drug is given in a hospital or clinic and will not be stored at home. NOTE: This sheet is a summary. It may not cover all possible information. If you have questions about this medicine, talk to your doctor, pharmacist, or health care provider.  2019 Elsevier/Gold Standard (2012-10-13 16:22:58) Vincristine injection What is this medicine? VINCRISTINE (vin KRIS teen) is a chemotherapy drug. It slows the growth of cancer cells. This medicine is used to treat many types of cancer like Hodgkin's disease, leukemia, non-Hodgkin's lymphoma, neuroblastoma (brain cancer), rhabdomyosarcoma, and Wilms' tumor. This medicine may be used for other purposes; ask your health care provider or pharmacist if you have questions. COMMON BRAND NAME(S): Oncovin, Vincasar PFS What should I tell my health care provider before I take this medicine? They need to know if you have any of these conditions: -blood disorders -gout -infection (especially chickenpox, cold sores, or herpes) -kidney disease -liver disease -lung disease -nervous system disease like Charcot-Marie-Tooth (CMT) -recent or ongoing radiation therapy -an unusual or allergic reaction to vincristine, other chemotherapy agents, other medicines, foods, dyes, or  preservatives -pregnant or trying to get pregnant -breast-feeding How should I  use this medicine? This drug is given as an infusion into a vein. It is administered in a hospital or clinic by a specially trained health care professional. If you have pain, swelling, burning, or any unusual feeling around the site of your injection, tell your health care professional right away. Talk to your pediatrician regarding the use of this medicine in children. While this drug may be prescribed for selected conditions, precautions do apply. Overdosage: If you think you have taken too much of this medicine contact a poison control center or emergency room at once. NOTE: This medicine is only for you. Do not share this medicine with others. What if I miss a dose? It is important not to miss your dose. Call your doctor or health care professional if you are unable to keep an appointment. What may interact with this medicine? Do not take this medicine with any of the following medications: -itraconazole -mibefradil -voriconazole This medicine may also interact with the following medications: -cyclosporine -erythromycin -fluconazole -ketoconazole -medicines for HIV like delavirdine, efavirenz, nevirapine -medicines for seizures like ethotoin, fosphenotoin, phenytoin -medicines to increase blood counts like filgrastim, pegfilgrastim, sargramostim -other chemotherapy drugs like cisplatin, L-asparaginase, methotrexate, mitomycin, paclitaxel -pegaspargase -vaccines -zalcitabine, ddC Talk to your doctor or health care professional before taking any of these medicines: -acetaminophen -aspirin -ibuprofen -ketoprofen -naproxen This list may not describe all possible interactions. Give your health care provider a list of all the medicines, herbs, non-prescription drugs, or dietary supplements you use. Also tell them if you smoke, drink alcohol, or use illegal drugs. Some items may interact with your medicine. What should I watch for while using this medicine? Your condition will be  monitored carefully while you are receiving this medicine. You will need important blood work done while you are taking this medicine. This drug may make you feel generally unwell. This is not uncommon, as chemotherapy can affect healthy cells as well as cancer cells. Report any side effects. Continue your course of treatment even though you feel ill unless your doctor tells you to stop. In some cases, you may be given additional medicines to help with side effects. Follow all directions for their use. Call your doctor or health care professional for advice if you get a fever, chills or sore throat, or other symptoms of a cold or flu. Do not treat yourself. Avoid taking products that contain aspirin, acetaminophen, ibuprofen, naproxen, or ketoprofen unless instructed by your doctor. These medicines may hide a fever. Do not become pregnant while taking this medicine. Women should inform their doctor if they wish to become pregnant or think they might be pregnant. There is a potential for serious side effects to an unborn child. Talk to your health care professional or pharmacist for more information. Do not breast-feed an infant while taking this medicine. Men may have a lower sperm count while taking this medicine. Talk to your doctor if you plan to father a child. What side effects may I notice from receiving this medicine? Side effects that you should report to your doctor or health care professional as soon as possible: -allergic reactions like skin rash, itching or hives, swelling of the face, lips, or tongue -breathing problems -confusion or changes in emotions or moods -constipation -cough -mouth sores -muscle weakness -nausea and vomiting -pain, swelling, redness or irritation at the injection site -pain, tingling, numbness in the hands or feet -problems with balance, talking, walking -seizures -stomach   pain -trouble passing urine or change in the amount of urine Side effects that  usually do not require medical attention (report to your doctor or health care professional if they continue or are bothersome): -diarrhea -hair loss -jaw pain -loss of appetite This list may not describe all possible side effects. Call your doctor for medical advice about side effects. You may report side effects to FDA at 1-800-FDA-1088. Where should I keep my medicine? This drug is given in a hospital or clinic and will not be stored at home. NOTE: This sheet is a summary. It may not cover all possible information. If you have questions about this medicine, talk to your doctor, pharmacist, or health care provider.  2019 Elsevier/Gold Standard (2008-08-26 17:17:13) Doxorubicin injection What is this medicine? DOXORUBICIN (dox oh ROO bi sin) is a chemotherapy drug. It is used to treat many kinds of cancer like leukemia, lymphoma, neuroblastoma, sarcoma, and Wilms' tumor. It is also used to treat bladder cancer, breast cancer, lung cancer, ovarian cancer, stomach cancer, and thyroid cancer. This medicine may be used for other purposes; ask your health care provider or pharmacist if you have questions. COMMON BRAND NAME(S): Adriamycin, Adriamycin PFS, Adriamycin RDF, Rubex What should I tell my health care provider before I take this medicine? They need to know if you have any of these conditions: -heart disease -history of low blood counts caused by a medicine -liver disease -recent or ongoing radiation therapy -an unusual or allergic reaction to doxorubicin, other chemotherapy agents, other medicines, foods, dyes, or preservatives -pregnant or trying to get pregnant -breast-feeding How should I use this medicine? This drug is given as an infusion into a vein. It is administered in a hospital or clinic by a specially trained health care professional. If you have pain, swelling, burning or any unusual feeling around the site of your injection, tell your health care professional right  away. Talk to your pediatrician regarding the use of this medicine in children. Special care may be needed. Overdosage: If you think you have taken too much of this medicine contact a poison control center or emergency room at once. NOTE: This medicine is only for you. Do not share this medicine with others. What if I miss a dose? It is important not to miss your dose. Call your doctor or health care professional if you are unable to keep an appointment. What may interact with this medicine? This medicine may interact with the following medications: -6-mercaptopurine -paclitaxel -phenytoin -St. John's Wort -trastuzumab -verapamil This list may not describe all possible interactions. Give your health care provider a list of all the medicines, herbs, non-prescription drugs, or dietary supplements you use. Also tell them if you smoke, drink alcohol, or use illegal drugs. Some items may interact with your medicine. What should I watch for while using this medicine? This drug may make you feel generally unwell. This is not uncommon, as chemotherapy can affect healthy cells as well as cancer cells. Report any side effects. Continue your course of treatment even though you feel ill unless your doctor tells you to stop. There is a maximum amount of this medicine you should receive throughout your life. The amount depends on the medical condition being treated and your overall health. Your doctor will watch how much of this medicine you receive in your lifetime. Tell your doctor if you have taken this medicine before. You may need blood work done while you are taking this medicine. Your urine may turn red for a  few days after your dose. This is not blood. If your urine is dark or brown, call your doctor. In some cases, you may be given additional medicines to help with side effects. Follow all directions for their use. Call your doctor or health care professional for advice if you get a fever, chills or  sore throat, or other symptoms of a cold or flu. Do not treat yourself. This drug decreases your body's ability to fight infections. Try to avoid being around people who are sick. This medicine may increase your risk to bruise or bleed. Call your doctor or health care professional if you notice any unusual bleeding. Talk to your doctor about your risk of cancer. You may be more at risk for certain types of cancers if you take this medicine. Do not become pregnant while taking this medicine or for 6 months after stopping it. Women should inform their doctor if they wish to become pregnant or think they might be pregnant. Men should not father a child while taking this medicine and for 6 months after stopping it. There is a potential for serious side effects to an unborn child. Talk to your health care professional or pharmacist for more information. Do not breast-feed an infant while taking this medicine. This medicine has caused ovarian failure in some women and reduced sperm counts in some men This medicine may interfere with the ability to have a child. Talk with your doctor or health care professional if you are concerned about your fertility. This medicine may cause a decrease in Co-Enzyme Q-10. You should make sure that you get enough Co-Enzyme Q-10 while you are taking this medicine. Discuss the foods you eat and the vitamins you take with your health care professional. What side effects may I notice from receiving this medicine? Side effects that you should report to your doctor or health care professional as soon as possible: -allergic reactions like skin rash, itching or hives, swelling of the face, lips, or tongue -breathing problems -chest pain -fast or irregular heartbeat -low blood counts - this medicine may decrease the number of white blood cells, red blood cells and platelets. You may be at increased risk for infections and bleeding. -pain, redness, or irritation at site where  injected -signs of infection - fever or chills, cough, sore throat, pain or difficulty passing urine -signs of decreased platelets or bleeding - bruising, pinpoint red spots on the skin, black, tarry stools, blood in the urine -swelling of the ankles, feet, hands -tiredness -weakness Side effects that usually do not require medical attention (report to your doctor or health care professional if they continue or are bothersome): -diarrhea -hair loss -mouth sores -nail discoloration or damage -nausea -red colored urine -vomiting This list may not describe all possible side effects. Call your doctor for medical advice about side effects. You may report side effects to FDA at 1-800-FDA-1088. Where should I keep my medicine? This drug is given in a hospital or clinic and will not be stored at home. NOTE: This sheet is a summary. It may not cover all possible information. If you have questions about this medicine, talk to your doctor, pharmacist, or health care provider.  2019 Elsevier/Gold Standard (2017-07-13 11:01:26) Rituximab injection What is this medicine? RITUXIMAB (ri TUX i mab) is a monoclonal antibody. It is used to treat certain types of cancer like non-Hodgkin lymphoma and chronic lymphocytic leukemia. It is also used to treat rheumatoid arthritis, granulomatosis with polyangiitis (or Wegener's granulomatosis), microscopic polyangiitis, and  pemphigus vulgaris. This medicine may be used for other purposes; ask your health care provider or pharmacist if you have questions. COMMON BRAND NAME(S): Rituxan What should I tell my health care provider before I take this medicine? They need to know if you have any of these conditions: -heart disease -infection (especially a virus infection such as hepatitis B, chickenpox, cold sores, or herpes) -immune system problems -irregular heartbeat -kidney disease -low blood counts, like low white cell, platelet, or red cell counts -lung or  breathing disease, like asthma -recently received or scheduled to receive a vaccine -an unusual or allergic reaction to rituximab, other medicines, foods, dyes, or preservatives -pregnant or trying to get pregnant -breast-feeding How should I use this medicine? This medicine is for infusion into a vein. It is administered in a hospital or clinic by a specially trained health care professional. A special MedGuide will be given to you by the pharmacist with each prescription and refill. Be sure to read this information carefully each time. Talk to your pediatrician regarding the use of this medicine in children. This medicine is not approved for use in children. Overdosage: If you think you have taken too much of this medicine contact a poison control center or emergency room at once. NOTE: This medicine is only for you. Do not share this medicine with others. What if I miss a dose? It is important not to miss a dose. Call your doctor or health care professional if you are unable to keep an appointment. What may interact with this medicine? -cisplatin -live virus vaccines This list may not describe all possible interactions. Give your health care provider a list of all the medicines, herbs, non-prescription drugs, or dietary supplements you use. Also tell them if you smoke, drink alcohol, or use illegal drugs. Some items may interact with your medicine. What should I watch for while using this medicine? Your condition will be monitored carefully while you are receiving this medicine. You may need blood work done while you are taking this medicine. This medicine can cause serious allergic reactions. To reduce your risk you may need to take medicine before treatment with this medicine. Take your medicine as directed. In some patients, this medicine may cause a serious brain infection that may cause death. If you have any problems seeing, thinking, speaking, walking, or standing, tell your healthcare  professional right away. If you cannot reach your healthcare professional, urgently seek other source of medical care. Call your doctor or health care professional for advice if you get a fever, chills or sore throat, or other symptoms of a cold or flu. Do not treat yourself. This drug decreases your body's ability to fight infections. Try to avoid being around people who are sick. Do not become pregnant while taking this medicine or for 12 months after stopping it. Women should inform their doctor if they wish to become pregnant or think they might be pregnant. There is a potential for serious side effects to an unborn child. Talk to your health care professional or pharmacist for more information. Do not breast-feed an infant while taking this medicine or for 6 months after stopping it. What side effects may I notice from receiving this medicine? Side effects that you should report to your doctor or health care professional as soon as possible: -allergic reactions like skin rash, itching or hives; swelling of the face, lips, or tongue -breathing problems -chest pain -changes in vision -diarrhea -headache with fever, neck stiffness, sensitivity to light, nausea,  or confusion -fast, irregular heartbeat -loss of memory -low blood counts - this medicine may decrease the number of white blood cells, red blood cells and platelets. You may be at increased risk for infections and bleeding. -mouth sores -problems with balance, talking, or walking -redness, blistering, peeling or loosening of the skin, including inside the mouth -signs of infection - fever or chills, cough, sore throat, pain or difficulty passing urine -signs and symptoms of kidney injury like trouble passing urine or change in the amount of urine -signs and symptoms of liver injury like dark yellow or brown urine; general ill feeling or flu-like symptoms; light-colored stools; loss of appetite; nausea; right upper belly pain; unusually  weak or tired; yellowing of the eyes or skin -signs and symptoms of low blood pressure like dizziness; feeling faint or lightheaded, falls; unusually weak or tired -stomach pain -swelling of the ankles, feet, hands -unusual bleeding or bruising -vomiting Side effects that usually do not require medical attention (report to your doctor or health care professional if they continue or are bothersome): -headache -joint pain -muscle cramps or muscle pain -nausea -tiredness This list may not describe all possible side effects. Call your doctor for medical advice about side effects. You may report side effects to FDA at 1-800-FDA-1088. Where should I keep my medicine? This drug is given in a hospital or clinic and will not be stored at home. NOTE: This sheet is a summary. It may not cover all possible information. If you have questions about this medicine, talk to your doctor, pharmacist, or health care provider.  2019 [employer] (2017-11-11 13:04:32)

## 2019-05-24 NOTE — Patient Instructions (Signed)

## 2019-05-25 ENCOUNTER — Inpatient Hospital Stay: Payer: BC Managed Care – PPO

## 2019-05-25 VITALS — BP 122/70 | HR 70 | Temp 97.9°F | Resp 18

## 2019-05-25 DIAGNOSIS — C8338 Diffuse large B-cell lymphoma, lymph nodes of multiple sites: Secondary | ICD-10-CM

## 2019-05-25 DIAGNOSIS — Z5112 Encounter for antineoplastic immunotherapy: Secondary | ICD-10-CM | POA: Diagnosis not present

## 2019-05-25 MED ORDER — PEGFILGRASTIM-CBQV 6 MG/0.6ML ~~LOC~~ SOSY
6.0000 mg | PREFILLED_SYRINGE | Freq: Once | SUBCUTANEOUS | Status: AC
  Administered 2019-05-25: 6 mg via SUBCUTANEOUS

## 2019-05-25 MED ORDER — PEGFILGRASTIM-CBQV 6 MG/0.6ML ~~LOC~~ SOSY
PREFILLED_SYRINGE | SUBCUTANEOUS | Status: AC
  Filled 2019-05-25: qty 0.6

## 2019-05-25 NOTE — Patient Instructions (Signed)
Pegfilgrastim injection  What is this medicine?  PEGFILGRASTIM (PEG fil gra stim) is a long-acting granulocyte colony-stimulating factor that stimulates the growth of neutrophils, a type of white blood cell important in the body's fight against infection. It is used to reduce the incidence of fever and infection in patients with certain types of cancer who are receiving chemotherapy that affects the bone marrow, and to increase survival after being exposed to high doses of radiation.  This medicine may be used for other purposes; ask your health care provider or pharmacist if you have questions.  COMMON BRAND NAME(S): Fulphila, Neulasta, UDENYCA  What should I tell my health care provider before I take this medicine?  They need to know if you have any of these conditions:  -kidney disease  -latex allergy  -ongoing radiation therapy  -sickle cell disease  -skin reactions to acrylic adhesives (On-Body Injector only)  -an unusual or allergic reaction to pegfilgrastim, filgrastim, other medicines, foods, dyes, or preservatives  -pregnant or trying to get pregnant  -breast-feeding  How should I use this medicine?  This medicine is for injection under the skin. If you get this medicine at home, you will be taught how to prepare and give the pre-filled syringe or how to use the On-body Injector. Refer to the patient Instructions for Use for detailed instructions. Use exactly as directed. Tell your healthcare provider immediately if you suspect that the On-body Injector may not have performed as intended or if you suspect the use of the On-body Injector resulted in a missed or partial dose.  It is important that you put your used needles and syringes in a special sharps container. Do not put them in a trash can. If you do not have a sharps container, call your pharmacist or healthcare provider to get one.  Talk to your pediatrician regarding the use of this medicine in children. While this drug may be prescribed for  selected conditions, precautions do apply.  Overdosage: If you think you have taken too much of this medicine contact a poison control center or emergency room at once.  NOTE: This medicine is only for you. Do not share this medicine with others.  What if I miss a dose?  It is important not to miss your dose. Call your doctor or health care professional if you miss your dose. If you miss a dose due to an On-body Injector failure or leakage, a new dose should be administered as soon as possible using a single prefilled syringe for manual use.  What may interact with this medicine?  Interactions have not been studied.  Give your health care provider a list of all the medicines, herbs, non-prescription drugs, or dietary supplements you use. Also tell them if you smoke, drink alcohol, or use illegal drugs. Some items may interact with your medicine.  This list may not describe all possible interactions. Give your health care provider a list of all the medicines, herbs, non-prescription drugs, or dietary supplements you use. Also tell them if you smoke, drink alcohol, or use illegal drugs. Some items may interact with your medicine.  What should I watch for while using this medicine?  You may need blood work done while you are taking this medicine.  If you are going to need a MRI, CT scan, or other procedure, tell your doctor that you are using this medicine (On-Body Injector only).  What side effects may I notice from receiving this medicine?  Side effects that you should report to   your doctor or health care professional as soon as possible:  -allergic reactions like skin rash, itching or hives, swelling of the face, lips, or tongue  -back pain  -dizziness  -fever  -pain, redness, or irritation at site where injected  -pinpoint red spots on the skin  -red or dark-brown urine  -shortness of breath or breathing problems  -stomach or side pain, or pain at the shoulder  -swelling  -tiredness  -trouble passing urine or  change in the amount of urine  Side effects that usually do not require medical attention (report to your doctor or health care professional if they continue or are bothersome):  -bone pain  -muscle pain  This list may not describe all possible side effects. Call your doctor for medical advice about side effects. You may report side effects to FDA at 1-800-FDA-1088.  Where should I keep my medicine?  Keep out of the reach of children.  If you are using this medicine at home, you will be instructed on how to store it. Throw away any unused medicine after the expiration date on the label.  NOTE: This sheet is a summary. It may not cover all possible information. If you have questions about this medicine, talk to your doctor, pharmacist, or health care provider.   2019 Elsevier/Gold Standard (2018-03-06 16:57:08)

## 2019-05-29 ENCOUNTER — Telehealth: Payer: Self-pay | Admitting: Radiation Oncology

## 2019-05-29 NOTE — Progress Notes (Signed)
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  Plan: Finish up chemo, PET, ? radiation  MRI Brain: negative for any CNS involvement.  PET 03/29/2019: post 3 cycles of R-CHOP: Marked response to therapy of lymphoma, including significant improvement and resolution of nodal disease within the neck, chest, abdomen, and pelvis.  The dominant left scapular and right sacral lesions are significantly improved/resolved.  PET 01/15/2019: extensive FDG-avid adenopathy in the cervical, paratracheal, peri-aortic, mesenteric and iliac LN's, as well as extensive skeletal involvement throughout the axial and appendicular skeleton; expansile soft tissue lesions in the left scapula and right sacrum  CT Neck 12/31/2018: cervical lymphadenopathy (left supraclavicular LN 3 x 1.5 cm),  CT abd/pelvis 12/31/2018:  abnormal soft tissue masses surrounding bilateral kidneys (R 4.0 x 3.2cm; L 3 x 2.5cm) encasing the ureters, RP adenopathy (largest 3.4cm), and R internal iliac lymphadenopathy (conglomerate 4.5 x 2.5cm).  Paul Mason presented to the ER with complaints of bilateral shoulder pains, night sweats (x3-4 months), anemia, and low energy level.    Biopsies of Supraclavicular Lymph Nodes 01/12/2019   Past/Anticipated interventions by medical oncology, if any:  Dr. Maylon Peppers 05/24/2019 -S/p 5 cycles of R-CHOP and 2 cycles of HD-MTX  -Labs adequate today, proceed with Cycle 6 (and the last cycle) of R-CHOP -Patient will have one more cycle of HD-MTX for CNS ppx (currently scheduled from 6/24-6/28) at [facility] after this last cycle of systemic chemotherapy -We will plan to obtain PET in 3-4 weeks after completion of all chemotherapy to assess end-of-treatment response -Given that he had very bulky disease on initial presentation, including large scapular soft tissue masses, I believe ISRT should be considered for consolidation treatment -Therefore, I have referred the patient  to radiation oncology for consideration of ISRT, pending the end-of-treatment PET results . TREATMENT REGIMEN:  02/01/2019 - present: R-CHOP with Udenyca, plan for 6 cycles IT MTX with Cycle 1; HD MTX with Cycles 2, 4 and 6 of chemotherapy at [facility]- 3 cycles, one cycle to go.  Weight changes, if any, over the past 6 months: Fluctuating, had a drop to low 170's initially, hangs around the 190's range.  Recurrent fevers, or drenching night sweats, if any: No  SAFETY ISSUES:  Prior radiation? No  Pacemaker/ICD? No  Possible current pregnancy? N/A  Is the patient on methotrexate? No  Current Complaints / other details:   - Excision of left supraclavicular mass ( left neck) -Port a cath insertion 01/23/2019 -Right ankle pain, walks with slight limp. -Left shoulder pain.

## 2019-05-29 NOTE — Telephone Encounter (Signed)
Contacted patient to verify mychart visit for pre reg 

## 2019-05-30 ENCOUNTER — Ambulatory Visit
Admission: RE | Admit: 2019-05-30 | Discharge: 2019-05-30 | Disposition: A | Discharge status: Home / Self Care | Payer: BC Managed Care – PPO | Source: Ambulatory Visit | Attending: Radiation Oncology | Admitting: Radiation Oncology

## 2019-05-30 ENCOUNTER — Encounter: Payer: Self-pay | Admitting: Radiation Oncology

## 2019-05-30 ENCOUNTER — Other Ambulatory Visit: Payer: Self-pay

## 2019-05-30 VITALS — Ht 69.0 in | Wt 207.0 lb

## 2019-05-30 DIAGNOSIS — C8338 Diffuse large B-cell lymphoma, lymph nodes of multiple sites: Secondary | ICD-10-CM

## 2019-05-30 NOTE — Progress Notes (Signed)
Radiation Oncology         [phone] ________________________________  Name: Paul Mason        MRN: [id number]  Date of Service: 05/30/2019 DOB: June 25, 1966  QI:WLNLG, Herbie [place], MD  Tish Men, MD     REFERRING PHYSICIAN: Tish Men, MD   DIAGNOSIS: The encounter diagnosis was Diffuse large B-cell lymphoma of lymph nodes of multiple regions [facility]).   HISTORY OF PRESENT ILLNESS: Paul Mason is a 53 y.o. male seen at the request of Dr. Maylon Peppers for a history of diffuse large B-cell lymphoma in multiple sites. The patient  Was originally diagnosed in January 2020 with multiple sites including the left supraclavicular, soft tissue disease around bilateral kidneys, retroperitoneal adenopathy, cervical, mesenteric and iliac adenopathy as well as skeletal bulky disease in the left scapula, right scapula, and righ sacrum. He began systemic therapy with R-CHOP and Udencya as well as IT methotrexate x1, and high dose methotrexate with cycles 2, 4, and 6 of chemotherapy at [facility] with Dr. Cassell Clement. He has now completed this regimen and will follow up with Dr. Maylon Peppers on 06/19/2019. He will undergo repeat PET scan, but has had interval imaging including a PET in April 2020, and had marked response. He is seen today via Mychart for consideration of initial site radiotherapy.    PREVIOUS RADIATION THERAPY: No   PAST MEDICAL HISTORY:  Past Medical History:  Diagnosis Date  . Anemia   . Bilateral inguinal hernia 06/08/2017  . Cancer ([place]) 12/2018   Large B cell lymphoma  . Iron deficiency anemia 01/18/2019       PAST SURGICAL HISTORY: Past Surgical History:  Procedure Laterality Date  . EYE SURGERY     lasik eye surgery 12 years ago  . GANGLION CYST EXCISION Left 1984   Wrist  . HERNIA REPAIR    . INGUINAL HERNIA REPAIR Bilateral 06/08/2017   Procedure: OPEN REPAIR BILARTERAL INGUINAL HERNIA WITH MESH;  Surgeon: Fanny Skates, MD;  Location: [facility];  Service: General;  Laterality: Bilateral;  .  INSERTION OF MESH Bilateral 06/08/2017   Procedure: INSERTION OF MESH;  Surgeon: Fanny Skates, MD;  Location: [facility];  Service: General;  Laterality: Bilateral;  . MASS EXCISION Left 01/12/2019   Procedure: EXCISION OF LEFT SUPRACLAVICULAR MASS;  Surgeon: Fanny Skates, MD;  Location: [place];  Service: General;  Laterality: Left;  . PORTACATH PLACEMENT N/A 01/23/2019   Procedure: INSERTION PORT-A-CATH WITH  ULTRASOUND;  Surgeon: Fanny Skates, MD;  Location: [place];  Service: General;  Laterality: N/A;     FAMILY HISTORY: No family history on file.   SOCIAL HISTORY:  reports that he has never smoked. He has never used smokeless tobacco. He reports current alcohol use of about 1.0 standard drinks of alcohol per week. He reports that he does not use drugs.   ALLERGIES: Penicillins   MEDICATIONS:  Current Outpatient Medications  Medication Sig Dispense Refill  . acetaminophen (TYLENOL) 325 MG tablet Take 975 mg by mouth every 6 (six) hours as needed for mild pain.    Marland Kitchen allopurinol (ZYLOPRIM) 100 MG tablet Take 1 tablet (100 mg total) by mouth daily for 30 days. 30 tablet 5  . docusate sodium (COLACE) 100 MG capsule Take 100 mg by mouth 2 (two) times daily.    . ferrous sulfate 325 (65 FE) MG EC tablet Take 325 mg by mouth daily.     Marland Kitchen gabapentin (NEURONTIN) 600 MG tablet Take 1 tablet (600 mg total) by  mouth 2 (two) times daily. 180 tablet 1  . lidocaine-prilocaine (EMLA) cream Apply to affected area once 30 g 3  . LORazepam (ATIVAN) 0.5 MG tablet Take 1 tablet (0.5 mg total) by mouth every 6 (six) hours as needed (Nausea or vomiting). 30 tablet 0  . magic mouthwash w/lidocaine SOLN Take 5 mLs by mouth 4 (four) times daily as needed for mouth pain. 200 mL 5  . ondansetron (ZOFRAN) 8 MG tablet Take 1 tablet (8 mg total) by mouth 2 (two) times daily as needed for refractory nausea / vomiting. Start on day 3 after cyclophosphamide chemotherapy. 30 tablet 1  .  prochlorperazine (COMPAZINE) 10 MG tablet Take 1 tablet (10 mg total) by mouth every 6 (six) hours as needed (Nausea or vomiting). 30 tablet 6  . traMADol (ULTRAM) 50 MG tablet Take 1 tablet (50 mg total) by mouth every 8 (eight) hours as needed. 90 tablet 0   No current facility-administered medications for this encounter.      REVIEW OF SYSTEMS: On review of systems, the patient reports that he is doing well overall. He continues to have some intermittent pain in his left shoulder and right lower extremity, particularly his ankle which occasionally causes him to limp. He otherwise reports he is feeling well and denies any chest pain, shortness of breath, cough, fevers, chills, night sweats, unintended weight changes. He denies any bowel or bladder disturbances, and denies abdominal pain, nausea or vomiting. He denies any new musculoskeletal or joint aches or pains. A complete review of systems is obtained and is otherwise negative.     PHYSICAL EXAM:  Wt Readings from Last 3 Encounters:  05/03/19 197 lb (89.4 kg)  03/22/19 196 lb (88.9 kg)  03/15/19 194 lb (88 kg)   Temp Readings from Last 3 Encounters:  05/25/19 97.9 F (36.6 C) (Oral)  05/24/19 97.9 F (36.6 C) (Oral)  05/24/19 98.1 F (36.7 C) (Oral)   BP Readings from Last 3 Encounters:  05/25/19 122/70  05/24/19 125/72  05/24/19 134/74   Pulse Readings from Last 3 Encounters:  05/25/19 70  05/24/19 72  05/24/19 89    /10  In general this is a well appearing caucasian male in no acute distress. He's alert and oriented x4 and appropriate throughout the examination. Cardiopulmonary assessment is negative for acute distress and he exhibits normal effort.     ECOG = 1  0 - Asymptomatic (Fully active, able to carry on all predisease activities without restriction)  1 - Symptomatic but completely ambulatory (Restricted in physically strenuous activity but ambulatory and able to carry out work of a light or sedentary  nature. For example, light housework, office work)  2 - Symptomatic, <50% in bed during the day (Ambulatory and capable of all self care but unable to carry out any work activities. Up and about more than 50% of waking hours)  3 - Symptomatic, >50% in bed, but not bedbound (Capable of only limited self-care, confined to bed or chair 50% or more of waking hours)  4 - Bedbound (Completely disabled. Cannot carry on any self-care. Totally confined to bed or chair)  5 - Death   Eustace Pen MM, Creech RH, Tormey DC, et al. [phone]). "Toxicity and response criteria of the [facility]". [place] Oncol. 5 (6): 649-55    LABORATORY DATA:  Lab Results  Component Value Date   WBC 11.6 (H) 05/24/2019   HGB 10.5 (L) 05/24/2019   HCT 32.8 (L) 05/24/2019  MCV 94.0 05/24/2019   PLT 199 05/24/2019   Lab Results  Component Value Date   NA 141 05/24/2019   K 3.7 05/24/2019   CL 103 05/24/2019   CO2 27 05/24/2019   Lab Results  Component Value Date   ALT 23 05/24/2019   AST 14 (L) 05/24/2019   ALKPHOS 58 05/24/2019   BILITOT 0.3 05/24/2019      RADIOGRAPHY: No results found.     IMPRESSION/PLAN: 1. Diffuse B Cell Lymphoma with CR. Dr. Lisbeth Renshaw discusses the pathology findings and reviews the nature of lymphomatous disease.  We discussed the risks, benefits, short, and long term effects of radiotherapy, and the patient is interested in proceeding. Dr. Lisbeth Renshaw discusses the delivery and logistics of radiotherapy and anticipates a course of 3 weeks of radiotherapy. We will plan for simulation following his anticipated PET. He was given an appt for simulation on 07/10/2019. We reviewed consent and he gives verbal consent to proceed.   This encounter was provided by telemedicine platform Mychart.  The patient has given verbal consent for this type of encounter and has been advised to only accept a meeting of this type in a secure network environment. The time spent during this  encounter was 45 minutes. The attendants for this meeting include Blenda Nicely, RN, Dr. Lisbeth Renshaw, Hayden Pedro  and [name].  During the encounter,  Blenda Nicely, RN, Dr. Lisbeth Renshaw, and Hayden Pedro were located at [facility] Radiation Oncology Department.  Loura Pardon Bottger was located at home.    The above documentation reflects my direct findings during this shared patient visit. Please see the separate note by Dr. Lisbeth Renshaw on this date for the remainder of the patient's plan of care.    Carola Rhine, PAC

## 2019-06-04 ENCOUNTER — Emergency Department (HOSPITAL_COMMUNITY)
Admission: EM | Admit: 2019-06-04 | Discharge: 2019-06-04 | Disposition: A | Discharge status: Home / Self Care | Payer: BC Managed Care – PPO | Attending: Emergency Medicine | Admitting: Emergency Medicine

## 2019-06-04 ENCOUNTER — Other Ambulatory Visit: Payer: Self-pay

## 2019-06-04 ENCOUNTER — Encounter (HOSPITAL_COMMUNITY): Payer: Self-pay | Admitting: Emergency Medicine

## 2019-06-04 ENCOUNTER — Emergency Department (HOSPITAL_COMMUNITY): Payer: BC Managed Care – PPO

## 2019-06-04 DIAGNOSIS — Z79899 Other long term (current) drug therapy: Secondary | ICD-10-CM | POA: Insufficient documentation

## 2019-06-04 DIAGNOSIS — M25512 Pain in left shoulder: Secondary | ICD-10-CM | POA: Diagnosis not present

## 2019-06-04 DIAGNOSIS — Z8572 Personal history of non-Hodgkin lymphomas: Secondary | ICD-10-CM | POA: Insufficient documentation

## 2019-06-04 DIAGNOSIS — R221 Localized swelling, mass and lump, neck: Secondary | ICD-10-CM | POA: Diagnosis not present

## 2019-06-04 MED ORDER — OXYCODONE-ACETAMINOPHEN 5-325 MG PO TABS
1.0000 | ORAL_TABLET | Freq: Once | ORAL | Status: AC
  Administered 2019-06-04: 1 via ORAL
  Filled 2019-06-04: qty 1

## 2019-06-04 MED ORDER — OXYCODONE-ACETAMINOPHEN 5-325 MG PO TABS: 1.0000 | ORAL_TABLET | Freq: Four times a day (QID) | ORAL | 0 refills | Status: AC | PRN

## 2019-06-04 MED ORDER — OXYCODONE-ACETAMINOPHEN 5-325 MG PO TABS: 1.0000 | ORAL_TABLET | Freq: Four times a day (QID) | ORAL | 0 refills | Status: DC | PRN

## 2019-06-04 MED ORDER — IOHEXOL 300 MG/ML  SOLN
75.0000 mL | Freq: Once | INTRAMUSCULAR | Status: AC | PRN
  Administered 2019-06-04: 15:00:00 75 mL via INTRAVENOUS

## 2019-06-04 NOTE — ED Triage Notes (Signed)
Pt. Stated, Paul Mason had left shoulder pain for the last 6 months and I had biopsy on my neck and it seems swollen , I had it in February. I have chemo in [place].

## 2019-06-04 NOTE — ED Provider Notes (Signed)
[facility] EMERGENCY DEPARTMENT Provider Note   CSN: [id number] Arrival date & time: 06/04/19  6073     History   Chief Complaint Chief Complaint  Patient presents with   Shoulder Pain    HPI Paul Mason is a 53 y.o. male.     HPI Patient presents to the emergency department with neck swelling over the area where he had a biopsy done back in February.  The patient states that he has had some left shoulder discomfort over the last few months but got worse over the last couple weeks.  Patient states that nothing seems to make his condition better.  He states certain movements and palpation make the pain worse.  Patient states that he is getting treatment for the cancer at [facility] and here at [facility].  Patient states that he did not take any medications prior to arrival for symptoms.  The patient denies chest pain, shortness of breath, headache,blurred vision, neck pain, fever, cough, weakness, numbness, dizziness, anorexia, edema, abdominal pain, nausea, vomiting, diarrhea, rash, back pain, dysuria, hematemesis, bloody stool, near syncope, or syncope. Past Medical History:  Diagnosis Date   Anemia    Bilateral inguinal hernia 06/08/2017   Cancer ([place]) 12/2018   Large B cell lymphoma   Iron deficiency anemia 01/18/2019    Patient Active Problem List   Diagnosis Date Noted   Oral candidiasis 02/22/2019   Leukopenia due to antineoplastic chemotherapy ([place]) 02/09/2019   Chemotherapy-induced diarrhea 02/09/2019   Lower extremity pain, right 01/30/2019   Iron deficiency anemia 01/18/2019   Diffuse large B-cell lymphoma ([place]) 01/01/2019   Anemia due to antineoplastic chemotherapy 01/01/2019   Bilateral inguinal hernia 06/08/2017    Past Surgical History:  Procedure Laterality Date   EYE SURGERY     lasik eye surgery 12 years ago   GANGLION CYST EXCISION Left 1984   Wrist   HERNIA REPAIR     INGUINAL HERNIA REPAIR Bilateral  06/08/2017   Procedure: OPEN REPAIR BILARTERAL INGUINAL HERNIA WITH MESH;  Surgeon: Fanny Skates, MD;  [facility];  Service: General;  Laterality: Bilateral;   INSERTION OF MESH Bilateral 06/08/2017   Procedure: INSERTION OF MESH;  Surgeon: Fanny Skates, MD;  [facility];  Service: General;  Laterality: Bilateral;   MASS EXCISION Left 01/12/2019   Procedure: EXCISION OF LEFT SUPRACLAVICULAR MASS;  Surgeon: Fanny Skates, MD;  Location: [place];  Service: General;  Laterality: Left;   PORTACATH PLACEMENT N/A 01/23/2019   Procedure: INSERTION PORT-A-CATH WITH  ULTRASOUND;  Surgeon: Fanny Skates, MD;  Location: [place];  Service: General;  Laterality: N/A;        Home Medications    Prior to Admission medications   Medication Sig Start Date End Date Taking? Authorizing Provider  acetaminophen (TYLENOL) 325 MG tablet Take 975 mg by mouth every 6 (six) hours as needed for mild pain.    [provider]  allopurinol (ZYLOPRIM) 100 MG tablet Take 1 tablet (100 mg total) by mouth daily for 30 days. 02/22/19 03/24/19  Tish Men, MD  docusate sodium (COLACE) 100 MG capsule Take 100 mg by mouth 2 (two) times daily.    [provider]  ferrous sulfate 325 (65 FE) MG EC tablet Take 325 mg by mouth daily.     [provider]  gabapentin (NEURONTIN) 600 MG tablet Take 1 tablet (600 mg total) by mouth 2 (two) times daily. 04/17/19   Tish Men, MD  lidocaine-prilocaine (  EMLA) cream Apply to affected area once 01/30/19   Tish Men, MD  LORazepam (ATIVAN) 0.5 MG tablet Take 1 tablet (0.5 mg total) by mouth every 6 (six) hours as needed (Nausea or vomiting). 01/30/19   Tish Men, MD  magic mouthwash w/lidocaine SOLN Take 5 mLs by mouth 4 (four) times daily as needed for mouth pain. 03/02/19   Tish Men, MD  ondansetron (ZOFRAN) 8 MG tablet Take 1 tablet (8 mg total) by mouth 2 (two) times daily as needed for refractory nausea / vomiting. Start on day 3  after cyclophosphamide chemotherapy. 01/30/19   Tish Men, MD  prochlorperazine (COMPAZINE) 10 MG tablet Take 1 tablet (10 mg total) by mouth every 6 (six) hours as needed (Nausea or vomiting). 01/30/19   Tish Men, MD  traMADol (ULTRAM) 50 MG tablet Take 1 tablet (50 mg total) by mouth every 8 (eight) hours as needed. 01/29/19   Tish Men, MD    Family History Family History  Problem Relation Age of Onset   Lung cancer Paternal Grandfather    Non-Hodgkin's lymphoma Paternal Uncle     Social History Social History   Tobacco Use   Smoking status: Never Smoker   Smokeless tobacco: Never Used  Substance Use Topics   Alcohol use: Yes    Alcohol/week: 1.0 standard drinks    Types: 1 Cans of beer per week    Comment: Weekly   Drug use: No     Allergies   Penicillins   Review of Systems Review of Systems All other systems negative except as documented in the HPI. All pertinent positives and negatives as reviewed in the HPI.  Physical Exam Updated Vital Signs BP 129/84 (BP Location: Right Arm)    Pulse 97    Temp 99.1 F (37.3 C) (Oral)    Resp 17    Ht 5\' 9"  (1.753 m)    Wt 93.9 kg    SpO2 99%    BMI 30.57 kg/m   Physical Exam Vitals signs and nursing note reviewed.  Constitutional:      General: He is not in acute distress.    Appearance: He is well-developed.  HENT:     Head: Normocephalic and atraumatic.  Eyes:     Pupils: Pupils are equal, round, and reactive to light.  Neck:     Musculoskeletal: Normal range of motion and neck supple.  Cardiovascular:     Rate and Rhythm: Normal rate and regular rhythm.     Heart sounds: Normal heart sounds. No murmur. No friction rub. No gallop.   Pulmonary:     Effort: Pulmonary effort is normal. No respiratory distress.     Breath sounds: Normal breath sounds. No wheezing.  Abdominal:     General: Bowel sounds are normal. There is no distension.     Palpations: Abdomen is soft.     Tenderness: There is no abdominal  tenderness.  Skin:    General: Skin is warm and dry.     Capillary Refill: Capillary refill takes less than 2 seconds.     Findings: No erythema or rash.  Neurological:     General: No focal deficit present.     Mental Status: He is alert and oriented to person, place, and time.     Sensory: No sensory deficit.     Motor: No weakness or abnormal muscle tone.     Coordination: Coordination normal.     Deep Tendon Reflexes: Reflexes normal.  Psychiatric:  Behavior: Behavior normal.      ED Treatments / Results  Labs (all labs ordered are listed, but only abnormal results are displayed) Labs Reviewed - No data to display  EKG    Radiology No results found.  Procedures Procedures (including critical care time)  Medications Ordered in ED Medications  oxyCODONE-acetaminophen (PERCOCET/ROXICET) 5-325 MG per tablet 1 tablet (1 tablet Oral Given 06/04/19 1333)     Initial Impression / Assessment and Plan / ED Course  I have reviewed the triage vital signs and the nursing notes.  Pertinent labs & imaging results that were available during my care of the patient were reviewed by me and considered in my medical decision making (see chart for details).       We will get a CTs scan of his neck to determine if this area of swelling of the biopsy area is something to be concerned with.  Patient's shoulder pain is palpable and reproducible in the posterior area just above the scapula.  I feel that this is musculoskeletal in nature as it does not radiate from the neck.  Does not feel that the 2 areas are connected.  Patient is given pain control and advised of the plan.  Final Clinical Impressions(s) / ED Diagnoses   Final diagnoses:  None    ED Discharge Orders    None       Dalia Heading, PA-C 06/04/19 1436    Charlesetta Shanks, MD 06/13/19 [phone]

## 2019-06-04 NOTE — Discharge Instructions (Signed)
You will need to recheck with your oncologist about the area on the neck may be a re-occurrence of the lymphoma.

## 2019-06-04 NOTE — ED Notes (Signed)
Patient verbalizes understanding of discharge instructions. Opportunity for questioning and answers were provided. Armband removed by staff, pt discharged from ED.  

## 2019-06-14 MED ORDER — GENERIC EXTERNAL MEDICATION
20.00 | Status: DC
Start: 2019-06-17 — End: 2019-06-14

## 2019-06-14 MED ORDER — PROCHLORPERAZINE MALEATE 5 MG PO TABS
10.00 | ORAL_TABLET | ORAL | Status: DC
Start: ? — End: 2019-06-14

## 2019-06-14 MED ORDER — SENNOSIDES-DOCUSATE SODIUM 8.6-50 MG PO TABS
2.00 | ORAL_TABLET | ORAL | Status: DC
Start: 2019-06-17 — End: 2019-06-14

## 2019-06-14 MED ORDER — LORAZEPAM 0.5 MG PO TABS
0.50 | ORAL_TABLET | ORAL | Status: DC
Start: ? — End: 2019-06-14

## 2019-06-14 MED ORDER — ONDANSETRON HCL 8 MG PO TABS
8.00 | ORAL_TABLET | ORAL | Status: DC
Start: ? — End: 2019-06-14

## 2019-06-14 MED ORDER — MORPHINE SULFATE 15 MG PO TABS
30.00 | ORAL_TABLET | ORAL | Status: DC
Start: ? — End: 2019-06-14

## 2019-06-14 MED ORDER — SODIUM CHLORIDE 0.9 % IV SOLN
INTRAVENOUS | Status: DC
Start: ? — End: 2019-06-14

## 2019-06-14 MED ORDER — ALLOPURINOL 300 MG PO TABS
300.00 | ORAL_TABLET | ORAL | Status: DC
Start: 2019-06-18 — End: 2019-06-14

## 2019-06-14 MED ORDER — GENERIC EXTERNAL MEDICATION
20.00 | Status: DC
Start: ? — End: 2019-06-14

## 2019-06-14 MED ORDER — GABAPENTIN 300 MG PO CAPS
600.00 | ORAL_CAPSULE | ORAL | Status: DC
Start: 2019-06-17 — End: 2019-06-14

## 2019-06-14 MED ORDER — CYCLOBENZAPRINE HCL 5 MG PO TABS
5.00 | ORAL_TABLET | ORAL | Status: DC
Start: ? — End: 2019-06-14

## 2019-06-18 ENCOUNTER — Telehealth: Payer: Self-pay | Admitting: *Deleted

## 2019-06-18 DIAGNOSIS — C8338 Diffuse large B-cell lymphoma, lymph nodes of multiple sites: Secondary | ICD-10-CM

## 2019-06-18 MED ORDER — PANTOPRAZOLE SODIUM 40 MG PO TBEC
40.00 | DELAYED_RELEASE_TABLET | ORAL | Status: DC
Start: 2019-06-18 — End: 2019-06-18

## 2019-06-18 MED ORDER — DIPHENHYDRAMINE HCL 50 MG/ML IJ SOLN
25.00 | INTRAMUSCULAR | Status: DC
Start: ? — End: 2019-06-18

## 2019-06-18 MED ORDER — METHYLPREDNISOLONE SODIUM SUCC 125 MG IJ SOLR
125.00 | INTRAMUSCULAR | Status: DC
Start: ? — End: 2019-06-18

## 2019-06-18 MED ORDER — EPINEPHRINE PF 1 MG/ML IJ SOLN
0.30 | INTRAMUSCULAR | Status: DC
Start: ? — End: 2019-06-18

## 2019-06-18 MED ORDER — LACTULOSE 10 GM/15ML PO SOLN
10.00 | ORAL | Status: DC
Start: ? — End: 2019-06-18

## 2019-06-18 MED ORDER — DIPHENHYDRAMINE HCL 25 MG PO CAPS
25.00 | ORAL_CAPSULE | ORAL | Status: DC
Start: ? — End: 2019-06-18

## 2019-06-18 MED ORDER — POLYETHYLENE GLYCOL 3350 17 G PO PACK
17.00 | PACK | ORAL | Status: DC
Start: ? — End: 2019-06-18

## 2019-06-18 MED ORDER — SODIUM CHLORIDE 0.9 % IV SOLN
INTRAVENOUS | Status: DC
Start: ? — End: 2019-06-18

## 2019-06-18 MED ORDER — PROCHLORPERAZINE MALEATE 5 MG PO TABS
10.00 | ORAL_TABLET | ORAL | Status: DC
Start: ? — End: 2019-06-18

## 2019-06-18 MED ORDER — OLANZAPINE 10 MG PO TABS
10.00 | ORAL_TABLET | ORAL | Status: DC
Start: 2019-06-17 — End: 2019-06-18

## 2019-06-18 MED ORDER — ACETAMINOPHEN 325 MG PO TABS
650.00 | ORAL_TABLET | ORAL | Status: DC
Start: ? — End: 2019-06-18

## 2019-06-18 MED ORDER — MORPHINE SULFATE 4 MG/ML IJ SOLN
2.00 | INTRAMUSCULAR | Status: DC
Start: ? — End: 2019-06-18

## 2019-06-18 MED ORDER — ONDANSETRON HCL 8 MG PO TABS
8.00 | ORAL_TABLET | ORAL | Status: DC
Start: 2019-06-17 — End: 2019-06-18

## 2019-06-18 MED ORDER — LORAZEPAM 1 MG PO TABS
1.00 | ORAL_TABLET | ORAL | Status: DC
Start: ? — End: 2019-06-18

## 2019-06-18 NOTE — Progress Notes (Signed)
[place] OFFICE PROGRESS NOTE  Patient Care Team: Maury Dus, MD as PCP - General (Family Medicine)  HEME/ONC OVERVIEW: 1. 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, GCB subtype -01/2019:   PET showed extensive FDG-avid adenopathy in the cervical, paratracheal, peri-aortic, mesenteric and iliac LN's, as well as extensive skeletal involvement throughout the axial and appendicular skeleton; expansile soft tissue lesions in the left scapula and right sacrum  L supraclavicular LN bx showed DLBCL, Ki-67 70-80%, GCB subtype; FISH positive for Bcl-2 rearrangement (41.5%) but negative for Bcl-6 and Myc rearrangement   MRI brain negative for any CNS involvement  Poor prognosis by R-IPI; intermediate risk by CNS-IPI  -Mid-01/2019 - 05/2019: R-CHOP with Udenyca x 6 cycles  HD MTX after Cycles 2 and 4 of R-CHOP at [facility]   PET after 3 cycles of chemotherapy showed marked response   (A) Relapsing/refractory disease  -Late 05/2019: PET showed new FDG-avid left supraclavicular disease; interval reduction in abdominal LN's and soft tissue disease in the sacrum; LDH > 3000; bx confirmed relapsed DLBCL -06/2019 - present: R-ICE at [place]:  02/01/2019 - 05/25/2019: R-CHOP with Udenyca x 6 cycles - IT MTX with Cycle 1; HD MTX with Cycles 2 and 4 of chemotherapy   06/14/2019 - present: R-ICE at [place]:   Relapsed/refractory Stage IV DLBCL  -S/p 6 cycles of R-CHOP with HD MTX x 2  -Unfortunately, patient developed rapidly relapsing disease after 6 cycles of R-CHOP, bx confirmed -I discussed the case at length with Dr. Cassell Clement of hematology at [facility], and greatly appreciate her input/expertise -Patient was started on R-ICE on 06/14/2019, with plan for an additional 2-3 cycles to reduce disease burden as a bridge to either auto-SCT vs. CAR-T  -Cycle 2 of R-ICE due on 07/05/2019 -We will monitor his labs on  Monday/Thurs after each round of chemotherapy to assess any need for supportive transfusion and electrolytes  -Prophylaxis: acyclovir, allopurinol  Chemotherapy-associated anemia -Secondary to chemotherapy -Hgb 9.9 today  -Patient denies any symptom of bleeding -No indication for RBC transfusion; goal Hgb > 7  Chemotherapy-associated thrombocytopenia -Secondary to chemotherapy -Plts 60k today -Patient denies any symptoms of bleeding or excess bruising, such as epistaxis, hematochezia, melena, or hematuria -No indication for transfusion; goal plts > 10k   Elevated LFT's -Likely secondary to recent chemotherapy -ALT 108; AST, ALK phos and Tbili normal -We will monitor it for now   Orders Placed This Encounter  Procedures  . Magnesium    Standing Status:   Standing    Number of Occurrences:   20    Standing Expiration Date:   06/20/2020    All questions were answered. The patient knows to call the clinic with any problems, questions or concerns. No barriers to learning was detected.  Labs Monday/Thurs with supportive transfusion as needed. RTC. Return on 07/09/2019 for labs, port flush, Neulasta injection and clinic appt after 2nd cycle of R-ICE.   Tish Men, MD 06/21/2019 3:06 PM  CHIEF COMPLAINT: "I am just tired "  INTERVAL HISTORY: Mr. Paul Mason returns to clinic for follow-up of relapsing/refractory DLBCL.  Patient was seen by Dr. Cassell Clement of hematology at [facility] for Cycle 3 of HD MTX in late 05/2019 when exam was notable for enlarging left supraclavicular lymphadenopathy, concerning for relapsing disease.  Patient underwent repeat biopsy of left supraclavicular lymph node, which confirmed relapsing disease.  LDH was greater than 3000.  He was  admitted for salvage R-ICE on 06/14/2019.  He tolerated the treatment relatively well with symptomatic reduction of the left supraclavicular lymph node.  Post-chemotherapy, he developed some bilateral floaters, but MRI brain was negative, and  this was thought to be due to ocular toxicities from ifosfamide.  Patient presents to clinic today for lab monitoring and supportive transfusion as needed.  Mr. Monforte reports that he tolerated the first cycle of R-ICE relatively well with improvement in the left supraclavicular lymphadenopathy.  He feels more hydrated completion of chemotherapy.  He otherwise denies any fever, chill, chest pain, dyspnea, abdominal pain, nausea, vomiting, or diarrhea.  SUMMARY OF ONCOLOGIC HISTORY: Oncology History  Diffuse large B-cell lymphoma ([place])  12/31/2018 Imaging   CT neck: IMPRESSION: Enlarged cervical lymph nodes in the neck as above. Most prominent nodes are in the left supraclavicular region. This may be due to metastatic disease or lymphoma. No primary head and neck cancer identified.   12/31/2018 Imaging   CTA chest: IMPRESSION: 1. 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 Abnormal soft tissue associated with both kidneys and surrounding the proximal left ureter is also likely due to lymphoma. This soft tissue associated with the kidneys and left ureter results in mild bilateral hydronephrosis, left greater than right. 2. Small pleural effusions, mild pulmonary edema, ascites, and fluid and stranding in the mesentery is all likely due to third spacing of fluid. The mesenteric findings surrounds the pancreas but is probably part of the broader process of fluid third-spacing. Recommend clinical correlation to exclude signs of pancreatitis. If there is any concern for pancreatitis, recommend obtaining a lipase. 3. Small pulmonary nodules as above. 4. No pulmonary emboli. 5. Coronary artery calcifications.   12/31/2018 Imaging   CT abdomen/pelvis: IMPRESSION: 1. 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 Abnormal soft tissue associated with both kidneys and  surrounding the proximal left ureter is also likely due to lymphoma. This soft tissue associated with the kidneys and left ureter results in mild bilateral hydronephrosis, left greater than right. 2. Small pleural effusions, mild pulmonary edema, ascites, and fluid and stranding in the mesentery is all likely due to third spacing of fluid. The mesenteric findings surrounds the pancreas but is probably part of the broader process of fluid third-spacing. Recommend clinical correlation to exclude signs of pancreatitis. If there is any concern for pancreatitis, recommend obtaining a lipase. 3. Small pulmonary nodules as above. 4. No pulmonary emboli. 5. Coronary artery calcifications.   01/01/2019 Initial Diagnosis   Diffuse large B-cell lymphoma ([place])   01/12/2019 Procedure   Left supraclavicular LN biopsy by Dr. Dalbert Batman   01/12/2019 Pathology Results   Accession: [id number]  Tissue-Flow Cytometry - MONOCLONAL B-CELL POPULATION WITH EXPRESSION OF CD10 COMPRISES 80% OF ALL LYMPHOCYTES - SEE COMMENT   01/12/2019 Pathology Results   Accession: [id number]  Lymph node for lymphoma, Left supraclavicular - DIFFUSE LARGE B-CELL LYMPHOMA - SEE COMMENT Microscopic Comment The excisional biopsy material is composed diffuse lymphoid proliferation. The lymphocytes are medium to large with vesicular chromatin, inconspicuous to prominent nucleoli, scant cytoplasm and admixed small benign lymphocytes. The atypical cells are B-cells by CD20 immunohistochemistry with expression of CD10, bcl-6, and bcl-2. The proliferative rate is high by ki-67 (70-80%). The neoplastic cells do not express CD5 or EBV by in-situ hybridization. CD3 highlights background T-cells. Flow cytometry performed on the sample identified a kappa-restricted B-cell population with expression of CD10  that comprised 80% of all lymphocytes (See [id number]).   01/15/2019 Imaging   PET: IMPRESSION: 1. Extensive hypermetabolic adenopathy  in the cervical lymph nodes, paratracheal nodes, periaortic nodes and iliac nodes. 2. Hypermetabolic nodal metastasis within the mesentery of the small bowel colon. 3. Extensive skeletal metastasis within the axillary and appendicular skeleton with intense metabolic activity. Expansile soft tissue lesions in the superior aspect of the LEFT scapula and RIGHT sacrum. These expansile soft tissue bone metastasis may be best targets for biopsy. 4. Normal spleen.    02/01/2019 -  Chemotherapy   The patient had DOXOrubicin (ADRIAMYCIN) chemo injection 100 mg, 49.5 mg/m2 = 102 mg, Intravenous,  Once, 6 of 6 cycles Administration: 100 mg (02/01/2019), 100 mg (02/22/2019), 100 mg (03/22/2019), 100 mg (04/12/2019), 100 mg (05/03/2019), 100 mg (05/24/2019) palonosetron (ALOXI) injection 0.25 mg, 0.25 mg, Intravenous,  Once, 6 of 6 cycles Administration: 0.25 mg (02/01/2019), 0.25 mg (02/22/2019), 0.25 mg (03/22/2019), 0.25 mg (04/12/2019), 0.25 mg (05/03/2019), 0.25 mg (05/24/2019) pegfilgrastim-cbqv (UDENYCA) injection 6 mg, 6 mg, Subcutaneous, Once, 6 of 6 cycles Administration: 6 mg (02/02/2019), 6 mg (02/23/2019), 6 mg (03/23/2019), 6 mg (04/13/2019), 6 mg (05/04/2019), 6 mg (05/25/2019) vinCRIStine (ONCOVIN) 2 mg in sodium chloride 0.9 % 50 mL chemo infusion, 2 mg, Intravenous,  Once, 6 of 6 cycles Administration: 2 mg (02/01/2019), 2 mg (02/22/2019), 2 mg (03/22/2019), 2 mg (04/12/2019), 2 mg (05/03/2019), 2 mg (05/24/2019) methotrexate (PF) 12 mg in sodium chloride (PF) 0.9 % INTRATHECAL chemo injection, , Intrathecal,  Once, 1 of 1 cycle Administration:  (02/05/2019) riTUXimab (RITUXAN) 800 mg in sodium chloride 0.9 % 250 mL (2.4242 mg/mL) infusion, 375 mg/m2 = 800 mg, Intravenous,  Once, 1 of 1 cycle Administration: 800 mg (02/01/2019) cyclophosphamide (CYTOXAN) 1,540 mg in sodium chloride 0.9 % 250 mL chemo infusion, 750 mg/m2 = 1,540 mg, Intravenous,  Once, 6 of 6 cycles Administration: 1,540 mg (02/01/2019), 1,540 mg  (02/22/2019), 1,540 mg (03/22/2019), 1,540 mg (04/12/2019), 1,540 mg (05/03/2019), 1,540 mg (05/24/2019) riTUXimab (RITUXAN) 800 mg in sodium chloride 0.9 % 170 mL infusion, 375 mg/m2 = 800 mg (100 % of original dose 375 mg/m2), Intravenous,  Once, 5 of 5 cycles Dose modification: 375 mg/m2 (original dose 375 mg/m2, Cycle 2, Reason: Provider Judgment, Comment: Changing to rapid infusion, tolerated 1st dose well) Administration: 800 mg (02/22/2019), 800 mg (03/22/2019), 800 mg (04/12/2019), 800 mg (05/03/2019), 800 mg (05/24/2019)  for chemotherapy treatment.    03/29/2019 Imaging   PET (after 3 cycles of R-CHOP): IMPRESSION: 1. Marked response to therapy of lymphoma, including significant improvement and resolution of nodal disease within the neck, chest, abdomen, and pelvis. ([place] 3). 2. The dominant left scapular and right sacral lesions are significantly improved/resolved. There is increased heterogeneous marrow activity throughout, favored to be due to stimulation by chemotherapy. 3. Age advanced coronary artery atherosclerosis. Recommend assessment of coronary risk factors and consideration of medical therapy. 4.  Aortic Atherosclerosis (ICD10-I70.0).   06/11/2019 Imaging   PET (at [facility]): CONCLUSION:  1.  New area of hypermetabolic left supraclavicular adenopathy consistent with progressive disease. 2.  Marked interval decrease in size and uptake of mesenteric, peritracheal, periaortic, and iliac chain nodes. 3.  Decreased uptake within the destructive soft tissue lesion of the right hemisacrum. New areas of focally decreased uptake within the left scapular soft tissue lesion suggestive of focal necrosis. 4.  Splenomegaly with diffusely increased uptake which may indicate splenic involvement. 5.  Multilevel degenerative changes of the cervical spine with at least  moderate canal stenosis. This can be further evaluated with dedicated cervical spine MRI if clinically indicated.      REVIEW OF SYSTEMS:   Constitutional: ( - ) fevers, ( - )  chills , ( - ) night sweats Eyes: ( - ) blurriness of vision, ( - ) double vision, ( - ) watery eyes Ears, nose, mouth, throat, and face: ( - ) mucositis, ( - ) sore throat Respiratory: ( - ) cough, ( - ) dyspnea, ( - ) wheezes Cardiovascular: ( - ) palpitation, ( - ) chest discomfort, ( - ) lower extremity swelling Gastrointestinal:  ( - ) nausea, ( - ) heartburn, ( - ) change in bowel habits Skin: ( - ) abnormal skin rashes Lymphatics: ( - ) new lymphadenopathy, ( - ) easy bruising Neurological: ( - ) numbness, ( - ) tingling, ( - ) new weaknesses Behavioral/Psych: ( - ) mood change, ( - ) new changes  All other systems were reviewed with the patient and are negative.  I have reviewed the past medical history, past surgical history, social history and family history with the patient and they are unchanged from previous note.  ALLERGIES:  is allergic to penicillins.  MEDICATIONS:  Current Outpatient Medications  Medication Sig Dispense Refill  . acetaminophen (TYLENOL) 325 MG tablet Take 975 mg by mouth every 6 (six) hours as needed for mild pain.    Marland Kitchen allopurinol (ZYLOPRIM) 100 MG tablet Take 1 tablet (100 mg total) by mouth daily for 30 days. 30 tablet 5  . docusate sodium (COLACE) 100 MG capsule Take 100 mg by mouth 2 (two) times daily.    . ferrous sulfate 325 (65 FE) MG EC tablet Take 325 mg by mouth daily.     Marland Kitchen gabapentin (NEURONTIN) 600 MG tablet Take 1 tablet (600 mg total) by mouth 2 (two) times daily. 180 tablet 1  . lidocaine-prilocaine (EMLA) cream Apply to affected area once 30 g 3  . LORazepam (ATIVAN) 0.5 MG tablet Take 1 tablet (0.5 mg total) by mouth every 6 (six) hours as needed (Nausea or vomiting). 30 tablet 0  . magic mouthwash w/lidocaine SOLN Take 5 mLs by mouth 4 (four) times daily as needed for mouth pain. 200 mL 5  . ondansetron (ZOFRAN) 8 MG tablet Take 1 tablet (8 mg total) by mouth 2 (two) times  daily as needed for refractory nausea / vomiting. Start on day 3 after cyclophosphamide chemotherapy. 30 tablet 1  . oxyCODONE-acetaminophen (PERCOCET/ROXICET) 5-325 MG tablet Take 1 tablet by mouth every 6 (six) hours as needed for severe pain. 15 tablet 0  . prochlorperazine (COMPAZINE) 10 MG tablet Take 1 tablet (10 mg total) by mouth every 6 (six) hours as needed (Nausea or vomiting). 30 tablet 6  . traMADol (ULTRAM) 50 MG tablet Take 1 tablet (50 mg total) by mouth every 8 (eight) hours as needed. 90 tablet 0   No current facility-administered medications for this visit.    Facility-Administered Medications Ordered in Other Visits  Medication Dose Route Frequency Provider Last Rate Last Dose  . heparin lock flush 100 unit/mL  500 Units Intravenous Once Tish Men, MD        PHYSICAL EXAMINATION: ECOG PERFORMANCE STATUS: 1 - Symptomatic but completely ambulatory  Today's Vitals   06/21/19 1432  BP: 114/79  Pulse: (!) 109  Resp: 19  SpO2: 100%  Weight: 198 lb (89.8 kg)   Body mass index is 29.24 kg/m.  Filed Weights   06/21/19  1432  Weight: 198 lb (89.8 kg)    GENERAL: alert, no distress and comfortable SKIN: skin color, texture, turgor are normal, no rashes or significant lesions EYES: conjunctiva are pink and non-injected, sclera clear OROPHARYNX: no exudate, no erythema; lips, buccal mucosa, and tongue normal  NECK: supple, non-tender LYMPH:  Left supraclavicular fullness LUNGS: clear to auscultation with normal breathing effort HEART: regular rate & rhythm and no murmurs and 1+ bilateral lower extremity edema ABDOMEN: soft, non-tender, non-distended, normal bowel sounds Musculoskeletal: no cyanosis of digits and no clubbing  PSYCH: alert & oriented x 3, fluent speech NEURO: no focal motor/sensory deficits  LABORATORY DATA:  I have reviewed the data as listed    Component Value Date/Time   NA 135 06/21/2019 1359   K 3.7 06/21/2019 1359   CL 102 06/21/2019 1359    CO2 24 06/21/2019 1359   GLUCOSE 153 (H) 06/21/2019 1359   BUN 23 (H) 06/21/2019 1359   CREATININE 0.82 06/21/2019 1359   CALCIUM 8.1 (L) 06/21/2019 1359   PROT 6.3 (L) 06/21/2019 1359   ALBUMIN 4.0 06/21/2019 1359   AST 35 06/21/2019 1359   ALT 108 (H) 06/21/2019 1359   ALKPHOS 69 06/21/2019 1359   BILITOT 0.6 06/21/2019 1359   GFRNONAA >60 06/21/2019 1359   GFRAA >60 06/21/2019 1359    No results found for: SPEP, UPEP  Lab Results  Component Value Date   WBC 1.1 (L) 06/21/2019   NEUTROABS 0.7 (L) 06/21/2019   HGB 9.9 (L) 06/21/2019   HCT 29.9 (L) 06/21/2019   MCV 88.7 06/21/2019   PLT 60 (L) 06/21/2019      Chemistry      Component Value Date/Time   NA 135 06/21/2019 1359   K 3.7 06/21/2019 1359   CL 102 06/21/2019 1359   CO2 24 06/21/2019 1359   BUN 23 (H) 06/21/2019 1359   CREATININE 0.82 06/21/2019 1359      Component Value Date/Time   CALCIUM 8.1 (L) 06/21/2019 1359   ALKPHOS 69 06/21/2019 1359   AST 35 06/21/2019 1359   ALT 108 (H) 06/21/2019 1359   BILITOT 0.6 06/21/2019 1359       RADIOGRAPHIC STUDIES: I have personally reviewed the radiological images as listed below and agreed with the findings in the report. Ct Soft Tissue Neck W Contrast  Result Date: 06/04/2019 CLINICAL DATA:  Initial evaluation for left-sided neck swelling at site of previous biopsy. EXAM: CT NECK WITH CONTRAST TECHNIQUE: Multidetector CT imaging of the neck was performed using the standard protocol following the bolus administration of intravenous contrast. CONTRAST:  66m OMNIPAQUE IOHEXOL 300 MG/ML  SOLN COMPARISON:  Prior PET-CT from 03/29/2019 as well as previous CT from 12/31/2018. FINDINGS: Pharynx and larynx: Oral cavity within normal limits without discrete mass or loculated collection. [place]atine tonsils within normal limits. Parapharyngeal fat maintained. Nasopharynx and oropharynx within normal limits. Epiglottis is normal. Vallecula clear. No retropharyngeal effusion.  Remainder of the hypopharynx and supraglottic larynx within normal limits. Glottis normal. Subglottic airway clear. Salivary glands: Salivary glands including the parotid and submandibular glands are normal. Thyroid: Thyroid normal. Lymph nodes: Postoperative changes from prior nodal dissection at the left neck/supraclavicular region again seen. Soft tissue density at the site of prior dissection measures 2.6 x 2.9 cm (series 3, image 64), worrisome for recurrent nodal disease. This is new relative to prior PET-CT from 03/29/2019. Few adjacent left supraclavicular nodes measure up to 1 cm (series 3, image 66). No other significant adenopathy within the  neck. Mildly prominent left level II a node measures up to 7 mm. Vascular: Normal intravascular enhancement seen throughout the neck. Mild atherosclerotic change about the carotid bifurcations. Right-sided Port-A-Cath in place. Limited intracranial: Unremarkable. Visualized orbits: Unremarkable. Mastoids and visualized paranasal sinuses: Mild scattered mucosal thickening noted within the ethmoidal air cells and maxillary sinuses. Paranasal sinuses are otherwise clear. Mastoid air cells and middle ear cavities are well pneumatized and free of fluid. Skeleton: No acute osseous abnormality. Increased sclerosis seen within several vertebral bodies of the cervicothoracic spine, most notable at C3 through C6, suspected to be treatment related. Mild multilevel cervical spondylolysis, most notable at C3-4 at C5-6. Upper chest: Visualized upper chest demonstrates no acute finding. Partially visualized lungs are clear. Other: None available. IMPRESSION: 1. 2.6 x 2.9 cm soft tissue density at the site of prior nodal dissection in the left neck/supraclavicular region, with few adjacent left supraclavicular nodes measuring up to 1 cm as above. Findings concerning for locally recurrent disease/lymphoma. Follow-up examination with dedicated PET-CT and/or histologic sampling  suggested for further evaluation. 2. No other acute abnormality within the neck. Electronically Signed   By: Jeannine Boga M.D.   On: 06/04/2019 16:04

## 2019-06-18 NOTE — Telephone Encounter (Signed)
Incoming fax from [facility] regarding pt lab parameters.  Pt will need CBCw Diff, CMP & Mg Mondays and CBC on Thursdays.  Transfusion required if Plt <20 w. 1 unit , Hgb 8.0 w/ 2units PRBC, Hgb <9 w/ 1 unit PRBC. ALL BLOOD PRODUCTS MUST be IRRADIATED.  Do not administer any growth factors ( Neupogen neulasta, epogen, Pocrit) without consulting [facility] MD's.  PT Received [facility] 06/18/2019    Pt will return to [facility] for labs, Dr.  Carolan Shiver and admission for cycle 2  Of R-ICE inpatient chemotherapy.  Lab orders entered.

## 2019-06-21 ENCOUNTER — Inpatient Hospital Stay (HOSPITAL_BASED_OUTPATIENT_CLINIC_OR_DEPARTMENT_OTHER): Payer: BC Managed Care – PPO | Admitting: Hematology

## 2019-06-21 ENCOUNTER — Other Ambulatory Visit: Payer: Self-pay

## 2019-06-21 ENCOUNTER — Telehealth: Payer: Self-pay | Admitting: Hematology

## 2019-06-21 ENCOUNTER — Inpatient Hospital Stay: Payer: BC Managed Care – PPO | Attending: Hematology

## 2019-06-21 ENCOUNTER — Inpatient Hospital Stay: Payer: BC Managed Care – PPO

## 2019-06-21 ENCOUNTER — Encounter: Payer: Self-pay | Admitting: Hematology

## 2019-06-21 VITALS — BP 114/79 | HR 109 | Resp 19 | Wt 198.0 lb

## 2019-06-21 DIAGNOSIS — C8338 Diffuse large B-cell lymphoma, lymph nodes of multiple sites: Secondary | ICD-10-CM | POA: Diagnosis present

## 2019-06-21 DIAGNOSIS — D6481 Anemia due to antineoplastic chemotherapy: Secondary | ICD-10-CM | POA: Diagnosis not present

## 2019-06-21 DIAGNOSIS — D6959 Other secondary thrombocytopenia: Secondary | ICD-10-CM

## 2019-06-21 DIAGNOSIS — T451X5A Adverse effect of antineoplastic and immunosuppressive drugs, initial encounter: Secondary | ICD-10-CM

## 2019-06-21 DIAGNOSIS — R945 Abnormal results of liver function studies: Secondary | ICD-10-CM

## 2019-06-21 DIAGNOSIS — Z5189 Encounter for other specified aftercare: Secondary | ICD-10-CM | POA: Insufficient documentation

## 2019-06-21 DIAGNOSIS — D701 Agranulocytosis secondary to cancer chemotherapy: Secondary | ICD-10-CM | POA: Diagnosis not present

## 2019-06-21 LAB — CMP (CANCER CENTER ONLY)
ALT: 108 U/L — ABNORMAL HIGH (ref 0–44)
AST: 35 U/L (ref 15–41)
Albumin: 4 g/dL (ref 3.5–5.0)
Alkaline Phosphatase: 69 U/L (ref 38–126)
Anion gap: 9 (ref 5–15)
BUN: 23 mg/dL — ABNORMAL HIGH (ref 6–20)
CO2: 24 mmol/L (ref 22–32)
Calcium: 8.1 mg/dL — ABNORMAL LOW (ref 8.9–10.3)
Chloride: 102 mmol/L (ref 98–111)
Creatinine: 0.82 mg/dL (ref 0.61–1.24)
GFR, Est AFR Am: >60 mL/min (ref >60)
GFR, Estimated: >60 mL/min (ref >60)
Glucose, Bld: 153 mg/dL — ABNORMAL HIGH (ref 70–99)
Potassium: 3.7 mmol/L (ref 3.5–5.1)
Sodium: 135 mmol/L (ref 135–145)
Total Bilirubin: 0.6 mg/dL (ref 0.3–1.2)
Total Protein: 6.3 g/dL — ABNORMAL LOW (ref 6.5–8.1)

## 2019-06-21 LAB — CBC WITH DIFFERENTIAL (CANCER CENTER ONLY)
Abs Immature Granulocytes: 0 10*3/uL (ref 0.00–0.07)
Basophils Absolute: 0 10*3/uL (ref 0.0–0.1)
Basophils Relative: 0 %
Eosinophils Absolute: 0 10*3/uL (ref 0.0–0.5)
Eosinophils Relative: 2 %
HCT: 29.9 % — ABNORMAL LOW (ref 39.0–52.0)
Hemoglobin: 9.9 g/dL — ABNORMAL LOW (ref 13.0–17.0)
Lymphocytes Relative: 28 %
Lymphs Abs: 0.3 10*3/uL — ABNORMAL LOW (ref 0.7–4.0)
MCH: 29.4 pg (ref 26.0–34.0)
MCHC: 33.1 g/dL (ref 30.0–36.0)
MCV: 88.7 fL (ref 80.0–100.0)
Monocytes Absolute: 0 10*3/uL — ABNORMAL LOW (ref 0.1–1.0)
Monocytes Relative: 2 %
Neutro Abs: 0.7 10*3/uL — ABNORMAL LOW (ref 1.7–7.7)
Neutrophils Relative %: 68 %
Platelet Count: 60 10*3/uL — ABNORMAL LOW (ref 150–400)
RBC: 3.37 MIL/uL — ABNORMAL LOW (ref 4.22–5.81)
RDW: 14.3 % (ref 11.5–15.5)
WBC Count: 1.1 10*3/uL — ABNORMAL LOW (ref 4.0–10.5)
nRBC: 0 % (ref 0.0–0.2)

## 2019-06-21 LAB — MAGNESIUM: Magnesium: 2.1 mg/dL (ref 1.7–2.4)

## 2019-06-21 MED ORDER — SODIUM CHLORIDE 0.9% FLUSH
10.0000 mL | Freq: Once | INTRAVENOUS | Status: AC | PRN
  Administered 2019-06-21: 14:00:00 10 mL
  Filled 2019-06-21: qty 10

## 2019-06-21 MED ORDER — HEPARIN SOD (PORK) LOCK FLUSH 100 UNIT/ML IV SOLN
500.0000 [IU] | Freq: Once | INTRAVENOUS | Status: AC
  Administered 2019-06-21: 500 [IU] via INTRAVENOUS
  Filled 2019-06-21: qty 5

## 2019-06-21 NOTE — Telephone Encounter (Signed)
No change in appts per 7/9 los

## 2019-06-21 NOTE — Patient Instructions (Signed)

## 2019-06-21 NOTE — Addendum Note (Signed)
Addended by: Amelia Jo I on: 06/21/2019 03:06 PM   Modules accepted: Orders

## 2019-06-22 ENCOUNTER — Inpatient Hospital Stay: Payer: BC Managed Care – PPO

## 2019-06-25 ENCOUNTER — Other Ambulatory Visit: Payer: Self-pay | Admitting: *Deleted

## 2019-06-25 ENCOUNTER — Other Ambulatory Visit: Payer: Self-pay | Admitting: Hematology

## 2019-06-25 ENCOUNTER — Other Ambulatory Visit: Payer: Self-pay | Admitting: Family

## 2019-06-25 ENCOUNTER — Other Ambulatory Visit: Payer: Self-pay

## 2019-06-25 ENCOUNTER — Inpatient Hospital Stay: Payer: BC Managed Care – PPO

## 2019-06-25 DIAGNOSIS — C8338 Diffuse large B-cell lymphoma, lymph nodes of multiple sites: Secondary | ICD-10-CM

## 2019-06-25 DIAGNOSIS — D6481 Anemia due to antineoplastic chemotherapy: Secondary | ICD-10-CM

## 2019-06-25 DIAGNOSIS — T451X5A Adverse effect of antineoplastic and immunosuppressive drugs, initial encounter: Secondary | ICD-10-CM

## 2019-06-25 DIAGNOSIS — D696 Thrombocytopenia, unspecified: Secondary | ICD-10-CM

## 2019-06-25 LAB — CBC WITH DIFFERENTIAL (CANCER CENTER ONLY)
Abs Immature Granulocytes: 0.26 10*3/uL — ABNORMAL HIGH (ref 0.00–0.07)
Basophils Absolute: 0 10*3/uL (ref 0.0–0.1)
Basophils Relative: 1 %
Eosinophils Absolute: 0 10*3/uL (ref 0.0–0.5)
Eosinophils Relative: 0 %
HCT: 23.9 % — ABNORMAL LOW (ref 39.0–52.0)
Hemoglobin: 7.9 g/dL — ABNORMAL LOW (ref 13.0–17.0)
Immature Granulocytes: 8 %
Lymphocytes Relative: 21 %
Lymphs Abs: 0.7 10*3/uL (ref 0.7–4.0)
MCH: 29.2 pg (ref 26.0–34.0)
MCHC: 33.1 g/dL (ref 30.0–36.0)
MCV: 88.2 fL (ref 80.0–100.0)
Monocytes Absolute: 0.4 10*3/uL (ref 0.1–1.0)
Monocytes Relative: 13 %
Neutro Abs: 1.8 10*3/uL (ref 1.7–7.7)
Neutrophils Relative %: 57 %
Platelet Count: 11 10*3/uL — ABNORMAL LOW (ref 150–400)
RBC: 2.71 MIL/uL — ABNORMAL LOW (ref 4.22–5.81)
RDW: 13.7 % (ref 11.5–15.5)
WBC Count: 3.2 10*3/uL — ABNORMAL LOW (ref 4.0–10.5)
nRBC: 1.9 % — ABNORMAL HIGH (ref 0.0–0.2)

## 2019-06-25 LAB — CMP (CANCER CENTER ONLY)
ALT: 47 U/L — ABNORMAL HIGH (ref 0–44)
AST: 16 U/L (ref 15–41)
Albumin: 4 g/dL (ref 3.5–5.0)
Alkaline Phosphatase: 85 U/L (ref 38–126)
Anion gap: 11 (ref 5–15)
BUN: 20 mg/dL (ref 6–20)
CO2: 27 mmol/L (ref 22–32)
Calcium: 8.2 mg/dL — ABNORMAL LOW (ref 8.9–10.3)
Chloride: 101 mmol/L (ref 98–111)
Creatinine: 0.77 mg/dL (ref 0.61–1.24)
GFR, Est AFR Am: >60 mL/min (ref >60)
GFR, Estimated: >60 mL/min (ref >60)
Glucose, Bld: 119 mg/dL — ABNORMAL HIGH (ref 70–99)
Potassium: 3.4 mmol/L — ABNORMAL LOW (ref 3.5–5.1)
Sodium: 139 mmol/L (ref 135–145)
Total Bilirubin: 0.6 mg/dL (ref 0.3–1.2)
Total Protein: 6.1 g/dL — ABNORMAL LOW (ref 6.5–8.1)

## 2019-06-25 LAB — SAMPLE TO BLOOD BANK

## 2019-06-25 LAB — PREPARE RBC (CROSSMATCH)

## 2019-06-25 LAB — ABO/RH: ABO/RH(D): A POS

## 2019-06-25 LAB — MAGNESIUM: Magnesium: 2 mg/dL (ref 1.7–2.4)

## 2019-06-25 NOTE — Patient Instructions (Signed)

## 2019-06-26 ENCOUNTER — Other Ambulatory Visit: Payer: Self-pay | Admitting: *Deleted

## 2019-06-26 ENCOUNTER — Inpatient Hospital Stay: Payer: BC Managed Care – PPO

## 2019-06-26 VITALS — BP 121/77 | HR 87 | Temp 97.8°F | Resp 18

## 2019-06-26 DIAGNOSIS — D696 Thrombocytopenia, unspecified: Secondary | ICD-10-CM

## 2019-06-26 DIAGNOSIS — C8338 Diffuse large B-cell lymphoma, lymph nodes of multiple sites: Secondary | ICD-10-CM

## 2019-06-26 DIAGNOSIS — D6481 Anemia due to antineoplastic chemotherapy: Secondary | ICD-10-CM

## 2019-06-26 MED ORDER — HEPARIN SOD (PORK) LOCK FLUSH 100 UNIT/ML IV SOLN
500.0000 [IU] | Freq: Once | INTRAVENOUS | Status: AC | PRN
  Administered 2019-06-26: 500 [IU]
  Filled 2019-06-26: qty 5

## 2019-06-26 MED ORDER — FLUCONAZOLE 100 MG PO TABS: 100.0000 mg | ORAL_TABLET | Freq: Every day | ORAL | 3 refills | Status: AC

## 2019-06-26 MED ORDER — DIPHENHYDRAMINE HCL 25 MG PO CAPS
ORAL_CAPSULE | ORAL | Status: AC
  Filled 2019-06-26: qty 1

## 2019-06-26 MED ORDER — ACETAMINOPHEN 325 MG PO TABS
ORAL_TABLET | ORAL | Status: AC
  Filled 2019-06-26: qty 2

## 2019-06-26 MED ORDER — ACETAMINOPHEN 325 MG PO TABS
650.0000 mg | ORAL_TABLET | Freq: Once | ORAL | Status: AC
  Administered 2019-06-26: 650 mg via ORAL

## 2019-06-26 MED ORDER — DIPHENHYDRAMINE HCL 25 MG PO CAPS
25.0000 mg | ORAL_CAPSULE | Freq: Once | ORAL | Status: AC
  Administered 2019-06-26: 25 mg via ORAL

## 2019-06-26 MED ORDER — FUROSEMIDE 10 MG/ML IJ SOLN: 20.0000 mg | Freq: Once | INTRAMUSCULAR | Status: DC

## 2019-06-26 MED ORDER — SODIUM CHLORIDE 0.9% IV SOLUTION
250.0000 mL | Freq: Once | INTRAVENOUS | Status: AC
  Administered 2019-06-26: 250 mL via INTRAVENOUS
  Filled 2019-06-26: qty 250

## 2019-06-26 MED ORDER — HEPARIN SOD (PORK) LOCK FLUSH 100 UNIT/ML IV SOLN
250.0000 [IU] | INTRAVENOUS | Status: DC | PRN
  Filled 2019-06-26: qty 5

## 2019-06-26 MED ORDER — SODIUM CHLORIDE 0.9% FLUSH
10.0000 mL | INTRAVENOUS | Status: AC | PRN
  Administered 2019-06-26: 10 mL
  Filled 2019-06-26: qty 10

## 2019-06-26 NOTE — Patient Instructions (Signed)
Complete Blood Count Why am I having this test? A complete blood count (CBC) is a blood test that measures the cells of your blood. The types of cells are red blood cells (RBCs), white blood cells (WBCs), and blood cell fragments that help with clotting (platelets). Changes in these cells can warn your health care provider about many conditions, including anemia, infection, inflammation, bleeding, and blood-related cancer. You may have this test:  As part of a routine physical exam.  To help your health care provider diagnose certain health conditions.  To help your health care provider monitor known health conditions or treatments. What is being tested? A CBC measures your RBCs, WBCs, platelets, and their different components. RBCs are made in the tissue inside your bones (bone marrow) and released into your blood. These cells contain a protein that carries oxygen in your blood (hemoglobin). CBC testing of RBCs includes:  RBC count. This is the total number of RBCs.  Hemoglobin (Hb). This is the total amount of hemoglobin.  Hematocrit (Hct). This is the percentage of space that red blood cells take up in your blood.  Mean corpuscular volume (MCV). This is the average size of red blood cells.  Mean corpuscular hemoglobin [facility]). This is the average amount of hemoglobin inside each red blood cell.  Mean corpuscular hemoglobin concentration (MCHC). This is the average concentration of hemoglobin inside each red blood cell.  Red blood cell distribution width (RDW). This may be included to measure the variation in the size of red blood cells. WBCs are also made in your bone marrow. They are part of your body's disease-fighting system (immune system). CBC testing of WBCs includes:  WBC count. This is the total number of WBCs.  A count of the five types of WBCs: ? Neutrophils. ? Lymphocytes. ? Monocytes. ? Eosinophils. ? Basophils. Platelets are cell fragments that are important for  blood clotting. A CBC measures:  Total platelet count.  Mean platelet volume (MPV). What kind of sample is taken?  A blood sample is required for this test. It is usually collected by inserting a needle into a blood vessel. How are the results reported? Your test results will be reported as values. Your health care provider will compare your results to normal ranges that were established after testing a large group of people (reference ranges). Reference ranges may vary among labs and hospitals. Reference ranges for a CBC usually apply only to people who are older than 18. For this test, common reference ranges for people older than 18 may be: Red blood cells  RBC count ? Men: 4.7-6.1 ? Women: 4.2-5.4  Hb ? Men: 30-18 ? Women: 12-16  Hct ? Men: 42-52% ? Women: 37-47%  MCV: 80-95  MCH: 27-31  MCHC: 32-36  RDW: 11-14.5% White blood cells  WBC count: 5,000-10,000  Neutrophil count: 2500-8000 or 55-70%  Lymphocyte count: 1000-4000 or 20-40%  Monocyte count: 100-700 or 2-8%  Eosinophil count: 50-500 or 1-4%  Basophil count: 25-100 or 0.5-1% Platelets  Platelet count: 150,000-400,000  MPV: 7.4-10.4 What do the results mean? Results that are outside the reference ranges may indicate that you have an infection or other health condition. Red blood cells  RBC count, Hb, and Hct ? Results lower than the reference ranges may indicate anemia. Anemia may be caused by several conditions, such as iron deficiency anemia. ? Results higher than the reference ranges may indicate polycythemia. This may be caused by several conditions, such as chronic obstructive pulmonary disease (COPD).  MCV, MCH, MCHC, and RDW ? Results outside the reference ranges may indicate a condition that causes anemia. White blood cells  WBC count ? Results lower than the reference range may indicate an infection or a condition that keeps your bone marrow from making new WBCs. ? Results higher than  the reference range may indicate an infection, a condition that causes inflammation (autoimmune disease), or a blood-related cancer.  Neutrophils, lymphocytes, and monocytes ? Results outside the reference ranges may indicate an infection, an autoimmune disease, or certain types of cancer.  Eosinophils and basophils ? Results outside the reference ranges may indicate an allergy, an inflammatory condition such as asthma, or blood cell cancer. Platelets  Platelet count ? Results lower than the reference range may indicate an infection or blood cell cancer. ? Results higher than the reference range may indicate certain types of anemia, an autoimmune disease, or certain cancers.  MPV ? High or low results may indicate a bone marrow abnormality. Talk with your health care provider about what your results mean. Questions to ask your health care provider Ask your health care provider or the department that is doing the test:  When will my results be ready?  How will I get my results?  What are my treatment options?  What other tests do I need?  What are my next steps? Summary  A CBC is a routine blood test that measures the cells in your blood.  Changes in these cells can indicate many conditions, including anemia, infection, inflammation, and blood cell cancer.  A health care provider will collect a sample of your blood for this test by inserting a needle into a blood vessel.  Talk with your health care provider about what your results mean. This information is not intended to replace advice given to you by your health care provider. Make sure you discuss any questions you have with your health care provider. Document Released: 01/01/2005 Document Revised: 11/29/2017 Document Reviewed: 11/29/2017 Elsevier Patient Education  2020 [place]. Blood Transfusion, Adult  A blood transfusion is a procedure in which you receive donated blood, including plasma, platelets, and red blood  cells, through an IV tube. You may need a blood transfusion because of illness, surgery, or injury. The blood may come from a donor. You may also be able to donate blood for yourself (autologous blood donation) before a surgery if you know that you might require a blood transfusion. The blood given in a transfusion is made up of different types of cells. You may receive:  Red blood cells. These carry oxygen to the cells in the body.  White blood cells. These help you fight infections.  Platelets. These help your blood to clot.  Plasma. This is the liquid part of your blood and it helps with fluid imbalances. If you have hemophilia or another clotting disorder, you may also receive other types of blood products. Tell a health care provider about:  Any allergies you have.  All medicines you are taking, including vitamins, herbs, eye drops, creams, and over-the-counter medicines.  Any problems you or family members have had with anesthetic medicines.  Any blood disorders you have.  Any surgeries you have had.  Any medical conditions you have, including any recent fever or cold symptoms.  Whether you are pregnant or may be pregnant.  Any previous reactions you have had during a blood transfusion. What are the risks? Generally, this is a safe procedure. However, problems may occur, including:  Having an allergic  reaction to something in the donated blood. Hives and itching may be symptoms of this type of reaction.  Fever. This may be a reaction to the white blood cells in the transfused blood. Nausea or chest pain may accompany a fever.  Iron overload. This can happen from having many transfusions.  Transfusion-related acute lung injury (TRALI). This is a rare reaction that causes lung damage. The cause is not known.TRALI can occur within hours of a transfusion or several days later.  Sudden (acute) or delayed hemolytic reactions. This happens if your blood does not match the cells  in your transfusion. Your body's defense system (immune system) may try to attack the new cells. This complication is rare. The symptoms include fever, chills, nausea, and low back pain or chest pain.  Infection or disease transmission. This is rare. What happens before the procedure?  You will have a blood test to determine your blood type. This is necessary to know what kind of blood your body will accept and to match it to the donor blood.  If you are going to have a planned surgery, you may be able to do an autologous blood donation. This may be done in case you need to have a transfusion.  If you have had an allergic reaction to a transfusion in the past, you may be given medicine to help prevent a reaction. This medicine may be given to you by mouth or through an IV tube.  You will have your temperature, blood pressure, and pulse monitored before the transfusion.  Follow instructions from your health care provider about eating and drinking restrictions.  Ask your health care provider about: ? Changing or stopping your regular medicines. This is especially important if you are taking diabetes medicines or blood thinners. ? Taking medicines such as aspirin and ibuprofen. These medicines can thin your blood. Do not take these medicines before your procedure if your health care provider instructs you not to. What happens during the procedure?  An IV tube will be inserted into one of your veins.  The bag of donated blood will be attached to your IV tube. The blood will then enter through your vein.  Your temperature, blood pressure, and pulse will be monitored regularly during the transfusion. This monitoring is done to detect early signs of a transfusion reaction.  If you have any signs or symptoms of a reaction, your transfusion will be stopped and you may be given medicine.  When the transfusion is complete, your IV tube will be removed.  Pressure may be applied to the IV site for a  few minutes.  A bandage (dressing) will be applied. The procedure may vary among health care providers and hospitals. What happens after the procedure?  Your temperature, blood pressure, heart rate, breathing rate, and blood oxygen level will be monitored often.  Your blood may be tested to see how you are responding to the transfusion.  You may be warmed with fluids or blankets to maintain a normal body temperature. Summary  A blood transfusion is a procedure in which you receive donated blood, including plasma, platelets, and red blood cells, through an IV tube.  Your temperature, blood pressure, and pulse will be monitored before, during, and after the transfusion.  Your blood may be tested after the transfusion to see how your body has responded. This information is not intended to replace advice given to you by your health care provider. Make sure you discuss any questions you have with your health  care provider. Document Released: 11/26/2000 Document Revised: 10/16/2018 Document Reviewed: 08/26/2016 Elsevier Patient Education  2020 [employer].

## 2019-06-27 ENCOUNTER — Other Ambulatory Visit: Payer: Self-pay | Admitting: Hematology

## 2019-06-27 ENCOUNTER — Encounter: Payer: Self-pay | Admitting: *Deleted

## 2019-06-27 LAB — BPAM RBC
Blood Product Expiration Date: 202008072359
Blood Product Expiration Date: 202008072359
ISSUE DATE / TIME: 202007140739
ISSUE DATE / TIME: 202007140739
Unit Type and Rh: 6200
Unit Type and Rh: 6200

## 2019-06-27 LAB — TYPE AND SCREEN
ABO/RH(D): A POS
Antibody Screen: NEGATIVE
Unit division: 0
Unit division: 0

## 2019-06-27 LAB — PREPARE PLATELET PHERESIS: Unit division: 0

## 2019-06-27 LAB — BPAM PLATELET PHERESIS
Blood Product Expiration Date: 202007152359
ISSUE DATE / TIME: 202007140738
Unit Type and Rh: 7300

## 2019-06-28 ENCOUNTER — Inpatient Hospital Stay: Payer: BC Managed Care – PPO

## 2019-06-28 ENCOUNTER — Other Ambulatory Visit: Payer: Self-pay

## 2019-06-28 VITALS — BP 125/79 | HR 85 | Temp 97.8°F

## 2019-06-28 DIAGNOSIS — C8338 Diffuse large B-cell lymphoma, lymph nodes of multiple sites: Secondary | ICD-10-CM

## 2019-06-28 LAB — CMP (CANCER CENTER ONLY)
ALT: 36 U/L (ref 0–44)
AST: 21 U/L (ref 15–41)
Albumin: 4 g/dL (ref 3.5–5.0)
Alkaline Phosphatase: 109 U/L (ref 38–126)
Anion gap: 7 (ref 5–15)
BUN: 15 mg/dL (ref 6–20)
CO2: 30 mmol/L (ref 22–32)
Calcium: 8.2 mg/dL — ABNORMAL LOW (ref 8.9–10.3)
Chloride: 105 mmol/L (ref 98–111)
Creatinine: 0.77 mg/dL (ref 0.61–1.24)
GFR, Est AFR Am: >60 mL/min (ref >60)
GFR, Estimated: >60 mL/min (ref >60)
Glucose, Bld: 96 mg/dL (ref 70–99)
Potassium: 3.9 mmol/L (ref 3.5–5.1)
Sodium: 142 mmol/L (ref 135–145)
Total Bilirubin: 0.4 mg/dL (ref 0.3–1.2)
Total Protein: 5.8 g/dL — ABNORMAL LOW (ref 6.5–8.1)

## 2019-06-28 LAB — CBC WITH DIFFERENTIAL (CANCER CENTER ONLY)
Abs Immature Granulocytes: 1.19 10*3/uL — ABNORMAL HIGH (ref 0.00–0.07)
Basophils Absolute: 0 10*3/uL (ref 0.0–0.1)
Basophils Relative: 0 %
Eosinophils Absolute: 0 10*3/uL (ref 0.0–0.5)
Eosinophils Relative: 0 %
HCT: 29.7 % — ABNORMAL LOW (ref 39.0–52.0)
Hemoglobin: 9.8 g/dL — ABNORMAL LOW (ref 13.0–17.0)
Immature Granulocytes: 15 %
Lymphocytes Relative: 9 %
Lymphs Abs: 0.7 10*3/uL (ref 0.7–4.0)
MCH: 29.2 pg (ref 26.0–34.0)
MCHC: 33 g/dL (ref 30.0–36.0)
MCV: 88.4 fL (ref 80.0–100.0)
Monocytes Absolute: 0.8 10*3/uL (ref 0.1–1.0)
Monocytes Relative: 11 %
Neutro Abs: 5 10*3/uL (ref 1.7–7.7)
Neutrophils Relative %: 65 %
Platelet Count: 30 10*3/uL — ABNORMAL LOW (ref 150–400)
RBC: 3.36 MIL/uL — ABNORMAL LOW (ref 4.22–5.81)
RDW: 14.6 % (ref 11.5–15.5)
WBC Count: 7.7 10*3/uL (ref 4.0–10.5)
nRBC: 0.3 % — ABNORMAL HIGH (ref 0.0–0.2)

## 2019-06-28 LAB — MAGNESIUM: Magnesium: 2 mg/dL (ref 1.7–2.4)

## 2019-06-28 MED ORDER — HEPARIN SOD (PORK) LOCK FLUSH 100 UNIT/ML IV SOLN
500.0000 [IU] | Freq: Once | INTRAVENOUS | Status: AC
  Administered 2019-06-28: 500 [IU] via INTRAVENOUS
  Filled 2019-06-28: qty 5

## 2019-06-28 MED ORDER — SODIUM CHLORIDE 0.9% FLUSH
10.0000 mL | INTRAVENOUS | Status: DC | PRN
  Administered 2019-06-28: 10 mL via INTRAVENOUS
  Filled 2019-06-28: qty 10

## 2019-06-28 NOTE — Patient Instructions (Signed)

## 2019-06-29 ENCOUNTER — Inpatient Hospital Stay: Payer: BC Managed Care – PPO

## 2019-07-02 ENCOUNTER — Inpatient Hospital Stay: Payer: BC Managed Care – PPO

## 2019-07-02 ENCOUNTER — Other Ambulatory Visit: Payer: Self-pay

## 2019-07-02 VITALS — BP 135/92 | HR 104 | Temp 98.1°F

## 2019-07-02 DIAGNOSIS — C8338 Diffuse large B-cell lymphoma, lymph nodes of multiple sites: Secondary | ICD-10-CM | POA: Diagnosis not present

## 2019-07-02 DIAGNOSIS — Z95828 Presence of other vascular implants and grafts: Secondary | ICD-10-CM

## 2019-07-02 LAB — CMP (CANCER CENTER ONLY)
ALT: 36 U/L (ref 0–44)
AST: 40 U/L (ref 15–41)
Albumin: 4.3 g/dL (ref 3.5–5.0)
Alkaline Phosphatase: 111 U/L (ref 38–126)
Anion gap: 10 (ref 5–15)
BUN: 16 mg/dL (ref 6–20)
CO2: 28 mmol/L (ref 22–32)
Calcium: 8.6 mg/dL — ABNORMAL LOW (ref 8.9–10.3)
Chloride: 101 mmol/L (ref 98–111)
Creatinine: 0.97 mg/dL (ref 0.61–1.24)
GFR, Est AFR Am: >60 mL/min (ref >60)
GFR, Estimated: >60 mL/min (ref >60)
Glucose, Bld: 206 mg/dL — ABNORMAL HIGH (ref 70–99)
Potassium: 3.7 mmol/L (ref 3.5–5.1)
Sodium: 139 mmol/L (ref 135–145)
Total Bilirubin: 0.3 mg/dL (ref 0.3–1.2)
Total Protein: 6.3 g/dL — ABNORMAL LOW (ref 6.5–8.1)

## 2019-07-02 LAB — CBC WITH DIFFERENTIAL (CANCER CENTER ONLY)
Abs Immature Granulocytes: 1.61 10*3/uL — ABNORMAL HIGH (ref 0.00–0.07)
Basophils Absolute: 0.1 10*3/uL (ref 0.0–0.1)
Basophils Relative: 0 %
Eosinophils Absolute: 0 10*3/uL (ref 0.0–0.5)
Eosinophils Relative: 0 %
HCT: 31.8 % — ABNORMAL LOW (ref 39.0–52.0)
Hemoglobin: 10.2 g/dL — ABNORMAL LOW (ref 13.0–17.0)
Immature Granulocytes: 10 %
Lymphocytes Relative: 7 %
Lymphs Abs: 1.1 10*3/uL (ref 0.7–4.0)
MCH: 29 pg (ref 26.0–34.0)
MCHC: 32.1 g/dL (ref 30.0–36.0)
MCV: 90.3 fL (ref 80.0–100.0)
Monocytes Absolute: 1.2 10*3/uL — ABNORMAL HIGH (ref 0.1–1.0)
Monocytes Relative: 8 %
Neutro Abs: 11.7 10*3/uL — ABNORMAL HIGH (ref 1.7–7.7)
Neutrophils Relative %: 75 %
Platelet Count: 91 10*3/uL — ABNORMAL LOW (ref 150–400)
RBC: 3.52 MIL/uL — ABNORMAL LOW (ref 4.22–5.81)
RDW: 15.8 % — ABNORMAL HIGH (ref 11.5–15.5)
WBC Count: 15.6 10*3/uL — ABNORMAL HIGH (ref 4.0–10.5)
nRBC: 0.3 % — ABNORMAL HIGH (ref 0.0–0.2)

## 2019-07-02 LAB — MAGNESIUM: Magnesium: 2.2 mg/dL (ref 1.7–2.4)

## 2019-07-02 MED ORDER — SODIUM CHLORIDE 0.9% FLUSH
10.0000 mL | INTRAVENOUS | Status: DC | PRN
  Administered 2019-07-02: 14:00:00 10 mL via INTRAVENOUS
  Filled 2019-07-02: qty 10

## 2019-07-02 MED ORDER — HEPARIN SOD (PORK) LOCK FLUSH 100 UNIT/ML IV SOLN
500.0000 [IU] | Freq: Once | INTRAVENOUS | Status: AC
  Administered 2019-07-02: 14:00:00 500 [IU] via INTRAVENOUS
  Filled 2019-07-02: qty 5

## 2019-07-02 NOTE — Patient Instructions (Signed)
Implanted Port Insertion, Care After This sheet gives you information about how to care for yourself after your procedure. Your health care provider may also give you more specific instructions. If you have problems or questions, contact your health care provider. What can I expect after the procedure? After the procedure, it is common to have:  Discomfort at the port insertion site.  Bruising on the skin over the port. This should improve over 3-4 days. Follow these instructions at home: Port care  After your port is placed, you will get a manufacturer's information card. The card has information about your port. Keep this card with you at all times.  Take care of the port as told by your health care provider. Ask your health care provider if you or a family member can get training for taking care of the port at home. A home health care nurse may also take care of the port.  Make sure to remember what type of port you have. Incision care      Follow instructions from your health care provider about how to take care of your port insertion site. Make sure you: ? Wash your hands with soap and water before and after you change your bandage (dressing). If soap and water are not available, use hand sanitizer. ? Change your dressing as told by your health care provider. ? Leave stitches (sutures), skin glue, or adhesive strips in place. These skin closures may need to stay in place for 2 weeks or longer. If adhesive strip edges start to loosen and curl up, you may trim the loose edges. Do not remove adhesive strips completely unless your health care provider tells you to do that.  Check your port insertion site every day for signs of infection. Check for: ? Redness, swelling, or pain. ? Fluid or blood. ? Warmth. ? Pus or a bad smell. Activity  Return to your normal activities as told by your health care provider. Ask your health care provider what activities are safe for you.  Do not  lift anything that is heavier than 10 lb (4.5 kg), or the limit that you are told, until your health care provider says that it is safe. General instructions  Take over-the-counter and prescription medicines only as told by your health care provider.  Do not take baths, swim, or use a hot tub until your health care provider approves. Ask your health care provider if you may take showers. You may only be allowed to take sponge baths.  Do not drive for 24 hours if you were given a sedative during your procedure.  Wear a medical alert bracelet in case of an emergency. This will tell any health care providers that you have a port.  Keep all follow-up visits as told by your health care provider. This is important. Contact a health care provider if:  You cannot flush your port with saline as directed, or you cannot draw blood from the port.  You have a fever or chills.  You have redness, swelling, or pain around your port insertion site.  You have fluid or blood coming from your port insertion site.  Your port insertion site feels warm to the touch.  You have pus or a bad smell coming from the port insertion site. Get help right away if:  You have chest pain or shortness of breath.  You have bleeding from your port that you cannot control. Summary  Take care of the port as told by your health   care provider. Keep the manufacturer's information card with you at all times.  Change your dressing as told by your health care provider.  Contact a health care provider if you have a fever or chills or if you have redness, swelling, or pain around your port insertion site.  Keep all follow-up visits as told by your health care provider. This information is not intended to replace advice given to you by your health care provider. Make sure you discuss any questions you have with your health care provider. Document Released: 09/19/2013 Document Revised: 06/27/2018 Document Reviewed: 06/27/2018  Elsevier Patient Education  2020 [employer]  

## 2019-07-03 ENCOUNTER — Other Ambulatory Visit: Payer: Self-pay | Admitting: Hematology

## 2019-07-03 ENCOUNTER — Telehealth: Payer: Self-pay | Admitting: Hematology

## 2019-07-03 ENCOUNTER — Inpatient Hospital Stay: Payer: BC Managed Care – PPO

## 2019-07-03 NOTE — Telephone Encounter (Signed)
Called and spoke with patient regarding appointments added per 7/21 staff message

## 2019-07-06 ENCOUNTER — Other Ambulatory Visit: Payer: Self-pay | Admitting: Hematology

## 2019-07-06 DIAGNOSIS — C8338 Diffuse large B-cell lymphoma, lymph nodes of multiple sites: Secondary | ICD-10-CM

## 2019-07-06 MED ORDER — HCA TUSSIN 100 MG/5ML PO SYRP
15.00 | ORAL_SOLUTION | ORAL | Status: DC
Start: ? — End: 2019-07-06

## 2019-07-06 MED ORDER — SAFE-T-SPORT KNEE SLEEVE SMALL MISC
0.30 | Status: DC
Start: ? — End: 2019-07-06

## 2019-07-06 MED ORDER — SG DIBROMM 2-12.5 MG/5ML OR ELIX
40.00 | ORAL_SOLUTION | ORAL | Status: DC
Start: 2019-07-08 — End: 2019-07-06

## 2019-07-06 MED ORDER — Medication
1.00 | Status: DC
Start: 2019-07-07 — End: 2019-07-06

## 2019-07-06 MED ORDER — Medication
8.00 | Status: DC
Start: 2019-07-07 — End: 2019-07-06

## 2019-07-06 MED ORDER — BARO-CAT PO
125.00 | ORAL | Status: DC
Start: ? — End: 2019-07-06

## 2019-07-06 MED ORDER — NUTRINATE PO CHEW
1.00 | CHEWABLE_TABLET | ORAL | Status: DC
Start: ? — End: 2019-07-06

## 2019-07-06 MED ORDER — CVS POLY BACITRACIN 500-10000 UNIT/GM EX OINT
10.00 | TOPICAL_OINTMENT | CUTANEOUS | Status: DC
Start: 2019-07-07 — End: 2019-07-06

## 2019-07-06 MED ORDER — AMMONUL 10-10 % IV SOLN
10.00 | INTRAVENOUS | Status: DC
Start: ? — End: 2019-07-06

## 2019-07-06 MED ORDER — PCCA BIOPEPTIDE BASE EX CREA
600.00 | TOPICAL_CREAM | CUTANEOUS | Status: DC
Start: 2019-07-07 — End: 2019-07-06

## 2019-07-06 MED ORDER — DAILY VITAMINS/IRON PO
5.00 | ORAL | Status: DC
Start: ? — End: 2019-07-06

## 2019-07-06 MED ORDER — POLYETHYLENE GLYCOL 3350 17 G PO PACK
17.00 | PACK | ORAL | Status: DC
Start: 2019-07-08 — End: 2019-07-06

## 2019-07-06 MED ORDER — DOBUTAMINE IN D5W IV
300.00 | INTRAVENOUS | Status: DC
Start: 2019-07-08 — End: 2019-07-06

## 2019-07-06 MED ORDER — RENAGEL PO
2.00 | ORAL | Status: DC
Start: 2019-07-06 — End: 2019-07-06

## 2019-07-06 MED ORDER — METHYLPHENIDATE HCL POWD
50.00 | Status: DC
Start: ? — End: 2019-07-06

## 2019-07-06 MED ORDER — BARO-CAT PO
ORAL | Status: DC
Start: ? — End: 2019-07-06

## 2019-07-06 MED ORDER — MELLARIL 150 MG OR TABS
125.00 | ORAL_TABLET | ORAL | Status: DC
Start: ? — End: 2019-07-06

## 2019-07-06 MED ORDER — Medication
20.00 | Status: DC
Start: 2019-07-07 — End: 2019-07-06

## 2019-07-06 MED ORDER — CVS EAR DROPS OT
40.00 | OTIC | Status: DC
Start: 2019-07-08 — End: 2019-07-06

## 2019-07-06 MED ORDER — SB BRONCHIAL 12.5-200 MG PO TABS
25.00 | ORAL_TABLET | ORAL | Status: DC
Start: ? — End: 2019-07-06

## 2019-07-06 MED ORDER — FP GLUCOSE PO
2.00 | ORAL | Status: DC
Start: 2019-07-06 — End: 2019-07-06

## 2019-07-06 MED ORDER — Medication
20.00 | Status: DC
Start: ? — End: 2019-07-06

## 2019-07-07 ENCOUNTER — Encounter: Payer: Self-pay | Admitting: Hematology

## 2019-07-09 ENCOUNTER — Inpatient Hospital Stay: Payer: BC Managed Care – PPO

## 2019-07-09 ENCOUNTER — Telehealth: Payer: Self-pay | Admitting: Hematology

## 2019-07-09 ENCOUNTER — Inpatient Hospital Stay (HOSPITAL_BASED_OUTPATIENT_CLINIC_OR_DEPARTMENT_OTHER): Payer: BC Managed Care – PPO | Admitting: Hematology

## 2019-07-09 ENCOUNTER — Other Ambulatory Visit: Payer: Self-pay

## 2019-07-09 ENCOUNTER — Encounter: Payer: Self-pay | Admitting: Hematology

## 2019-07-09 VITALS — BP 129/98 | HR 64 | Temp 98.4°F | Resp 18 | Ht 69.0 in | Wt 200.1 lb

## 2019-07-09 DIAGNOSIS — D6481 Anemia due to antineoplastic chemotherapy: Secondary | ICD-10-CM | POA: Diagnosis not present

## 2019-07-09 DIAGNOSIS — D6959 Other secondary thrombocytopenia: Secondary | ICD-10-CM

## 2019-07-09 DIAGNOSIS — C8338 Diffuse large B-cell lymphoma, lymph nodes of multiple sites: Secondary | ICD-10-CM

## 2019-07-09 DIAGNOSIS — R110 Nausea: Secondary | ICD-10-CM

## 2019-07-09 DIAGNOSIS — K219 Gastro-esophageal reflux disease without esophagitis: Secondary | ICD-10-CM

## 2019-07-09 DIAGNOSIS — D701 Agranulocytosis secondary to cancer chemotherapy: Secondary | ICD-10-CM

## 2019-07-09 DIAGNOSIS — T451X5A Adverse effect of antineoplastic and immunosuppressive drugs, initial encounter: Secondary | ICD-10-CM

## 2019-07-09 LAB — CBC WITH DIFFERENTIAL (CANCER CENTER ONLY)
Abs Immature Granulocytes: 0.06 10*3/uL (ref 0.00–0.07)
Basophils Absolute: 0 10*3/uL (ref 0.0–0.1)
Basophils Relative: 0 %
Eosinophils Absolute: 0 10*3/uL (ref 0.0–0.5)
Eosinophils Relative: 0 %
HCT: 29.3 % — ABNORMAL LOW (ref 39.0–52.0)
Hemoglobin: 9.4 g/dL — ABNORMAL LOW (ref 13.0–17.0)
Immature Granulocytes: 2 %
Lymphocytes Relative: 7 %
Lymphs Abs: 0.2 10*3/uL — ABNORMAL LOW (ref 0.7–4.0)
MCH: 28.4 pg (ref 26.0–34.0)
MCHC: 32.1 g/dL (ref 30.0–36.0)
MCV: 88.5 fL (ref 80.0–100.0)
Monocytes Absolute: 0.2 10*3/uL (ref 0.1–1.0)
Monocytes Relative: 6 %
Neutro Abs: 3 10*3/uL (ref 1.7–7.7)
Neutrophils Relative %: 85 %
Platelet Count: 109 10*3/uL — ABNORMAL LOW (ref 150–400)
RBC: 3.31 MIL/uL — ABNORMAL LOW (ref 4.22–5.81)
RDW: 15 % (ref 11.5–15.5)
WBC Count: 3.5 10*3/uL — ABNORMAL LOW (ref 4.0–10.5)
nRBC: 0 % (ref 0.0–0.2)

## 2019-07-09 LAB — CMP (CANCER CENTER ONLY)
ALT: 59 U/L — ABNORMAL HIGH (ref 0–44)
AST: 41 U/L (ref 15–41)
Albumin: 4 g/dL (ref 3.5–5.0)
Alkaline Phosphatase: 57 U/L (ref 38–126)
Anion gap: 9 (ref 5–15)
BUN: 22 mg/dL — ABNORMAL HIGH (ref 6–20)
CO2: 28 mmol/L (ref 22–32)
Calcium: 8.5 mg/dL — ABNORMAL LOW (ref 8.9–10.3)
Chloride: 101 mmol/L (ref 98–111)
Creatinine: 0.76 mg/dL (ref 0.61–1.24)
GFR, Est AFR Am: >60 mL/min (ref >60)
GFR, Estimated: >60 mL/min (ref >60)
Glucose, Bld: 105 mg/dL — ABNORMAL HIGH (ref 70–99)
Potassium: 3.9 mmol/L (ref 3.5–5.1)
Sodium: 138 mmol/L (ref 135–145)
Total Bilirubin: 0.4 mg/dL (ref 0.3–1.2)
Total Protein: 6.1 g/dL — ABNORMAL LOW (ref 6.5–8.1)

## 2019-07-09 LAB — MAGNESIUM: Magnesium: 2 mg/dL (ref 1.7–2.4)

## 2019-07-09 MED ORDER — PEGFILGRASTIM-CBQV 6 MG/0.6ML ~~LOC~~ SOSY
6.0000 mg | PREFILLED_SYRINGE | Freq: Once | SUBCUTANEOUS | Status: AC
  Administered 2019-07-09: 6 mg via SUBCUTANEOUS

## 2019-07-09 MED ORDER — SODIUM CHLORIDE 0.9% FLUSH
10.0000 mL | Freq: Once | INTRAVENOUS | Status: AC
  Administered 2019-07-09: 10 mL
  Filled 2019-07-09: qty 10

## 2019-07-09 MED ORDER — HEPARIN SOD (PORK) LOCK FLUSH 100 UNIT/ML IV SOLN
500.0000 [IU] | Freq: Once | INTRAVENOUS | Status: AC
  Administered 2019-07-09: 10:00:00 500 [IU]
  Filled 2019-07-09: qty 5

## 2019-07-09 MED ORDER — PEGFILGRASTIM-CBQV 6 MG/0.6ML ~~LOC~~ SOSY
PREFILLED_SYRINGE | SUBCUTANEOUS | Status: AC
  Filled 2019-07-09: qty 0.6

## 2019-07-09 MED ORDER — PANTOPRAZOLE SODIUM 40 MG PO TBEC: 40.0000 mg | DELAYED_RELEASE_TABLET | Freq: Every day | ORAL | 5 refills | Status: AC

## 2019-07-09 NOTE — Telephone Encounter (Signed)
Per 7/27 los No change in appts

## 2019-07-09 NOTE — Progress Notes (Signed)
[facility] OFFICE PROGRESS NOTE  Patient Care Team: Maury Dus, MD as PCP - General (Family Medicine)  HEME/ONC OVERVIEW: 1. 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, GCB subtype -01/2019:   PET showed extensive FDG-avid adenopathy in the cervical, paratracheal, peri-aortic, mesenteric and iliac LN's, as well as extensive skeletal involvement throughout the axial and appendicular skeleton; expansile soft tissue lesions in the left scapula and right sacrum  L supraclavicular LN bx showed DLBCL, Ki-67 70-80%, GCB subtype; FISH positive for Bcl-2 rearrangement (41.5%) but negative for Bcl-6 and Myc rearrangement   MRI brain negative for any CNS involvement  Poor prognosis by R-IPI; intermediate risk by CNS-IPI  -Mid-01/2019 - 05/2019: R-CHOP with Udenyca x 6 cycles  HD MTX after Cycles 2 and 4 of R-CHOP at [facility]   PET after 3 cycles of chemotherapy showed marked response   (A) Relapsing/refractory disease  -Late 05/2019: new FDG-avid left supraclavicular disease on PET, LDH > 3000; bx confirmed relapsed DLBCL -Early 06/2019: R-ICE x 1 -Late 06/2019 - present: R-DHAP due to rapidly progressing disease    TREATMENT REGIMEN:  02/01/2019 - 05/25/2019: R-CHOP with Udenyca x 6 cycles - IT MTX with Cycle 1; HD MTX with Cycles 2 and 4 of chemotherapy   06/14/2019: R-ICE x 1  07/05/2019 - present: R-DHAP   ASSESSMENT & PLAN:   Relapsed/refractory Stage IV DLBCL  -s/p R-CHOP x 6 cycles and R-ICE x 1 cycle -Unfortunately, he has had rapid disease progression despite R-ICE and his chemotherapy regimen was changed to R-DHAP -He tolerated R-DHPA relatively well; see the management of chemotherapy side effects below -Salvage treatment options (CAR-T, transplant, or clinical trials) to be determined, pending the duration of response to chemotherapy  -We will monitor his labs on Monday/Thurs after each round of chemotherapy to assess any need for  supportive transfusion and electrolytes  -Prophylaxis: acyclovir, allopurinol  Chemotherapy-associated anemia -Secondary to chemotherapy -Hgb 9.4 today  -Patient denies any symptom of bleeding -No indication for RBC transfusion; goal Hgb > 7  Chemotherapy-associated thrombocytopenia -Secondary to chemotherapy -Plts 109k today -Patient denies any symptoms of bleeding or excess bruising, such as epistaxis, hematochezia, melena, or hematuria -No indication for transfusion; goal plts > 10k   Chemotherapy-associated leukopenia -Secondary to chemotherapy -WBC 3.5k with [place] 3000 -Patient denies any symptoms of infection -We will administer Udenyca today -Patient was counseled on any signs/symptoms of infection, including fever > 100.4, for which he is instructed to contact the clinic ASAP  Chemotherapy-associated nausea  -Secondary to chemotherapy -Symptoms relatively well controlled  -Continue PRN-anti-emetics   Acid reflux -Brief improvement with calcium carbonate -I have prescribed Protonix '40mg'$  daily   No orders of the defined types were placed in this encounter.  All questions were answered. The patient knows to call the clinic with any problems, questions or concerns. No barriers to learning was detected.  Labs on Monday/Thurs. PRN transfusion and electrolyte repletion on Tues/Friday.   Tish Men, MD 07/09/2019 10:04 AM  CHIEF COMPLAINT: "I am doing okay"  INTERVAL HISTORY: Mr. Holzer returns to clinic for follow-up of relapsing/refractory DLBCL.  Patient was admitted at [facility] this past Thursday for initiation of R-DHAP after he had relapsing disease post-1 cycle of R-ICE.  He tolerated treatment relatively well, except hair loss and nausea.  He took Zofran with relief and did not have any vomiting.  His left-sided neck swelling and shoulder swelling has improved since the recent chemotherapy.  He also reports new onset acid  reflux, for which he has taken Tum with some  relief, but it only lasts about 2 hours.  He denies any fever, chill, night sweats, chest pain, dyspnea, abdominal pain, diarrhea, hematochezia, or melena.  SUMMARY OF ONCOLOGIC HISTORY: Oncology History  Diffuse large B-cell lymphoma ([place])  12/31/2018 Imaging   CT neck: IMPRESSION: Enlarged cervical lymph nodes in the neck as above. Most prominent nodes are in the left supraclavicular region. This may be due to metastatic disease or lymphoma. No primary head and neck cancer identified.   12/31/2018 Imaging   CTA chest: IMPRESSION: 1. 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 Abnormal soft tissue associated with both kidneys and surrounding the proximal left ureter is also likely due to lymphoma. This soft tissue associated with the kidneys and left ureter results in mild bilateral hydronephrosis, left greater than right. 2. Small pleural effusions, mild pulmonary edema, ascites, and fluid and stranding in the mesentery is all likely due to third spacing of fluid. The mesenteric findings surrounds the pancreas but is probably part of the broader process of fluid third-spacing. Recommend clinical correlation to exclude signs of pancreatitis. If there is any concern for pancreatitis, recommend obtaining a lipase. 3. Small pulmonary nodules as above. 4. No pulmonary emboli. 5. Coronary artery calcifications.   12/31/2018 Imaging   CT abdomen/pelvis: IMPRESSION: 1. 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 Abnormal soft tissue associated with both kidneys and surrounding the proximal left ureter is also likely due to lymphoma. This soft tissue associated with the kidneys and left ureter results in mild bilateral hydronephrosis, left greater than right. 2. Small pleural effusions, mild pulmonary edema, ascites, and fluid and stranding in the mesentery  is all likely due to third spacing of fluid. The mesenteric findings surrounds the pancreas but is probably part of the broader process of fluid third-spacing. Recommend clinical correlation to exclude signs of pancreatitis. If there is any concern for pancreatitis, recommend obtaining a lipase. 3. Small pulmonary nodules as above. 4. No pulmonary emboli. 5. Coronary artery calcifications.   01/01/2019 Initial Diagnosis   Diffuse large B-cell lymphoma ([place])   01/12/2019 Procedure   Left supraclavicular LN biopsy by Dr. Dalbert Batman   01/12/2019 Pathology Results   Accession: [id number]  Tissue-Flow Cytometry - MONOCLONAL B-CELL POPULATION WITH EXPRESSION OF CD10 COMPRISES 80% OF ALL LYMPHOCYTES - SEE COMMENT   01/12/2019 Pathology Results   Accession: [id number]  Lymph node for lymphoma, Left supraclavicular - DIFFUSE LARGE B-CELL LYMPHOMA - SEE COMMENT Microscopic Comment The excisional biopsy material is composed diffuse lymphoid proliferation. The lymphocytes are medium to large with vesicular chromatin, inconspicuous to prominent nucleoli, scant cytoplasm and admixed small benign lymphocytes. The atypical cells are B-cells by CD20 immunohistochemistry with expression of CD10, bcl-6, and bcl-2. The proliferative rate is high by ki-67 (70-80%). The neoplastic cells do not express CD5 or EBV by in-situ hybridization. CD3 highlights background T-cells. Flow cytometry performed on the sample identified a kappa-restricted B-cell population with expression of CD10 that comprised 80% of all lymphocytes (See [id number]).   01/15/2019 Imaging   PET: IMPRESSION: 1. Extensive hypermetabolic adenopathy in the cervical lymph nodes, paratracheal nodes, periaortic nodes and iliac nodes. 2. Hypermetabolic nodal metastasis within the mesentery of the small bowel colon. 3. Extensive skeletal metastasis within the axillary and appendicular skeleton with intense metabolic activity. Expansile soft  tissue lesions in the superior aspect of the LEFT scapula  and RIGHT sacrum. These expansile soft tissue bone metastasis may be best targets for biopsy. 4. Normal spleen.    02/01/2019 - 06/12/2019 Chemotherapy   The patient had DOXOrubicin (ADRIAMYCIN) chemo injection 100 mg, 49.5 mg/m2 = 102 mg, Intravenous,  Once, 6 of 6 cycles Administration: 100 mg (02/01/2019), 100 mg (02/22/2019), 100 mg (03/22/2019), 100 mg (04/12/2019), 100 mg (05/03/2019), 100 mg (05/24/2019) palonosetron (ALOXI) injection 0.25 mg, 0.25 mg, Intravenous,  Once, 6 of 6 cycles Administration: 0.25 mg (02/01/2019), 0.25 mg (02/22/2019), 0.25 mg (03/22/2019), 0.25 mg (04/12/2019), 0.25 mg (05/03/2019), 0.25 mg (05/24/2019) pegfilgrastim-cbqv (UDENYCA) injection 6 mg, 6 mg, Subcutaneous, Once, 6 of 6 cycles Administration: 6 mg (02/02/2019), 6 mg (02/23/2019), 6 mg (03/23/2019), 6 mg (04/13/2019), 6 mg (05/04/2019), 6 mg (05/25/2019) vinCRIStine (ONCOVIN) 2 mg in sodium chloride 0.9 % 50 mL chemo infusion, 2 mg, Intravenous,  Once, 6 of 6 cycles Administration: 2 mg (02/01/2019), 2 mg (02/22/2019), 2 mg (03/22/2019), 2 mg (04/12/2019), 2 mg (05/03/2019), 2 mg (05/24/2019) methotrexate (PF) 12 mg in sodium chloride (PF) 0.9 % INTRATHECAL chemo injection, , Intrathecal,  Once, 1 of 1 cycle Administration:  (02/05/2019) riTUXimab (RITUXAN) 800 mg in sodium chloride 0.9 % 250 mL (2.4242 mg/mL) infusion, 375 mg/m2 = 800 mg, Intravenous,  Once, 1 of 1 cycle Administration: 800 mg (02/01/2019) cyclophosphamide (CYTOXAN) 1,540 mg in sodium chloride 0.9 % 250 mL chemo infusion, 750 mg/m2 = 1,540 mg, Intravenous,  Once, 6 of 6 cycles Administration: 1,540 mg (02/01/2019), 1,540 mg (02/22/2019), 1,540 mg (03/22/2019), 1,540 mg (04/12/2019), 1,540 mg (05/03/2019), 1,540 mg (05/24/2019) riTUXimab (RITUXAN) 800 mg in sodium chloride 0.9 % 170 mL infusion, 375 mg/m2 = 800 mg (100 % of original dose 375 mg/m2), Intravenous,  Once, 5 of 5 cycles Dose modification: 375 mg/m2  (original dose 375 mg/m2, Cycle 2, Reason: Provider Judgment, Comment: Changing to rapid infusion, tolerated 1st dose well) Administration: 800 mg (02/22/2019), 800 mg (03/22/2019), 800 mg (04/12/2019), 800 mg (05/03/2019), 800 mg (05/24/2019)  for chemotherapy treatment.    03/29/2019 Imaging   PET (after 3 cycles of R-CHOP): IMPRESSION: 1. Marked response to therapy of lymphoma, including significant improvement and resolution of nodal disease within the neck, chest, abdomen, and pelvis. ([place] 3). 2. The dominant left scapular and right sacral lesions are significantly improved/resolved. There is increased heterogeneous marrow activity throughout, favored to be due to stimulation by chemotherapy. 3. Age advanced coronary artery atherosclerosis. Recommend assessment of coronary risk factors and consideration of medical therapy. 4.  Aortic Atherosclerosis (ICD10-I70.0).   06/11/2019 Imaging   PET (at [facility]): CONCLUSION:  1.  New area of hypermetabolic left supraclavicular adenopathy consistent with progressive disease. 2.  Marked interval decrease in size and uptake of mesenteric, peritracheal, periaortic, and iliac chain nodes. 3.  Decreased uptake within the destructive soft tissue lesion of the right hemisacrum. New areas of focally decreased uptake within the left scapular soft tissue lesion suggestive of focal necrosis. 4.  Splenomegaly with diffusely increased uptake which may indicate splenic involvement. 5.  Multilevel degenerative changes of the cervical spine with at least moderate canal stenosis. This can be further evaluated with dedicated cervical spine MRI if clinically indicated.     REVIEW OF SYSTEMS:   Constitutional: ( - ) fevers, ( - )  chills , ( - ) night sweats Eyes: ( - ) blurriness of vision, ( - ) double vision, ( - ) watery eyes Ears, nose, mouth, throat, and face: ( - ) mucositis, ( - )  sore throat Respiratory: ( - ) cough, ( - ) dyspnea, ( - )  wheezes Cardiovascular: ( - ) palpitation, ( - ) chest discomfort, ( - ) lower extremity swelling Gastrointestinal:  ( + ) nausea, ( + ) heartburn, ( - ) change in bowel habits Skin: ( - ) abnormal skin rashes Lymphatics: ( - ) new lymphadenopathy, ( - ) easy bruising Neurological: ( - ) numbness, ( - ) tingling, ( - ) new weaknesses Behavioral/Psych: ( - ) mood change, ( - ) new changes  All other systems were reviewed with the patient and are negative.  I have reviewed the past medical history, past surgical history, social history and family history with the patient and they are unchanged from previous note.  ALLERGIES:  is allergic to penicillins.  MEDICATIONS:  Current Outpatient Medications  Medication Sig Dispense Refill  . acetaminophen (TYLENOL) 325 MG tablet Take 975 mg by mouth every 6 (six) hours as needed for mild pain.    Marland Kitchen docusate sodium (COLACE) 100 MG capsule Take 100 mg by mouth 2 (two) times daily.    . ferrous sulfate 325 (65 FE) MG EC tablet Take 325 mg by mouth daily.     . fluconazole (DIFLUCAN) 100 MG tablet Take 1 tablet (100 mg total) by mouth daily. 30 tablet 3  . gabapentin (NEURONTIN) 600 MG tablet Take 1 tablet (600 mg total) by mouth 2 (two) times daily. 180 tablet 1  . LORazepam (ATIVAN) 0.5 MG tablet Take 1 tablet (0.5 mg total) by mouth every 6 (six) hours as needed (Nausea or vomiting). 30 tablet 0  . magic mouthwash w/lidocaine SOLN Take 5 mLs by mouth 4 (four) times daily as needed for mouth pain. 200 mL 5  . ondansetron (ZOFRAN) 8 MG tablet Take 1 tablet (8 mg total) by mouth 2 (two) times daily as needed for refractory nausea / vomiting. Start on day 3 after cyclophosphamide chemotherapy. 30 tablet 1  . prochlorperazine (COMPAZINE) 10 MG tablet Take 1 tablet (10 mg total) by mouth every 6 (six) hours as needed (Nausea or vomiting). 30 tablet 6  . traMADol (ULTRAM) 50 MG tablet Take 1 tablet (50 mg total) by mouth every 8 (eight) hours as needed. 90  tablet 0  . oxyCODONE-acetaminophen (PERCOCET/ROXICET) 5-325 MG tablet Take 1 tablet by mouth every 6 (six) hours as needed for severe pain. 15 tablet 0   No current facility-administered medications for this visit.    Facility-Administered Medications Ordered in Other Visits  Medication Dose Route Frequency Provider Last Rate Last Dose  . heparin lock flush 100 unit/mL  500 Units Intracatheter Once Tish Men, MD      . pegfilgrastim-cbqv [facility]) injection 6 mg  6 mg Subcutaneous Once Tish Men, MD      . sodium chloride flush (NS) 0.9 % injection 10 mL  10 mL Intracatheter Once Tish Men, MD        PHYSICAL EXAMINATION: ECOG PERFORMANCE STATUS: 1 - Symptomatic but completely ambulatory  Today's Vitals   07/09/19 0953  BP: (!) 129/98  Pulse: 64  Resp: 18  Temp: 98.4 F (36.9 C)  TempSrc: Temporal  SpO2: 99%  Weight: 200 lb 1.9 oz (90.8 kg)  Height: '5\' 9"'$  (1.753 m)  PainSc: 0-No pain   Body mass index is 29.55 kg/m.  Filed Weights   07/09/19 0953  Weight: 200 lb 1.9 oz (90.8 kg)    GENERAL: alert, no distress and comfortable SKIN: skin color, texture, turgor are  normal, no rashes or significant lesions EYES: conjunctiva are pink and non-injected, sclera clear OROPHARYNX: no exudate, no erythema; lips, buccal mucosa, and tongue normal  NECK: supple, non-tender LYMPH:  left supraclavicular fullness, non-tender LUNGS: clear to auscultation with normal breathing effort HEART: regular rate & rhythm and no murmurs and no lower extremity edema ABDOMEN: soft, non-tender, non-distended, normal bowel sounds Musculoskeletal: no cyanosis of digits and no clubbing  PSYCH: alert & oriented x 3, fluent speech NEURO: no focal motor/sensory deficits  LABORATORY DATA:  I have reviewed the data as listed    Component Value Date/Time   NA 139 07/02/2019 1330   K 3.7 07/02/2019 1330   CL 101 07/02/2019 1330   CO2 28 07/02/2019 1330   GLUCOSE 206 (H) 07/02/2019 1330   BUN 16  07/02/2019 1330   CREATININE 0.97 07/02/2019 1330   CALCIUM 8.6 (L) 07/02/2019 1330   PROT 6.3 (L) 07/02/2019 1330   ALBUMIN 4.3 07/02/2019 1330   AST 40 07/02/2019 1330   ALT 36 07/02/2019 1330   ALKPHOS 111 07/02/2019 1330   BILITOT 0.3 07/02/2019 1330   GFRNONAA >60 07/02/2019 1330   GFRAA >60 07/02/2019 1330    No results found for: SPEP, UPEP  Lab Results  Component Value Date   WBC 3.5 (L) 07/09/2019   NEUTROABS 3.0 07/09/2019   HGB 9.4 (L) 07/09/2019   HCT 29.3 (L) 07/09/2019   MCV 88.5 07/09/2019   PLT 109 (L) 07/09/2019      Chemistry      Component Value Date/Time   NA 139 07/02/2019 1330   K 3.7 07/02/2019 1330   CL 101 07/02/2019 1330   CO2 28 07/02/2019 1330   BUN 16 07/02/2019 1330   CREATININE 0.97 07/02/2019 1330      Component Value Date/Time   CALCIUM 8.6 (L) 07/02/2019 1330   ALKPHOS 111 07/02/2019 1330   AST 40 07/02/2019 1330   ALT 36 07/02/2019 1330   BILITOT 0.3 07/02/2019 1330       RADIOGRAPHIC STUDIES: I have personally reviewed the radiological images as listed below and agreed with the findings in the report.

## 2019-07-10 ENCOUNTER — Ambulatory Visit: Payer: BC Managed Care – PPO | Admitting: Radiation Oncology

## 2019-07-10 ENCOUNTER — Inpatient Hospital Stay: Payer: BC Managed Care – PPO

## 2019-07-12 ENCOUNTER — Inpatient Hospital Stay: Payer: BC Managed Care – PPO

## 2019-07-12 ENCOUNTER — Other Ambulatory Visit: Payer: Self-pay

## 2019-07-12 ENCOUNTER — Telehealth: Payer: Self-pay | Admitting: *Deleted

## 2019-07-12 DIAGNOSIS — C8338 Diffuse large B-cell lymphoma, lymph nodes of multiple sites: Secondary | ICD-10-CM | POA: Diagnosis not present

## 2019-07-12 DIAGNOSIS — Z95828 Presence of other vascular implants and grafts: Secondary | ICD-10-CM

## 2019-07-12 LAB — CBC WITH DIFFERENTIAL (CANCER CENTER ONLY)
Abs Immature Granulocytes: 0.3 10*3/uL — ABNORMAL HIGH (ref 0.00–0.07)
Basophils Absolute: 0.1 10*3/uL (ref 0.0–0.1)
Basophils Relative: 0 %
Eosinophils Absolute: 0 10*3/uL (ref 0.0–0.5)
Eosinophils Relative: 0 %
HCT: 27.5 % — ABNORMAL LOW (ref 39.0–52.0)
Hemoglobin: 9 g/dL — ABNORMAL LOW (ref 13.0–17.0)
Immature Granulocytes: 2 %
Lymphocytes Relative: 5 %
Lymphs Abs: 0.9 10*3/uL (ref 0.7–4.0)
MCH: 29 pg (ref 26.0–34.0)
MCHC: 32.7 g/dL (ref 30.0–36.0)
MCV: 88.7 fL (ref 80.0–100.0)
Monocytes Absolute: 0.9 10*3/uL (ref 0.1–1.0)
Monocytes Relative: 5 %
Neutro Abs: 16.8 10*3/uL — ABNORMAL HIGH (ref 1.7–7.7)
Neutrophils Relative %: 88 %
Platelet Count: 51 10*3/uL — ABNORMAL LOW (ref 150–400)
RBC: 3.1 MIL/uL — ABNORMAL LOW (ref 4.22–5.81)
RDW: 15.3 % (ref 11.5–15.5)
WBC Count: 19 10*3/uL — ABNORMAL HIGH (ref 4.0–10.5)
nRBC: 0 % (ref 0.0–0.2)

## 2019-07-12 LAB — CMP (CANCER CENTER ONLY)
ALT: 34 U/L (ref 0–44)
AST: 21 U/L (ref 15–41)
Albumin: 4.1 g/dL (ref 3.5–5.0)
Alkaline Phosphatase: 156 U/L — ABNORMAL HIGH (ref 38–126)
Anion gap: 10 (ref 5–15)
BUN: 17 mg/dL (ref 6–20)
CO2: 28 mmol/L (ref 22–32)
Calcium: 8.6 mg/dL — ABNORMAL LOW (ref 8.9–10.3)
Chloride: 100 mmol/L (ref 98–111)
Creatinine: 0.78 mg/dL (ref 0.61–1.24)
GFR, Est AFR Am: >60 mL/min (ref >60)
GFR, Estimated: >60 mL/min (ref >60)
Glucose, Bld: 119 mg/dL — ABNORMAL HIGH (ref 70–99)
Potassium: 3.6 mmol/L (ref 3.5–5.1)
Sodium: 138 mmol/L (ref 135–145)
Total Bilirubin: 0.3 mg/dL (ref 0.3–1.2)
Total Protein: 6.1 g/dL — ABNORMAL LOW (ref 6.5–8.1)

## 2019-07-12 LAB — MAGNESIUM: Magnesium: 1.8 mg/dL (ref 1.7–2.4)

## 2019-07-12 MED ORDER — SODIUM CHLORIDE 0.9% FLUSH
10.0000 mL | INTRAVENOUS | Status: DC | PRN
  Administered 2019-07-12: 10 mL via INTRAVENOUS
  Filled 2019-07-12: qty 10

## 2019-07-12 MED ORDER — HEPARIN SOD (PORK) LOCK FLUSH 100 UNIT/ML IV SOLN
500.0000 [IU] | Freq: Once | INTRAVENOUS | Status: AC
  Administered 2019-07-12: 12:00:00 500 [IU] via INTRAVENOUS
  Filled 2019-07-12: qty 5

## 2019-07-12 NOTE — Telephone Encounter (Signed)
Patient notified per order of Dr. Maylon Peppers that lab work from today looks okay and that he does not need to come in for infusion tomorrow.  Patient appreciative of call and has no questions or concerns at this time.

## 2019-07-13 ENCOUNTER — Inpatient Hospital Stay: Payer: BC Managed Care – PPO

## 2019-07-16 ENCOUNTER — Inpatient Hospital Stay (HOSPITAL_BASED_OUTPATIENT_CLINIC_OR_DEPARTMENT_OTHER): Payer: BC Managed Care – PPO | Admitting: Hematology

## 2019-07-16 ENCOUNTER — Encounter: Payer: Self-pay | Admitting: Hematology

## 2019-07-16 ENCOUNTER — Inpatient Hospital Stay: Payer: BC Managed Care – PPO

## 2019-07-16 ENCOUNTER — Other Ambulatory Visit: Payer: Self-pay

## 2019-07-16 ENCOUNTER — Inpatient Hospital Stay: Payer: BC Managed Care – PPO | Attending: Hematology

## 2019-07-16 VITALS — BP 126/89 | HR 105 | Temp 99.4°F | Resp 19 | Ht 69.0 in

## 2019-07-16 DIAGNOSIS — D72829 Elevated white blood cell count, unspecified: Secondary | ICD-10-CM | POA: Insufficient documentation

## 2019-07-16 DIAGNOSIS — D701 Agranulocytosis secondary to cancer chemotherapy: Secondary | ICD-10-CM

## 2019-07-16 DIAGNOSIS — D6959 Other secondary thrombocytopenia: Secondary | ICD-10-CM

## 2019-07-16 DIAGNOSIS — C8338 Diffuse large B-cell lymphoma, lymph nodes of multiple sites: Secondary | ICD-10-CM

## 2019-07-16 DIAGNOSIS — T451X5A Adverse effect of antineoplastic and immunosuppressive drugs, initial encounter: Secondary | ICD-10-CM

## 2019-07-16 DIAGNOSIS — D6481 Anemia due to antineoplastic chemotherapy: Secondary | ICD-10-CM

## 2019-07-16 DIAGNOSIS — Z95828 Presence of other vascular implants and grafts: Secondary | ICD-10-CM

## 2019-07-16 LAB — CBC WITH DIFFERENTIAL (CANCER CENTER ONLY)
Abs Immature Granulocytes: 1.8 10*3/uL — ABNORMAL HIGH (ref 0.00–0.07)
Band Neutrophils: 5 %
Basophils Absolute: 0 10*3/uL (ref 0.0–0.1)
Basophils Relative: 0 %
Blasts: 5 %
Eosinophils Absolute: 0 10*3/uL (ref 0.0–0.5)
Eosinophils Relative: 0 %
HCT: 29.5 % — ABNORMAL LOW (ref 39.0–52.0)
Hemoglobin: 9.8 g/dL — ABNORMAL LOW (ref 13.0–17.0)
Lymphocytes Relative: 6 %
Lymphs Abs: 2.2 10*3/uL (ref 0.7–4.0)
MCH: 28.8 pg (ref 26.0–34.0)
MCHC: 33.2 g/dL (ref 30.0–36.0)
MCV: 86.8 fL (ref 80.0–100.0)
Metamyelocytes Relative: 4 %
Monocytes Absolute: 5.8 10*3/uL — ABNORMAL HIGH (ref 0.1–1.0)
Monocytes Relative: 16 %
Myelocytes: 1 %
Neutro Abs: 24.6 10*3/uL — ABNORMAL HIGH (ref 1.7–7.7)
Neutrophils Relative %: 63 %
Platelet Count: 41 10*3/uL — ABNORMAL LOW (ref 150–400)
RBC: 3.4 MIL/uL — ABNORMAL LOW (ref 4.22–5.81)
RDW: 16.7 % — ABNORMAL HIGH (ref 11.5–15.5)
WBC Count: 36.2 10*3/uL — ABNORMAL HIGH (ref 4.0–10.5)
nRBC: 0.2 % (ref 0.0–0.2)

## 2019-07-16 LAB — CMP (CANCER CENTER ONLY)
ALT: 41 U/L (ref 0–44)
AST: 300 U/L (ref 15–41)
Albumin: 4.1 g/dL (ref 3.5–5.0)
Alkaline Phosphatase: 237 U/L — ABNORMAL HIGH (ref 38–126)
Anion gap: 12 (ref 5–15)
BUN: 16 mg/dL (ref 6–20)
CO2: 29 mmol/L (ref 22–32)
Calcium: 9.1 mg/dL (ref 8.9–10.3)
Chloride: 90 mmol/L — ABNORMAL LOW (ref 98–111)
Creatinine: 0.95 mg/dL (ref 0.61–1.24)
GFR, Est AFR Am: >60 mL/min (ref >60)
GFR, Estimated: >60 mL/min (ref >60)
Glucose, Bld: 127 mg/dL — ABNORMAL HIGH (ref 70–99)
Potassium: 3.9 mmol/L (ref 3.5–5.1)
Sodium: 131 mmol/L — ABNORMAL LOW (ref 135–145)
Total Bilirubin: 0.4 mg/dL (ref 0.3–1.2)
Total Protein: 6.7 g/dL (ref 6.5–8.1)

## 2019-07-16 LAB — MAGNESIUM: Magnesium: 1.9 mg/dL (ref 1.7–2.4)

## 2019-07-16 MED ORDER — HEPARIN SOD (PORK) LOCK FLUSH 100 UNIT/ML IV SOLN
500.0000 [IU] | Freq: Once | INTRAVENOUS | Status: AC
  Administered 2019-07-16: 500 [IU] via INTRAVENOUS
  Filled 2019-07-16: qty 5

## 2019-07-16 MED ORDER — SODIUM CHLORIDE 0.9% FLUSH
10.0000 mL | Freq: Once | INTRAVENOUS | Status: AC
  Administered 2019-07-16: 10 mL via INTRAVENOUS
  Filled 2019-07-16: qty 10

## 2019-07-16 NOTE — Progress Notes (Addendum)
[place] OFFICE PROGRESS NOTE  Patient Care Team: Maury Dus, MD as PCP - General (Family Medicine)  HEME/ONC OVERVIEW: 1. 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, GCB subtype -01/2019:   PET showed extensive FDG-avid adenopathy in the cervical, paratracheal, peri-aortic, mesenteric and iliac LN's, as well as extensive skeletal involvement throughout the axial and appendicular skeleton; expansile soft tissue lesions in the left scapula and right sacrum  L supraclavicular LN bx showed DLBCL, Ki-67 70-80%, GCB subtype; FISH positive for Bcl-2 rearrangement (41.5%) but negative for Bcl-6 and Myc rearrangement   MRI brain negative for any CNS involvement  Poor prognosis by R-IPI; intermediate risk by CNS-IPI  -Mid-01/2019 - 05/2019: R-CHOP with Udenyca x 6 cycles  HD MTX after Cycles 2 and 4 of R-CHOP at [facility]   PET after 3 cycles of chemotherapy showed marked response   (A) Relapsing/refractory disease  -Late 05/2019: new FDG-avid left supraclavicular disease on PET, LDH > 3000; bx confirmed relapsed DLBCL -Early 06/2019: R-ICE x 1 -Late 06/2019: R-DHAP x 1  (B) Suspected acute leukemia transformation -07/2019: WBC 36k with circulating blasts on peripheral blood smear, concerning for acute leukemic transformation  TREATMENT REGIMEN:  02/01/2019 - 05/25/2019: R-CHOP with Udenyca x 6 cycles  IT MTX with Cycle 1; HD MTX with Cycles 2 and 4 of chemotherapy   06/14/2019: R-ICE x 1  07/05/2019: R-DHAP x 1   ASSESSMENT & PLAN:   Relapsed/refractory Stage IV DLBCL with suspected acute leukemic transformation -S/p R-CHOP x 6 cycles, R-ICE x 1 and R-DHAP x 1 -Clinically, patient reports increasing night sweats, rapidly enlarging left supraclavicular lymphadenopathy and worsening bilateral lower extremity pain, consistent with rapid disease relapse -I personally reviewed the patient's peripheral smear, which showed multiple new immature  WBCs with enlarged nuclei and loose chromatin, consistent with circulating blasts -In light of the patient's refractory DLBCL with now circulating blasts, this is consistent with acute leukemic transformation of the underlying NHL -I reviewed the lab and the peripheral blood smear findings with the patient and his family, and instructed them to go to the [facility] ER for further evaluation, including likely induction chemotherapy for transformed acute leukemia -I also discussed the case with the on-call hematologist at [facility], who was in agreement with the plan and will update the inpatient hematology team -Prophylaxis: acyclovir, allopurinol  Leukocytosis -Most likely secondary to acute leukemia transformation -WBC 36.2k with peripheral blood circulating blasts -Clinically, patient denies any symptoms of infection -See the management as above for acute leukemic transformation  Normocytic anemia -Secondary to chemotherapy and acute leukemic transformation -Hgb 9.4 today  -Patient denies any symptom of bleeding -No indication for RBC transfusion  Thrombocytopenia -Secondary to chemotherapy and acute leukemic transformation -Plts 41k today -Patient denies any symptoms of bleeding or excess bruising, such as epistaxis, hematochezia, melena, or hematuria -No indication for transfusion  No orders of the defined types were placed in this encounter.  All questions were answered. The patient knows to call the clinic with any problems, questions or concerns. No barriers to learning was detected.  Return to clinic appointment to be determined, pending treatment plan to the workforce.  Tish Men, MD 07/16/2019 4:07 PM  CHIEF COMPLAINT: "I am having night sweats again"  INTERVAL HISTORY: Mr. Burrous returns to clinic for scheduled lab appointment prior to transfusion as needed, and was seen as an add-on.  He reports that over the past 3 to 4 days, he has developed worsening night sweats,  progressively enlarging left supraclavicular lymph nodes, and worsening bilateral lower extremity pain.  Due to the worsening leg pain, he has not been able to stand up without assistance, and arrived in clinic in a wheelchair today.  He denies any fever, chest pain, dyspnea, abdominal pain, nausea, vomiting, diarrhea, or abnormal bleeding/bruising.  SUMMARY OF ONCOLOGIC HISTORY: Oncology History  Diffuse large B-cell lymphoma ([place])  12/31/2018 Imaging   CT neck: IMPRESSION: Enlarged cervical lymph nodes in the neck as above. Most prominent nodes are in the left supraclavicular region. This may be due to metastatic disease or lymphoma. No primary head and neck cancer identified.   12/31/2018 Imaging   CTA chest: IMPRESSION: 1. 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 Abnormal soft tissue associated with both kidneys and surrounding the proximal left ureter is also likely due to lymphoma. This soft tissue associated with the kidneys and left ureter results in mild bilateral hydronephrosis, left greater than right. 2. Small pleural effusions, mild pulmonary edema, ascites, and fluid and stranding in the mesentery is all likely due to third spacing of fluid. The mesenteric findings surrounds the pancreas but is probably part of the broader process of fluid third-spacing. Recommend clinical correlation to exclude signs of pancreatitis. If there is any concern for pancreatitis, recommend obtaining a lipase. 3. Small pulmonary nodules as above. 4. No pulmonary emboli. 5. Coronary artery calcifications.   12/31/2018 Imaging   CT abdomen/pelvis: IMPRESSION: 1. 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 Abnormal soft tissue associated with both kidneys and surrounding the proximal left ureter is also likely due to lymphoma. This soft tissue associated with  the kidneys and left ureter results in mild bilateral hydronephrosis, left greater than right. 2. Small pleural effusions, mild pulmonary edema, ascites, and fluid and stranding in the mesentery is all likely due to third spacing of fluid. The mesenteric findings surrounds the pancreas but is probably part of the broader process of fluid third-spacing. Recommend clinical correlation to exclude signs of pancreatitis. If there is any concern for pancreatitis, recommend obtaining a lipase. 3. Small pulmonary nodules as above. 4. No pulmonary emboli. 5. Coronary artery calcifications.   01/01/2019 Initial Diagnosis   Diffuse large B-cell lymphoma ([place])   01/12/2019 Procedure   Left supraclavicular LN biopsy by Dr. Dalbert Batman   01/12/2019 Pathology Results   Accession: [id number]  Tissue-Flow Cytometry - MONOCLONAL B-CELL POPULATION WITH EXPRESSION OF CD10 COMPRISES 80% OF ALL LYMPHOCYTES - SEE COMMENT   01/12/2019 Pathology Results   Accession: [id number]  Lymph node for lymphoma, Left supraclavicular - DIFFUSE LARGE B-CELL LYMPHOMA - SEE COMMENT Microscopic Comment The excisional biopsy material is composed diffuse lymphoid proliferation. The lymphocytes are medium to large with vesicular chromatin, inconspicuous to prominent nucleoli, scant cytoplasm and admixed small benign lymphocytes. The atypical cells are B-cells by CD20 immunohistochemistry with expression of CD10, bcl-6, and bcl-2. The proliferative rate is high by ki-67 (70-80%). The neoplastic cells do not express CD5 or EBV by in-situ hybridization. CD3 highlights background T-cells. Flow cytometry performed on the sample identified a kappa-restricted B-cell population with expression of CD10 that comprised 80% of all lymphocytes (See [id number]).   01/15/2019 Imaging   PET: IMPRESSION: 1. Extensive hypermetabolic adenopathy in the cervical lymph nodes, paratracheal nodes, periaortic nodes and iliac nodes. 2. Hypermetabolic  nodal metastasis within the mesentery of the small bowel colon. 3. Extensive skeletal metastasis within the axillary and  appendicular skeleton with intense metabolic activity. Expansile soft tissue lesions in the superior aspect of the LEFT scapula and RIGHT sacrum. These expansile soft tissue bone metastasis may be best targets for biopsy. 4. Normal spleen.    02/01/2019 - 06/12/2019 Chemotherapy   The patient had DOXOrubicin (ADRIAMYCIN) chemo injection 100 mg, 49.5 mg/m2 = 102 mg, Intravenous,  Once, 6 of 6 cycles Administration: 100 mg (02/01/2019), 100 mg (02/22/2019), 100 mg (03/22/2019), 100 mg (04/12/2019), 100 mg (05/03/2019), 100 mg (05/24/2019) palonosetron (ALOXI) injection 0.25 mg, 0.25 mg, Intravenous,  Once, 6 of 6 cycles Administration: 0.25 mg (02/01/2019), 0.25 mg (02/22/2019), 0.25 mg (03/22/2019), 0.25 mg (04/12/2019), 0.25 mg (05/03/2019), 0.25 mg (05/24/2019) pegfilgrastim-cbqv (UDENYCA) injection 6 mg, 6 mg, Subcutaneous, Once, 6 of 6 cycles Administration: 6 mg (02/02/2019), 6 mg (02/23/2019), 6 mg (03/23/2019), 6 mg (04/13/2019), 6 mg (05/04/2019), 6 mg (05/25/2019) vinCRIStine (ONCOVIN) 2 mg in sodium chloride 0.9 % 50 mL chemo infusion, 2 mg, Intravenous,  Once, 6 of 6 cycles Administration: 2 mg (02/01/2019), 2 mg (02/22/2019), 2 mg (03/22/2019), 2 mg (04/12/2019), 2 mg (05/03/2019), 2 mg (05/24/2019) methotrexate (PF) 12 mg in sodium chloride (PF) 0.9 % INTRATHECAL chemo injection, , Intrathecal,  Once, 1 of 1 cycle Administration:  (02/05/2019) riTUXimab (RITUXAN) 800 mg in sodium chloride 0.9 % 250 mL (2.4242 mg/mL) infusion, 375 mg/m2 = 800 mg, Intravenous,  Once, 1 of 1 cycle Administration: 800 mg (02/01/2019) cyclophosphamide (CYTOXAN) 1,540 mg in sodium chloride 0.9 % 250 mL chemo infusion, 750 mg/m2 = 1,540 mg, Intravenous,  Once, 6 of 6 cycles Administration: 1,540 mg (02/01/2019), 1,540 mg (02/22/2019), 1,540 mg (03/22/2019), 1,540 mg (04/12/2019), 1,540 mg (05/03/2019), 1,540 mg  (05/24/2019) riTUXimab (RITUXAN) 800 mg in sodium chloride 0.9 % 170 mL infusion, 375 mg/m2 = 800 mg (100 % of original dose 375 mg/m2), Intravenous,  Once, 5 of 5 cycles Dose modification: 375 mg/m2 (original dose 375 mg/m2, Cycle 2, Reason: Provider Judgment, Comment: Changing to rapid infusion, tolerated 1st dose well) Administration: 800 mg (02/22/2019), 800 mg (03/22/2019), 800 mg (04/12/2019), 800 mg (05/03/2019), 800 mg (05/24/2019)  for chemotherapy treatment.    03/29/2019 Imaging   PET (after 3 cycles of R-CHOP): IMPRESSION: 1. Marked response to therapy of lymphoma, including significant improvement and resolution of nodal disease within the neck, chest, abdomen, and pelvis. ([place] 3). 2. The dominant left scapular and right sacral lesions are significantly improved/resolved. There is increased heterogeneous marrow activity throughout, favored to be due to stimulation by chemotherapy. 3. Age advanced coronary artery atherosclerosis. Recommend assessment of coronary risk factors and consideration of medical therapy. 4.  Aortic Atherosclerosis (ICD10-I70.0).   06/11/2019 Imaging   PET (at [facility]): CONCLUSION:  1.  New area of hypermetabolic left supraclavicular adenopathy consistent with progressive disease. 2.  Marked interval decrease in size and uptake of mesenteric, peritracheal, periaortic, and iliac chain nodes. 3.  Decreased uptake within the destructive soft tissue lesion of the right hemisacrum. New areas of focally decreased uptake within the left scapular soft tissue lesion suggestive of focal necrosis. 4.  Splenomegaly with diffusely increased uptake which may indicate splenic involvement. 5.  Multilevel degenerative changes of the cervical spine with at least moderate canal stenosis. This can be further evaluated with dedicated cervical spine MRI if clinically indicated.     REVIEW OF SYSTEMS:   Constitutional: ( - ) fevers, ( - )  chills , ( + ) night  sweats Eyes: ( - ) blurriness of vision, ( - ) double  vision, ( - ) watery eyes Ears, nose, mouth, throat, and face: ( - ) mucositis, ( - ) sore throat Respiratory: ( - ) cough, ( - ) dyspnea, ( - ) wheezes Cardiovascular: ( - ) palpitation, ( - ) chest discomfort, ( - ) lower extremity swelling Gastrointestinal:  ( - ) nausea, ( - ) heartburn, ( - ) change in bowel habits Skin: ( - ) abnormal skin rashes Lymphatics: ( + ) new lymphadenopathy, ( - ) easy bruising Neurological: ( - ) numbness, ( - ) tingling, ( - ) new weaknesses Behavioral/Psych: ( - ) mood change, ( - ) new changes  All other systems were reviewed with the patient and are negative.  I have reviewed the past medical history, past surgical history, social history and family history with the patient and they are unchanged from previous note.  ALLERGIES:  is allergic to penicillins.  MEDICATIONS:  Current Outpatient Medications  Medication Sig Dispense Refill  . acetaminophen (TYLENOL) 325 MG tablet Take 975 mg by mouth every 6 (six) hours as needed for mild pain.    Marland Kitchen docusate sodium (COLACE) 100 MG capsule Take 100 mg by mouth 2 (two) times daily.    . ferrous sulfate 325 (65 FE) MG EC tablet Take 325 mg by mouth daily.     . fluconazole (DIFLUCAN) 100 MG tablet Take 1 tablet (100 mg total) by mouth daily. 30 tablet 3  . gabapentin (NEURONTIN) 600 MG tablet Take 1 tablet (600 mg total) by mouth 2 (two) times daily. 180 tablet 1  . LORazepam (ATIVAN) 0.5 MG tablet Take 1 tablet (0.5 mg total) by mouth every 6 (six) hours as needed (Nausea or vomiting). 30 tablet 0  . magic mouthwash w/lidocaine SOLN Take 5 mLs by mouth 4 (four) times daily as needed for mouth pain. 200 mL 5  . oxyCODONE-acetaminophen (PERCOCET/ROXICET) 5-325 MG tablet Take 1 tablet by mouth every 6 (six) hours as needed for severe pain. 15 tablet 0  . pantoprazole (PROTONIX) 40 MG tablet Take 1 tablet (40 mg total) by mouth daily. 30 tablet 5  . traMADol  (ULTRAM) 50 MG tablet Take 1 tablet (50 mg total) by mouth every 8 (eight) hours as needed. 90 tablet 0  . ondansetron (ZOFRAN) 8 MG tablet Take 1 tablet (8 mg total) by mouth 2 (two) times daily as needed for refractory nausea / vomiting. Start on day 3 after cyclophosphamide chemotherapy. (Patient not taking: Reported on 07/16/2019) 30 tablet 1  . prochlorperazine (COMPAZINE) 10 MG tablet Take 1 tablet (10 mg total) by mouth every 6 (six) hours as needed (Nausea or vomiting). (Patient not taking: Reported on 07/16/2019) 30 tablet 6   No current facility-administered medications for this visit.     PHYSICAL EXAMINATION: ECOG PERFORMANCE STATUS: ECOG 1  Today's Vitals   07/16/19 1606  BP: 126/89  Pulse: (!) 105  Resp: 19  Temp: 99.4 F (37.4 C)  TempSrc: Oral  SpO2: 97%  Height: 5' 9" (1.753 m)  PainSc: 0-No pain   Body mass index is 29.55 kg/m.  Filed Weights    GENERAL: alert, no distress, visible sweats over the scalp  SKIN: skin color, texture, turgor are normal, no rashes or significant lesions EYES: conjunctiva are pink and non-injected, sclera clear NECK: supple, non-tender LYMPH:   bulky left supraclavicular lymphadenopathy, progressive  LUNGS: clear to auscultation with normal breathing effort HEART: regular rate, mild tachycardia, and no murmurs, 1+ bilateral lower extremity edema ABDOMEN: soft, non-tender,  non-distended, normal bowel sounds Musculoskeletal: no cyanosis of digits and no clubbing  PSYCH: alert & oriented x 3, fluent speech  LABORATORY DATA:  I have reviewed the data as listed    Component Value Date/Time   NA 131 (L) 07/16/2019 1430   K 3.9 07/16/2019 1430   CL 90 (L) 07/16/2019 1430   CO2 29 07/16/2019 1430   GLUCOSE 127 (H) 07/16/2019 1430   BUN 16 07/16/2019 1430   CREATININE 0.95 07/16/2019 1430   CALCIUM 9.1 07/16/2019 1430   PROT 6.7 07/16/2019 1430   ALBUMIN 4.1 07/16/2019 1430   AST 300 (HH) 07/16/2019 1430   ALT 41 07/16/2019 1430    ALKPHOS 237 (H) 07/16/2019 1430   BILITOT 0.4 07/16/2019 1430   GFRNONAA >60 07/16/2019 1430   GFRAA >60 07/16/2019 1430    No results found for: SPEP, UPEP  Lab Results  Component Value Date   WBC 36.2 (H) 07/16/2019   NEUTROABS 24.6 (H) 07/16/2019   HGB 9.8 (L) 07/16/2019   HCT 29.5 (L) 07/16/2019   MCV 86.8 07/16/2019   PLT 41 (L) 07/16/2019      Chemistry      Component Value Date/Time   NA 131 (L) 07/16/2019 1430   K 3.9 07/16/2019 1430   CL 90 (L) 07/16/2019 1430   CO2 29 07/16/2019 1430   BUN 16 07/16/2019 1430   CREATININE 0.95 07/16/2019 1430      Component Value Date/Time   CALCIUM 9.1 07/16/2019 1430   ALKPHOS 237 (H) 07/16/2019 1430   AST 300 (HH) 07/16/2019 1430   ALT 41 07/16/2019 1430   BILITOT 0.4 07/16/2019 1430

## 2019-07-16 NOTE — Patient Instructions (Signed)
Tunneled Central Venous Catheter Flushing Guide  It is important to flush your tunneled central venous catheter each time you use it, both before and after you use it. Flushing your catheter will help prevent it from clogging. What are the risks? Risks may include:  Infection.  Air getting into the catheter and bloodstream. Supplies needed:  A clean pair of gloves.  A disinfecting wipe. Use an alcohol wipe, chlorhexidine wipe, or iodine wipe as told by your health care provider.  A 10 mL syringe that has been prefilled with saline solution.  An empty 10 mL syringe, if a substance called heparin was injected into your catheter. How to flush your catheter When you flush your catheter, make sure you follow any specific instructions from your health care provider or the manufacturer. These are general guidelines. Flushing your catheter before use If there is heparin in your catheter: 1. Wash your hands with soap and water. 2. Put on gloves. 3. Scrub the injection cap for a minimum of 15 seconds with a disinfecting wipe. 4. Unclamp the catheter. 5. Attach the empty syringe to the injection cap. 6. Pull the syringe plunger back and withdraw 10 mL of blood. 7. Place the syringe into an appropriate waste container. 8. Scrub the injection cap for 15 seconds with a disinfecting wipe. 9. Attach the prefilled syringe to the injection cap. 10. Flush the catheter by pushing the plunger forward until all the liquid from the syringe is in the catheter. 11. Remove the syringe from the injection cap. 12. Clamp the catheter. If there is no heparin in your catheter: 1. Wash your hands with soap and water. 2. Put on gloves. 3. Scrub the injection cap for 15 seconds with a disinfecting wipe. 4. Unclamp the catheter. 5. Attach the prefilled syringe to the injection cap. 6. Flush the catheter by pushing the plunger forward until 5 mL of the liquid from the syringe is in the catheter. 7. Pull back on  the syringe until you see blood in the catheter. 8. If you have been asked to collect any blood, follow your health care provider's instructions. Otherwise, flush the catheter with the rest of the solution from the syringe. 9. Remove the syringe from the injection cap. 10. Clamp the catheter.  Flushing your catheter after use 1. Wash your hands with soap and water. 2. Put on gloves. 3. Scrub the injection cap for 15 seconds with a disinfecting wipe. 4. Unclamp the catheter. 5. Attach the prefilled syringe to the injection cap. 6. Flush the catheter by pushing the plunger forward until all of the liquid from the syringe is in the catheter. 7. Remove the syringe from the injection cap. 8. Clamp the catheter. Problems and solutions  If blood cannot be completely cleared from the injection cap, you may need to have the injection cap replaced.  If the catheter is difficult to flush, use the pulsing method. The pulsing method involves pushing only a few milliliters of solution into the catheter at a time and pausing between pushes.  If you do not see blood in the catheter when you pull back on the syringe, change your body position, such as by raising your arms above your head. Take a deep breath and cough. Then, pull back on the syringe. If you still do not see blood, flush the catheter with a small amount of solution. Then, change positions again and take a breath or cough. Pull back on the syringe again. If you still do not see   blood, finish flushing the catheter and contact your health care provider. Do not use your catheter until your health care provider says it is okay. General tips  Have someone help you flush your catheter, if possible.  Do not force fluid through your catheter.  Do not use a syringe that is larger or smaller than 10 mL. Using a smaller syringe can make the catheter burst.  Do not use your catheter without flushing it first if it has heparin in it. Contact a health  care provider if:  You cannot see any blood in the catheter when you flush it before using it.  Your catheter is difficult to flush. Get help right away if:  You cannot flush the catheter.  The catheter leaks when you flush it or when there is fluid in it.  There are cracks or breaks in the catheter. Summary  It is important to flush your tunneled central venous catheter each time you use it, both before and after you use it.  Scrub the injection cap for 15 seconds with a disinfecting wipe before and after you flush it.  When you flush your catheter, make sure you follow any specific instructions from your health care provider or the manufacturer.  Get help right away if you cannot flush the catheter. This information is not intended to replace advice given to you by your health care provider. Make sure you discuss any questions you have with your health care provider. Document Released: 11/18/2011 Document Revised: 02/14/2019 Document Reviewed: 02/14/2019 Elsevier Patient Education  2020 [employer]  

## 2019-07-17 ENCOUNTER — Inpatient Hospital Stay: Payer: BC Managed Care – PPO

## 2019-07-17 ENCOUNTER — Ambulatory Visit: Payer: BC Managed Care – PPO | Admitting: Hematology

## 2019-07-17 MED ORDER — POLYETHYLENE GLYCOL 3350 17 G PO PACK
17.00 | PACK | ORAL | Status: DC
Start: ? — End: 2019-07-17

## 2019-07-17 MED ORDER — ACYCLOVIR 400 MG PO TABS
400.00 | ORAL_TABLET | ORAL | Status: DC
Start: 2019-07-17 — End: 2019-07-17

## 2019-07-17 MED ORDER — GABAPENTIN 300 MG PO CAPS
600.00 | ORAL_CAPSULE | ORAL | Status: DC
Start: 2019-07-17 — End: 2019-07-17

## 2019-07-17 MED ORDER — CARBOXYMETHYLCELLULOSE SODIUM 0.5 % OP SOLN
1.00 | OPHTHALMIC | Status: DC
Start: ? — End: 2019-07-17

## 2019-07-17 MED ORDER — MORPHINE SULFATE 15 MG PO TABS
15.00 | ORAL_TABLET | ORAL | Status: DC
Start: ? — End: 2019-07-17

## 2019-07-17 MED ORDER — ONDANSETRON HCL 8 MG PO TABS
8.00 | ORAL_TABLET | ORAL | Status: DC
Start: ? — End: 2019-07-17

## 2019-07-17 MED ORDER — SENNOSIDES-DOCUSATE SODIUM 8.6-50 MG PO TABS
1.00 | ORAL_TABLET | ORAL | Status: DC
Start: ? — End: 2019-07-17

## 2019-07-17 MED ORDER — FERROUS SULFATE 325 (65 FE) MG PO TABS
325.00 | ORAL_TABLET | ORAL | Status: DC
Start: 2019-07-18 — End: 2019-07-17

## 2019-07-17 MED ORDER — LORAZEPAM 0.5 MG PO TABS
0.50 | ORAL_TABLET | ORAL | Status: DC
Start: ? — End: 2019-07-17

## 2019-07-17 MED ORDER — ALLOPURINOL 300 MG PO TABS
300.00 | ORAL_TABLET | ORAL | Status: DC
Start: 2019-07-18 — End: 2019-07-17

## 2019-07-17 MED ORDER — GENERIC EXTERNAL MEDICATION
30.00 | Status: DC
Start: ? — End: 2019-07-17

## 2019-07-19 ENCOUNTER — Inpatient Hospital Stay: Payer: BC Managed Care – PPO

## 2019-07-20 ENCOUNTER — Inpatient Hospital Stay: Payer: BC Managed Care – PPO

## 2019-07-23 ENCOUNTER — Inpatient Hospital Stay: Payer: BC Managed Care – PPO

## 2019-07-23 ENCOUNTER — Other Ambulatory Visit: Payer: BC Managed Care – PPO

## 2019-08-06 ENCOUNTER — Other Ambulatory Visit: Payer: Self-pay | Admitting: Hematology

## 2019-08-06 DIAGNOSIS — C8338 Diffuse large B-cell lymphoma, lymph nodes of multiple sites: Secondary | ICD-10-CM

## 2019-08-13 IMAGING — CR DG CHEST 1V PORT
1 series · 1 of 1 positions shown · non-contrast
Comparison: Radiographs December 31, 2018.

CLINICAL DATA: Status post Port-A-Cath placement.

EXAM:
PORTABLE CHEST 1 VIEW

[chest ap]
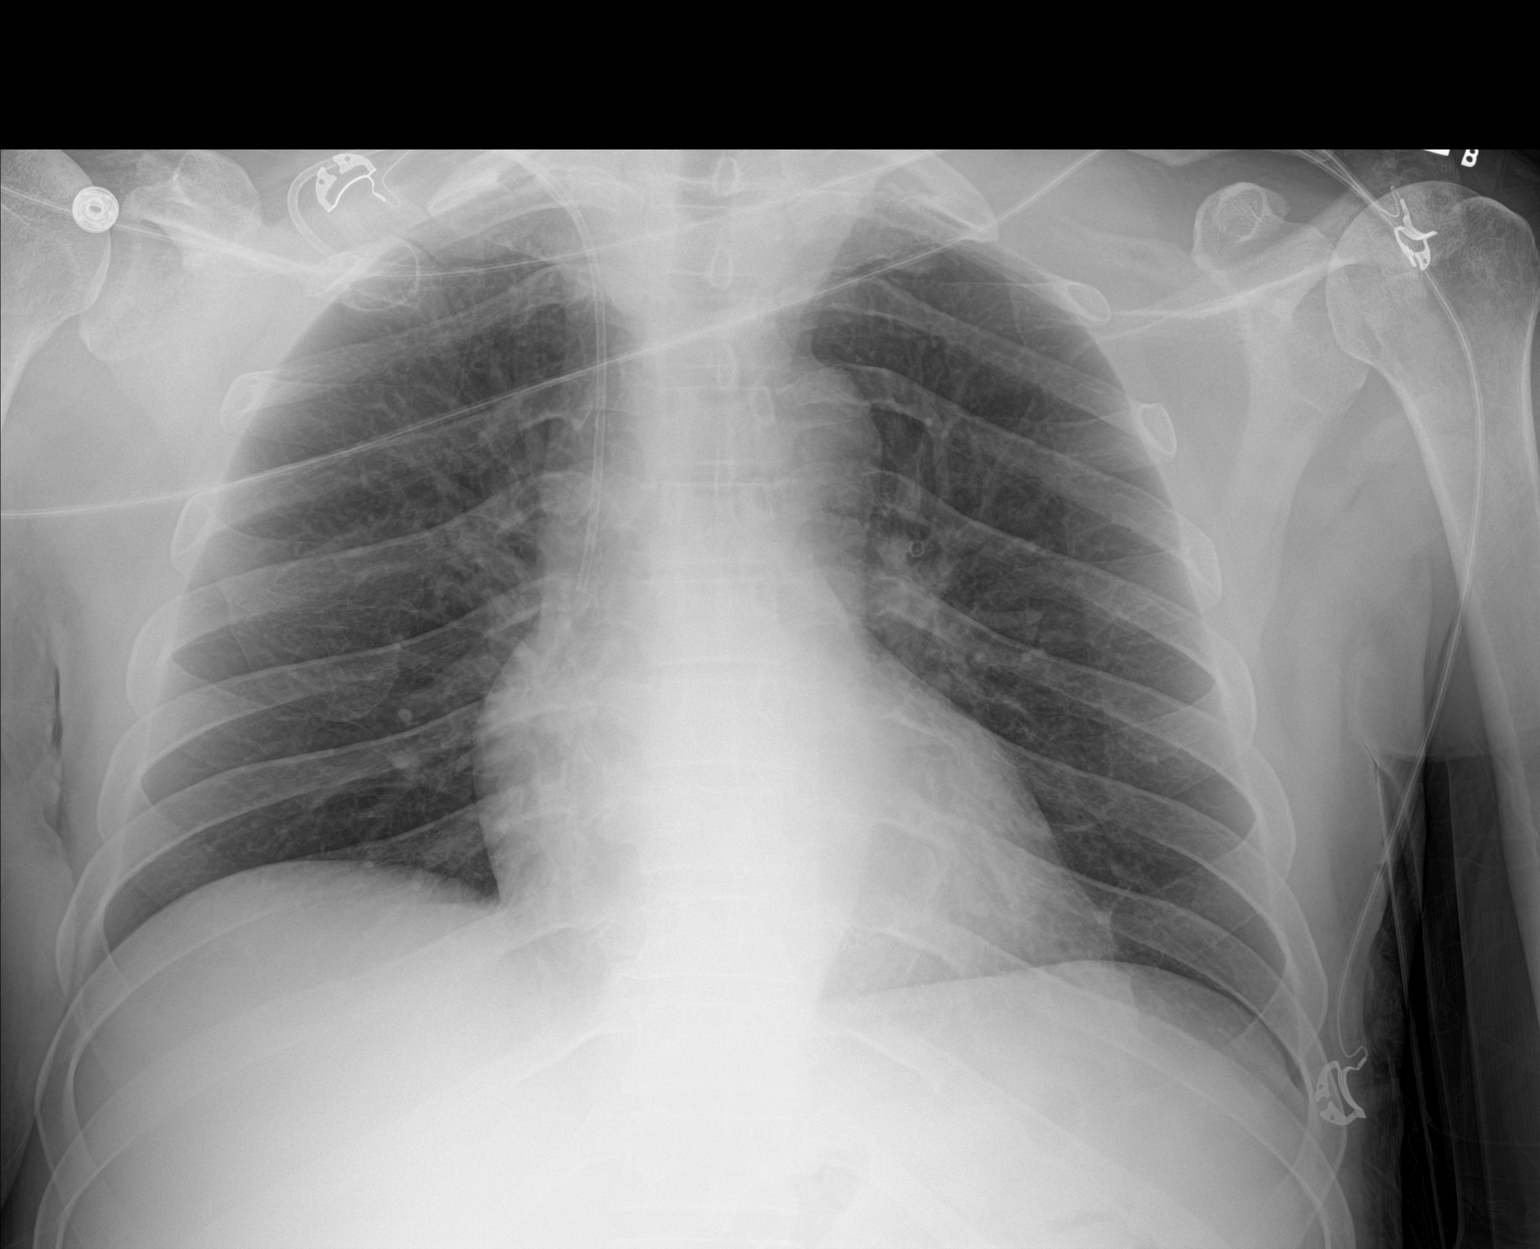

[1 of 1 positions shown; findings below may reference images not displayed]

FINDINGS: The heart size and mediastinal contours are within normal limits.
Both lungs are clear. Right subclavian Port-A-Cath is noted with
distal tip in expected position of the SVC. No pneumothorax or
pleural effusion is noted. The visualized skeletal structures are
unremarkable.
IMPRESSION: Status post right subclavian Port-A-Cath placement. No acute
cardiopulmonary abnormality seen.

## 2019-08-14 DEATH — deceased

## 2019-08-23 IMAGING — MR MR HEAD WO/W CM
10 of 13 series · 37 of 48 positions shown · IV contrast (gadavist)
Comparison: 12/31/2018 CT head.  01/15/2019 PET-CT.

CLINICAL DATA: 52 y/o M; diffuse large B-cell lymphoma, initial
workup staging.

EXAM:
MRI HEAD WITHOUT AND WITH CONTRAST
TECHNIQUE: Multiplanar, multiecho pulse sequences of the brain and surrounding
structures were obtained without and with intravenous contrast.
CONTRAST:  8 cc Gadavist

[Series 3: T1 · sagittal · 5.0mm · 0.47mm/px · 1 of 24 slices shown]
[im 1/24]
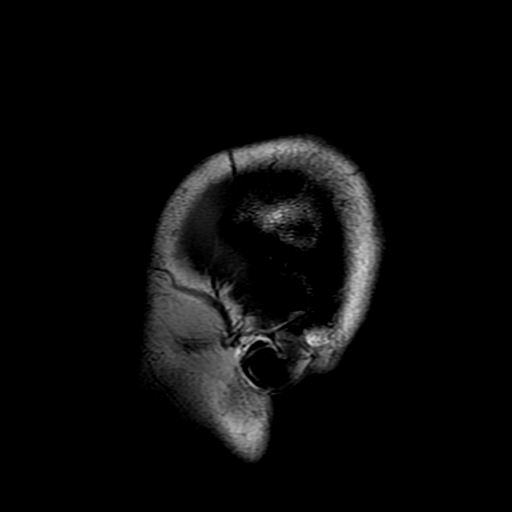

[Series 4: DWI · axial · 3.0mm · 1.09mm/px · z∈[-76,+80]mm · 8 of 106 slices shown (1 of 4)]
[im 1/106]
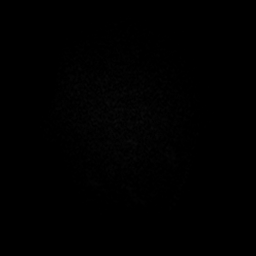
[im 16/106]
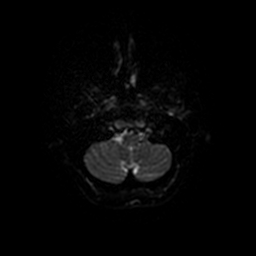
[im 31/106]
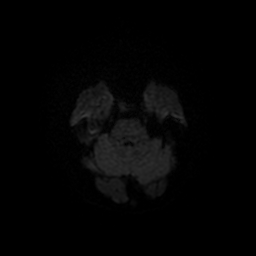
[im 46/106]
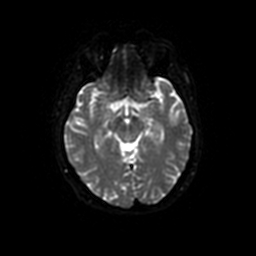
[im 61/106]
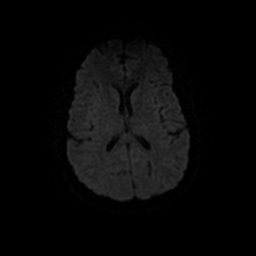
[im 76/106]
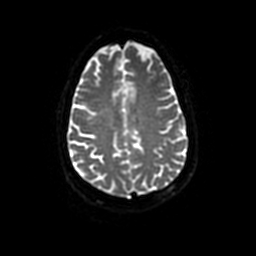
[im 91/106]
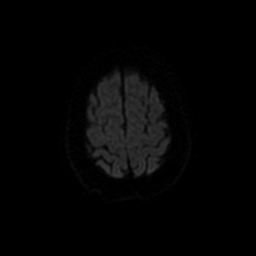
[im 106/106]
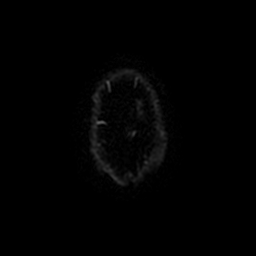

[Series 5: T2 · axial · 5.0mm · 0.45mm/px · z∈[-74,+80]mm · 2 of 23 slices shown]
[im 1/23]
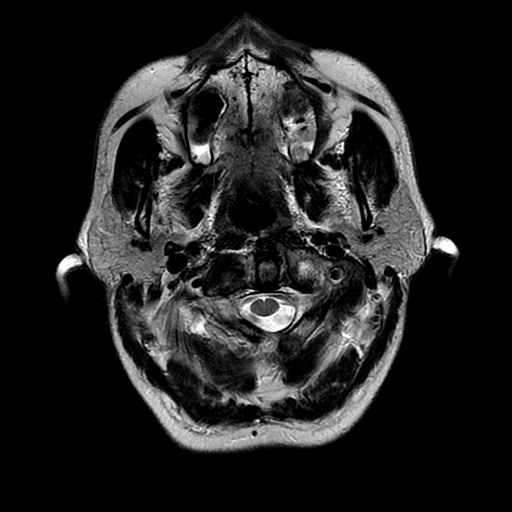
[im 23/23]
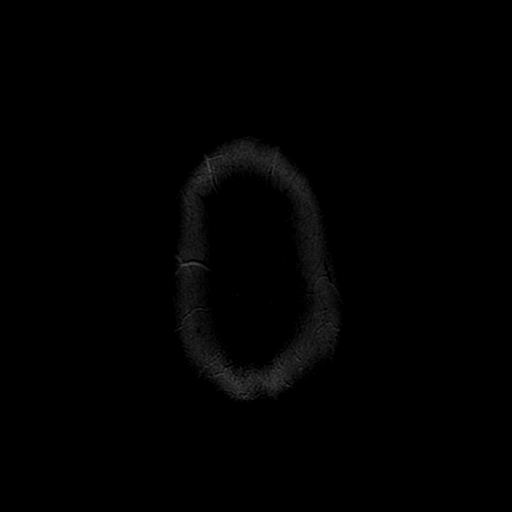

[Series 6: FLAIR · axial · 3.0mm · 0.45mm/px · z∈[-75,+81]mm · 2 of 27 slices shown]
[im 1/27]
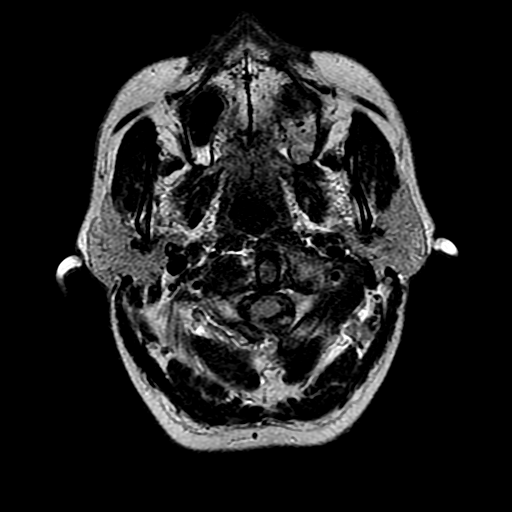
[im 27/27]
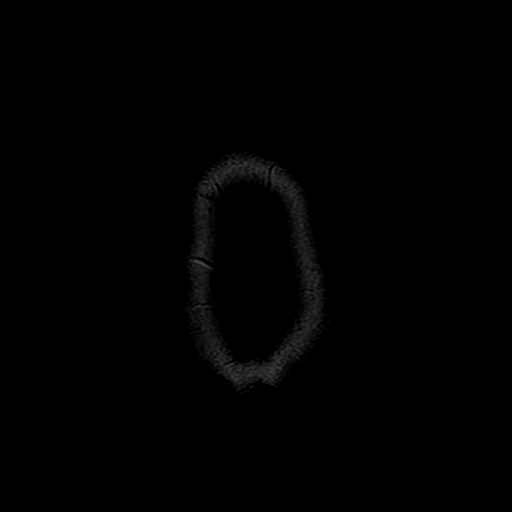

[Series 9: DWI · coronal · 3.0mm · 1.09mm/px · 9 of 124 slices shown (2 of 4)]
[im 1/124]
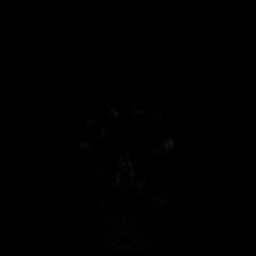
[im 16/124]
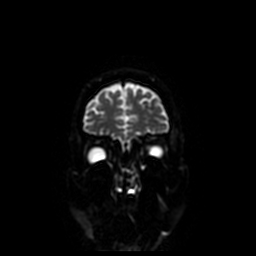
[im 31/124]
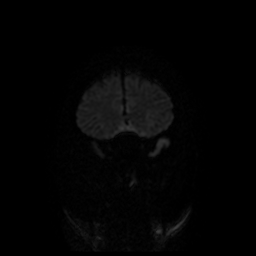
[im 47/124]
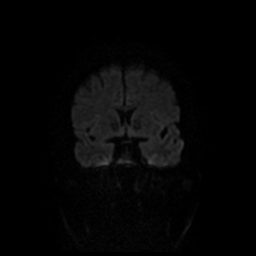
[im 62/124]
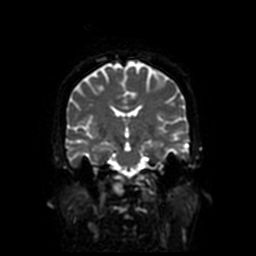
[im 77/124]
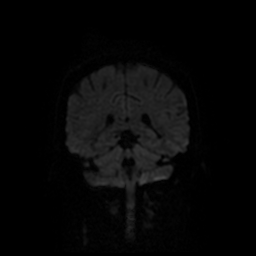
[im 93/124]
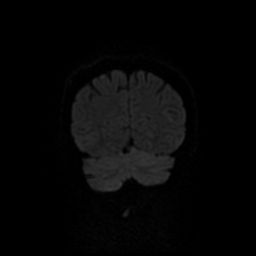
[im 108/124]
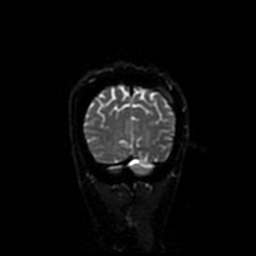
[im 124/124]
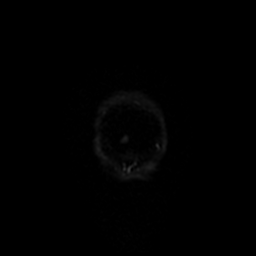

[Series 10: T1 post-contrast · sagittal · 5.0mm · 0.47mm/px · 2 of 24 slices shown (1 of 2)]
[im 1/24]
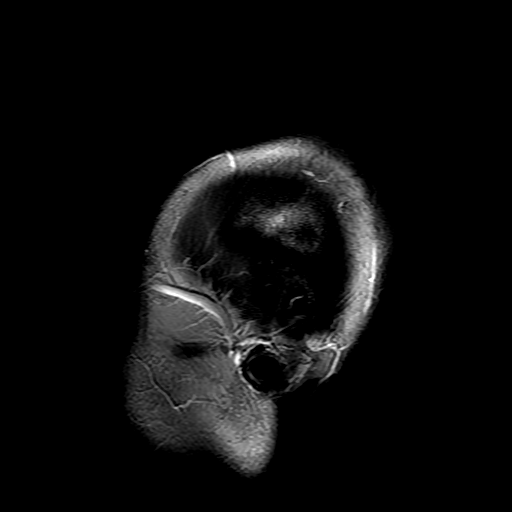
[im 24/24]
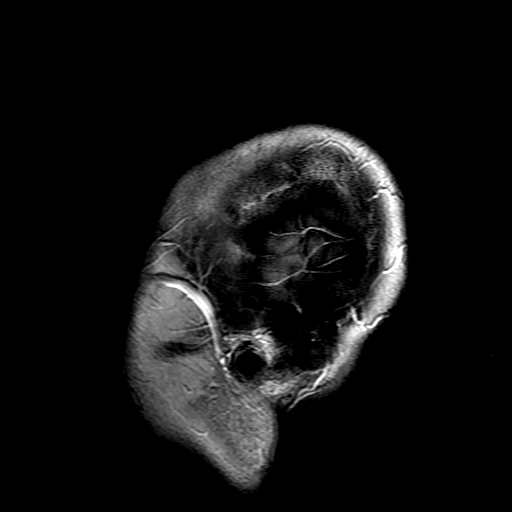

[Series 11: T2 post-contrast · coronal · 5.0mm · 0.45mm/px · 2 of 29 slices shown]
[im 1/29]
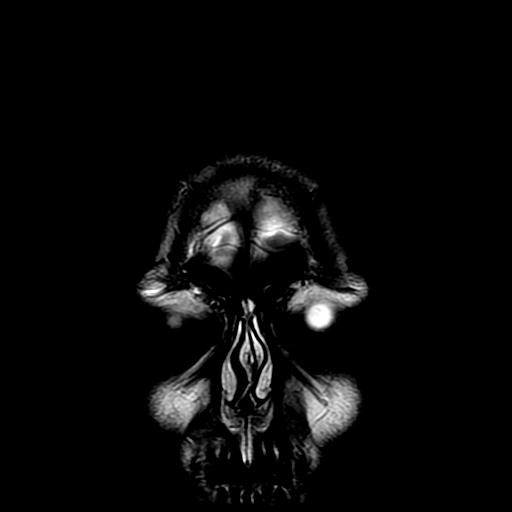
[im 29/29]
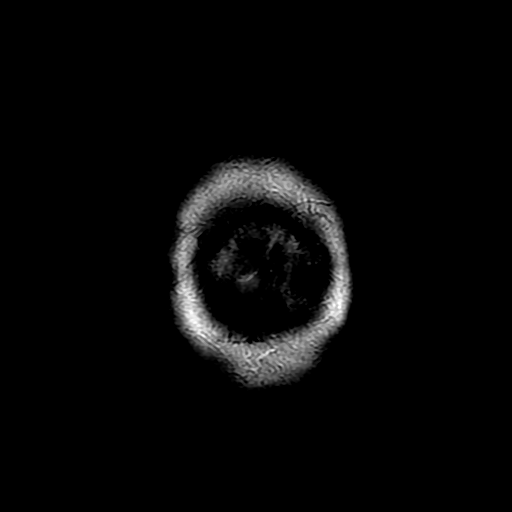

[Series 13: T1 post-contrast · coronal · 5.0mm · 0.45mm/px · 2 of 29 slices shown (2 of 2)]
[im 1/29]
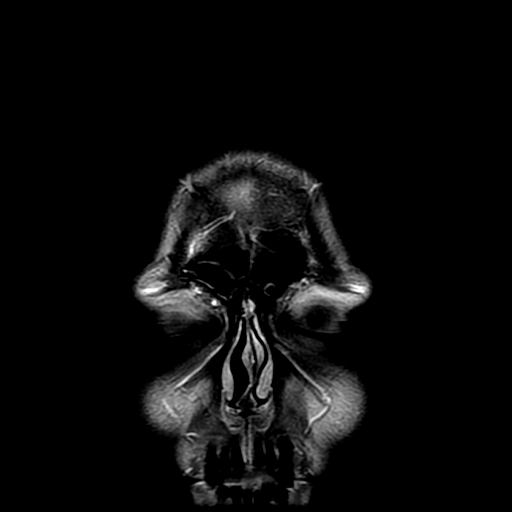
[im 29/29]
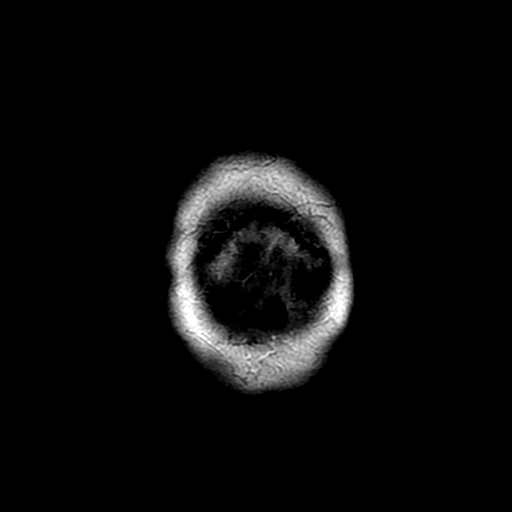

[Series 400: DWI · axial · 3.0mm · 1.09mm/px · z∈[-76,+80]mm · 4 of 53 slices shown (3 of 4)]
[im 1/53]
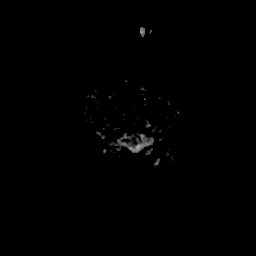
[im 18/53]
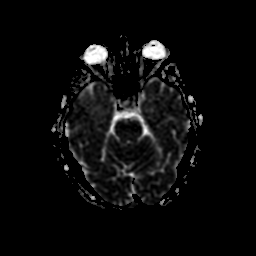
[im 35/53]
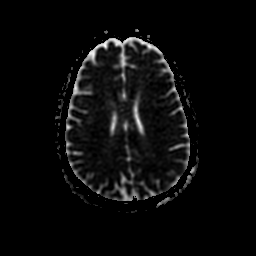
[im 53/53]
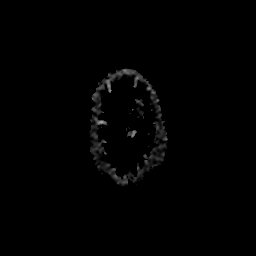

[Series 900: DWI · coronal · 3.0mm · 1.09mm/px · 5 of 62 slices shown (4 of 4)]
[im 1/62]
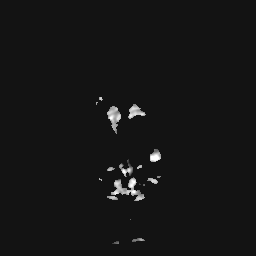
[im 16/62]
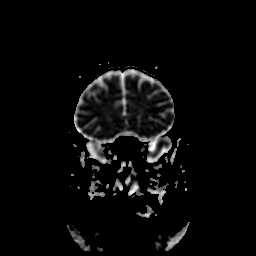
[im 31/62]
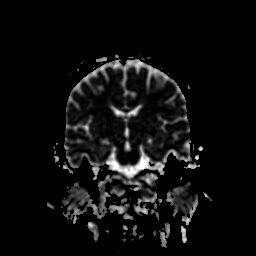
[im 46/62]
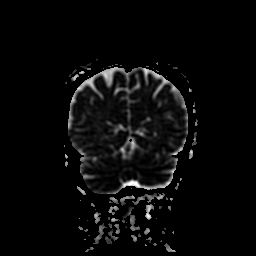
[im 62/62]
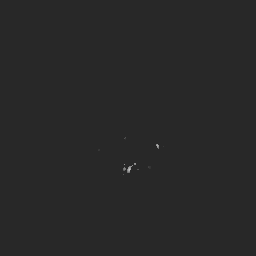

[37 of 48 positions shown; findings below may reference images not displayed]

FINDINGS: Brain: No acute infarction, hemorrhage, hydrocephalus, extra-axial
collection or mass lesion. No significant structural or signal
abnormality of the brain identified. After administration of
intravenous contrast there is NO abnormal enhancement.

Vascular: Normal flow voids.

Skull and upper cervical spine: T1 hypointense, diffusion
hyperintense, heterogeneously enhancing lesions in the left parietal
bone, clivus, left mandibular condyle, and visible cervical
vertebral bodies compatible with osseous metastatic disease.

Sinuses/Orbits: Negative.

Other: None.
IMPRESSION: 1. No intracranial metastasis identified.
2. Calvarium, skull base, and upper cervical osseous metastasis.

## 2019-08-26 IMAGING — RF DG FLUORO GUIDE LUMBAR PUNCTURE
1 series · 1 of 1 positions shown · non-contrast
Comparison: none

CLINICAL DATA: Lumbar puncture and intrathecal chemo
administration.

[Series 1: cp_standard · 0.25mm/px · 1 of 1 slices shown]
[im 1/1]
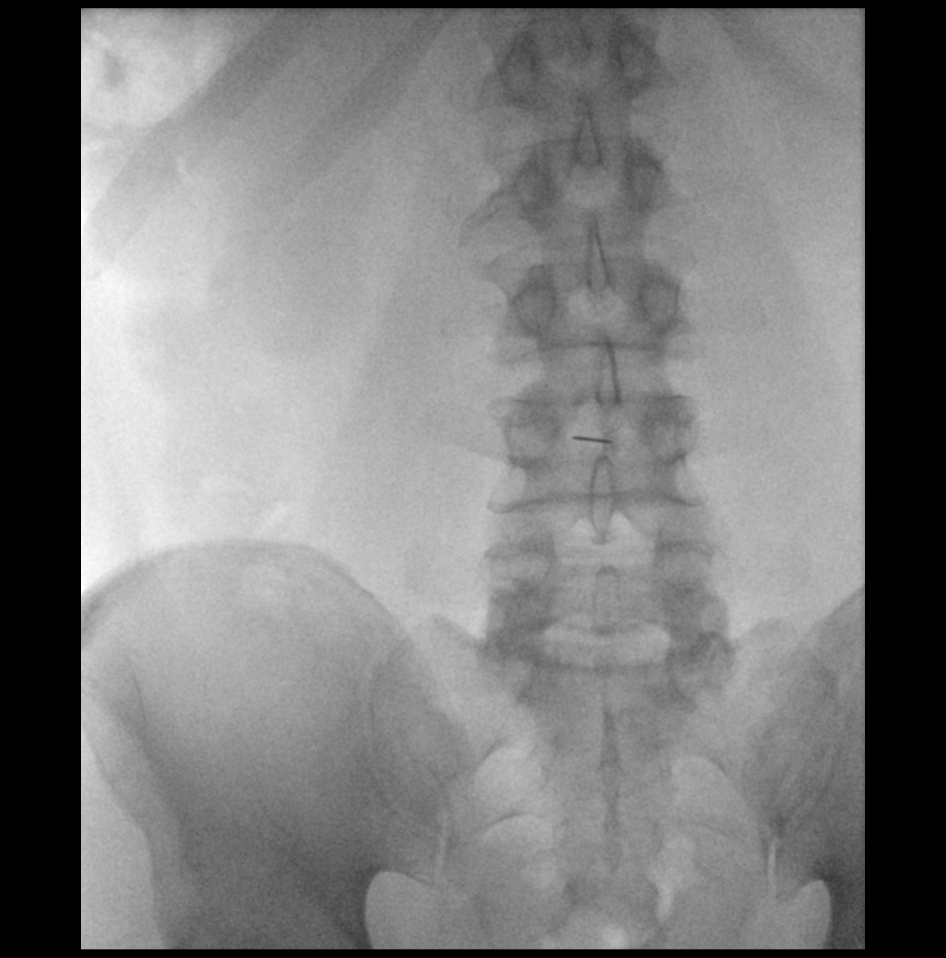

[1 of 1 positions shown; findings below may reference images not displayed]

EXAM:
FLUOROSCOPICALLY GUIDED LUMBAR PUNCTURE FOR INTRATHECAL CHEMOTHERAPY

FLUOROSCOPY TIME:  0.2 minutes

PROCEDURE:
Informed consent was obtained from the patient prior to the
procedure, including potential complications of headache, allergy,
and pain. With the patient prone, the lower back was prepped with
Betadine. 1% Lidocaine was used for local anesthesia. Lumbar
puncture was performed at the L3-4 level using a gauge needle with
return of clearCSF. 10 cc of clear CSF was obtained and sent for
labs. 5 mL of methotrexate solution was injected into the
subarachnoid space. The patient tolerated the procedure well without
apparent complication.
IMPRESSION: Lumbar puncture and intrathecal chemo administration as above.

## 2019-10-05 ENCOUNTER — Other Ambulatory Visit: Payer: Self-pay | Admitting: Hematology

## 2019-10-05 DIAGNOSIS — C8338 Diffuse large B-cell lymphoma, lymph nodes of multiple sites: Secondary | ICD-10-CM

## 2019-10-05 DIAGNOSIS — M79604 Pain in right leg: Secondary | ICD-10-CM

## 2021-06-12 ENCOUNTER — Encounter: Payer: Self-pay | Admitting: Hematology
